# Patient Record
Sex: Female | Born: 1980 | Race: Black or African American | Hispanic: No | Marital: Single | Smoking: Current every day smoker
Health system: Southern US, Academic
[De-identification: ages and names within clinical notes are randomized; demographics above are authoritative.]

## PROBLEM LIST (undated history)

## (undated) DIAGNOSIS — I10 Essential (primary) hypertension: Secondary | ICD-10-CM

## (undated) DIAGNOSIS — R569 Unspecified convulsions: Secondary | ICD-10-CM

## (undated) DIAGNOSIS — O09293 Supervision of pregnancy with other poor reproductive or obstetric history, third trimester: Secondary | ICD-10-CM

## (undated) DIAGNOSIS — F410 Panic disorder [episodic paroxysmal anxiety] without agoraphobia: Secondary | ICD-10-CM

## (undated) DIAGNOSIS — E663 Overweight: Secondary | ICD-10-CM

## (undated) DIAGNOSIS — O99213 Obesity complicating pregnancy, third trimester: Secondary | ICD-10-CM

## (undated) DIAGNOSIS — F419 Anxiety disorder, unspecified: Secondary | ICD-10-CM

## (undated) DIAGNOSIS — Z789 Other specified health status: Secondary | ICD-10-CM

## (undated) DIAGNOSIS — F1910 Other psychoactive substance abuse, uncomplicated: Secondary | ICD-10-CM

## (undated) HISTORY — DX: Other psychoactive substance abuse, uncomplicated: F19.10

## (undated) HISTORY — DX: Other specified health status: Z78.9

## (undated) HISTORY — DX: Anxiety disorder, unspecified: F41.9

## (undated) HISTORY — PX: CYST REMOVAL: SHX22

## (undated) HISTORY — DX: Unspecified convulsions: R56.9

## (undated) HISTORY — DX: Obesity complicating pregnancy, third trimester: O99.213

## (undated) HISTORY — PX: HX BREAST BIOPSY: SHX20

## (undated) HISTORY — DX: Supervision of pregnancy with other poor reproductive or obstetric history, third trimester: O09.293

## (undated) HISTORY — DX: Overweight: E66.3

## (undated) HISTORY — PX: HX CYST INCISION AND DRAINAGE: SHX14

---

## 2002-05-06 ENCOUNTER — Emergency Department: Payer: Self-pay

## 2002-09-03 ENCOUNTER — Emergency Department: Payer: Self-pay

## 2004-03-22 ENCOUNTER — Ambulatory Visit: Payer: Self-pay

## 2004-03-23 ENCOUNTER — Ambulatory Visit: Payer: Self-pay

## 2004-04-13 ENCOUNTER — Ambulatory Visit: Payer: Self-pay

## 2004-04-14 ENCOUNTER — Ambulatory Visit: Payer: Self-pay

## 2004-04-17 ENCOUNTER — Emergency Department: Payer: Self-pay

## 2009-11-28 ENCOUNTER — Emergency Department: Admit: 2009-11-28 | Discharge: 2009-11-28 | Disposition: A | Discharge status: Home / Self Care | Payer: Self-pay

## 2009-12-02 ENCOUNTER — Ambulatory Visit: Admission: RE | Admit: 2009-12-02 | Payer: Self-pay | Source: Ambulatory Visit | Admitting: EXTERNAL

## 2009-12-02 ENCOUNTER — Other Ambulatory Visit: Payer: Self-pay

## 2009-12-07 LAB — HISTORICAL SURGICAL PATHOLOGY SPECIMEN

## 2010-01-13 ENCOUNTER — Ambulatory Visit: Admission: RE | Admit: 2010-01-13 | Payer: Self-pay | Source: Ambulatory Visit | Admitting: EXTERNAL

## 2010-03-08 ENCOUNTER — Emergency Department: Admit: 2010-03-08 | Discharge status: Absent without leave | Payer: Self-pay

## 2010-03-09 ENCOUNTER — Ambulatory Visit: Admission: RE | Admit: 2010-03-09 | Payer: Self-pay | Source: Ambulatory Visit

## 2010-04-24 ENCOUNTER — Emergency Department
Admit: 2010-04-24 | Discharge: 2010-04-24 | Disposition: A | Discharge status: Home / Self Care | Payer: Self-pay | Attending: EMERGENCY MEDICINE-ST JOESPH'S | Admitting: EMERGENCY MEDICINE-ST JOESPH'S

## 2010-06-21 ENCOUNTER — Emergency Department
Admit: 2010-06-21 | Discharge: 2010-06-21 | Disposition: A | Discharge status: Home / Self Care | Payer: Self-pay | Attending: EMERGENCY MEDICINE-ST JOESPH'S | Admitting: EMERGENCY MEDICINE-ST JOESPH'S

## 2010-09-09 ENCOUNTER — Emergency Department: Admit: 2010-09-09 | Discharge: 2010-09-09 | Disposition: A | Discharge status: Home / Self Care | Payer: Self-pay

## 2010-10-20 ENCOUNTER — Ambulatory Visit: Admission: RE | Admit: 2010-10-20 | Payer: Self-pay | Source: Ambulatory Visit | Admitting: EXTERNAL

## 2010-10-20 ENCOUNTER — Other Ambulatory Visit: Payer: Self-pay

## 2010-10-21 ENCOUNTER — Emergency Department: Admit: 2010-10-21 | Discharge: 2010-10-21 | Disposition: A | Discharge status: Home / Self Care | Payer: Self-pay

## 2010-10-24 LAB — HISTORICAL SURGICAL PATHOLOGY SPECIMEN

## 2011-02-12 ENCOUNTER — Emergency Department: Admit: 2011-02-12 | Discharge: 2011-02-12 | Disposition: A | Discharge status: Home / Self Care | Payer: Self-pay

## 2011-02-14 ENCOUNTER — Emergency Department: Admit: 2011-02-14 | Discharge: 2011-02-14 | Disposition: A | Discharge status: Home / Self Care | Payer: Self-pay

## 2011-05-22 ENCOUNTER — Emergency Department
Admission: EM | Admit: 2011-05-22 | Disposition: A | Discharge status: Home / Self Care | Payer: Self-pay | Source: Ambulatory Visit

## 2011-06-05 ENCOUNTER — Emergency Department: Admit: 2011-06-05 | Discharge: 2011-06-05 | Disposition: A | Discharge status: Home / Self Care | Payer: Self-pay

## 2011-06-08 ENCOUNTER — Ambulatory Visit (INDEPENDENT_AMBULATORY_CARE_PROVIDER_SITE_OTHER): Payer: Self-pay | Admitting: GENERAL SURGERY

## 2011-12-04 ENCOUNTER — Emergency Department: Admit: 2011-12-04 | Disposition: A | Discharge status: Home / Self Care | Payer: Self-pay | Source: Ambulatory Visit

## 2011-12-10 ENCOUNTER — Emergency Department
Admission: EM | Admit: 2011-12-10 | Discharge: 2011-12-10 | Disposition: A | Discharge status: Home / Self Care | Payer: MEDICAID | Attending: Emergency Medicine | Admitting: Emergency Medicine

## 2011-12-10 LAB — URINALYSIS - JMC ONLY
BILIRUBIN,URINE: NEGATIVE mg/dL
BLOOD,URINE: NEGATIVE
GLUCOSE,URINE: NORMAL mg/dL
KETONE, URINE: NEGATIVE mg/dL
LEUKOCYTE ESTERACE,URINE: NEGATIVE
NITRITES,URINE: NEGATIVE
PH,URINE: 6.5 (ref 5.0–7.5)
PROTEIN,URINE: NEGATIVE mg/dL
SPECIFIC GRAVITY,URINE: 1.015 (ref 1.005–1.020)
UROBILINOGEN,URINE: NORMAL mg/dL (ref 0.2–1.0)

## 2011-12-10 LAB — CBC
BASOPHIL #: 0.15 K/uL — ABNORMAL HIGH (ref 0.0–0.10)
BASOPHILS %: 2.3 % (ref 0–2.50)
EOSINOPHIL #: 0.18 K/uL (ref 0.00–0.50)
EOSINOPHIL %: 2.8 % (ref 0.0–5.2)
HCT: 36.9 % (ref 36.0–48.0)
HGB: 11.5 g/dL — ABNORMAL LOW (ref 12.0–16.0)
LYMPHOCYTE #: 3.16 K/uL (ref 0.7–3.20)
LYMPHOCYTE %: 48.3 % — ABNORMAL HIGH (ref 15.0–43.0)
MCH: 21.6 pg — ABNORMAL LOW (ref 28.3–34.3)
MCHC: 31.2 g/dL — ABNORMAL LOW (ref 32.0–36.0)
MCV: 69.2 fL — AB (ref 82.0–100.0)
MONOCYTE #: 0.52 K/uL (ref 0.20–0.90)
MONOCYTE %: 8 % (ref 4.8–12.0)
MPV: 8.1 fL (ref 7.4–10.45)
PLATELET COUNT: 323 K/uL (ref 150–400)
PMN #: 2.53 K/uL (ref 1.5–6.5)
PMN %: 38.7 % — ABNORMAL LOW (ref 43.0–76.0)
RBC: 5.33 M/uL (ref 4.0–5.6)
RDW: 15.4 % — AB (ref 11.0–16.0)
WBC: 6.5 K/uL — AB (ref 4.0–11.0)

## 2011-12-10 LAB — BASIC METABOLIC PROFILE - BMC/JMC ONLY
ANION GAP: 6 mmol/L (ref 3–11)
BUN: 12 mg/dL (ref 6–20)
CALCIUM: 10.1 mg/dL (ref 8.6–10.3)
CARBON DIOXIDE: 25 mmol/L (ref 22–32)
CHLORIDE: 103 mmol/L (ref 101–111)
CREATININE: 0.51 mg/dL (ref 0.44–1.00)
ESTIMATED GLOMERULAR FILTRATION RATE: >60 mL/min (ref >60)
GLUCOSE: 88 mg/dL (ref 70–110)
POTASSIUM: 4.4 mmol/L (ref 3.4–5.1)
SODIUM: 134 mmol/L — ABNORMAL LOW (ref 136–145)

## 2011-12-10 LAB — RBC MORPHOLOGY - JMC ONLY: PLATELET ESTIMATE: ADEQUATE

## 2011-12-10 LAB — HCG,QUANT - JMC ONLY: HCG QUANT: 12751 m[IU]/mL — ABNORMAL HIGH (ref <5)

## 2011-12-10 LAB — LIPASE: LIPASE: 23 U/L (ref 22–51)

## 2011-12-10 LAB — HCG QUAL URINE - JMC ONLY: HCG QUAL: POSITIVE — AB

## 2011-12-10 MED ORDER — SODIUM CHLORIDE 0.9 % (FLUSH) INJECTION SYRINGE
10.0000 mL | INJECTION | Freq: Once | INTRAMUSCULAR | Status: DC
Start: 2011-12-10 — End: 2011-12-10

## 2011-12-10 MED ORDER — SODIUM CHLORIDE 0.9 % INTRAVENOUS SOLUTION
INTRAVENOUS | Status: DC
Start: 2011-12-10 — End: 2011-12-10

## 2011-12-10 NOTE — ED Nurses Note (Signed)
Ambulated to room # 3. Report/care to [employer].

## 2011-12-10 NOTE — ED Provider Notes (Signed)
HPI Comments: PT C/O 3 DAYS HX PESSURE IN SDES OF ABD. PT STATES + HOME PREG. APPOINT. WITH DR Larene Beach ON 19TH. NO VAGINAL BLEED/D/C    Patient is a 30 y.o. female presenting with abdominal pain. The history is provided by the patient.   Abdominal Pain   This is a new problem. The current episode started more than 2 days ago (x 3 days). The problem has not changed since onset.Pain location: "Along sides" The quality of the pain is pressure-like. The pain is at a severity of 6/10. The pain is moderate. Pertinent negatives include fever, diarrhea, nausea, vomiting, dysuria and headaches. The symptoms are aggravated by certain positions. The symptoms are relieved by certain positions.     30 Y/O FEMALE C/P OF ABDOMINAL "PRESSURE" X 3 DAYS. PT STS THAT SHE FEELS PRESSURE AND "PULLING PAIN" ALONG THE SIDES OF HER ABDOMEN. PT STS THAT THE PAIN IS WORSENED BY COUGHING, DEEP BREATHING, AND BY CERTAIN POSITIONS. PT STS THAT SHE IS PREGNANT BUT HAS NOT YET SEEN AN OBGYN, PT REPORTS THAT HER LAST PERIOD WAS ON 11/7. PT DENIES ANY VAGINAL BLEEDING OR DISCHARGE. PT ALSO DENIES ANY FEVER, NAUSEA, VOMITING, DIARRHEA, OR DYSURIA.     Pt is [id number]    Review of Systems   Constitutional: Negative for fever and diaphoresis.   HENT: Positive for congestion (Moderate.). Negative for sore throat and neck pain.    Respiratory: Positive for cough (Moderate.). Negative for shortness of breath.    Cardiovascular: Negative for chest pain.   Gastrointestinal: Positive for abdominal pain (Moderate. x 3 days. "pressure-like"). Negative for nausea, vomiting and diarrhea.   Genitourinary: Negative for dysuria, vaginal bleeding and vaginal discharge. Flank pain: Moderate. x 3 days. Bilateral sides. "Pressure-like.   Musculoskeletal: Negative for back pain.   Skin: Negative for rash.   Neurological: Negative for dizziness, syncope, weakness, light-headedness and headaches.   Psychiatric/Behavioral: Negative for confusion.    All other systems reviewed and are negative.      Past Medical History:  History reviewed. No pertinent past medical history.    Allergies:  Penicillins and Sulfa (sulfonamides)    Past Surgical History:  Past Surgical History   Procedure Date   . Hx breast biopsy        Social History:  History   Substance Use Topics   . Smoking status: Not on file   . Smokeless tobacco: Not on file   . Alcohol Use: No     History   Drug Use No       Family History:  No family history on file.    Home Meds: The patient's home medications have been reviewed.    Physical Exam  BP 138/87   Pulse 81   Temp 37 C (98.6 F)   Resp 20   Ht 1.702 m (5\' 7" )   Wt 86.183 kg (190 lb)   BMI 29.76 kg/m2   SpO2 99%   LMP 11/01/2011  Constitutional:  Well developed, well nourished.  Awake & alert. No distress.  Head:  Atraumatic.  Normocephalic.    Eyes:  PERRL.  EOMI.  Conjunctivae are not pale.  ENT:  Mucous membranes are moist and intact.  Oropharynx is clear and symmetric.  Patent airway.  Neck:  Supple.  Full ROM.  No JVD.  No lymphadenopathy.  Cardiovascular:  Regular rate.  Regular rhythm.  No murmurs, rubs, or gallops.  Distal pulses are 2+ and symmetric.  Pulmonary/Chest:  No evidence of respiratory distress.  Clear to auscultation bilaterally.  No wheezing, rales or rhonchi. Chest non-tender.  Abdominal:  Soft and non-distended.  There is mild bilateral lower pelvic pain.  No rebound, guarding, or rigidity.  No organomegaly.  Good bowel sounds.  GU: No blood to vault; No uterine or adnexal TTP    Back:  No CVA tenderness. FROM.   Extremities:  No edema.   No cyanosis.  No clubbing.  Full range of motion in all extremities.  No calf tenderness.  Skin:  Skin is warm and dry.  No diaphoresis. No rash.   Neurological:  Alert, awake, and appropriate.  Normal speech.  Sensation normal. Motor strengths 5/5.    Psychiatric:  Good eye contact.  Normal interaction, affect, and behavior.      Course   Results for orders placed during the hospital encounter of 12/10/11 (from the past 12 hour(s))   UA W/REFLEX - [facility] ONLY       Component Value Range    SOURCE, URINE CC      COLOR, URINE yellow  YELLOW    APPEARANCE,URINE clear  CLEAR    GLUCOSE,URINE normal  NEGATIVE mg/dL    BILIRUBIN,URINE negative  NEGATIVE mg/dL    KETONE, URINE negative  NEGATIVE mg/dL    SPECIFIC GRAVITY,URINE 1.015  1.005 - 1.020    BLOOD,URINE negative  NEGATIVE    PH,URINE 6.5  5.0 - 7.5    PROTEIN,URINE negative  NEGATIVE mg/dL    UROBILINOGEN,URINE normal  0.2 - 1.0 mg/dL    NITRITES,URINE negative  NEGATIVE    LEUKOCYTE ESTERACE,URINE negative  NEGATIVE   HCG QUAL URINE - [facility] ONLY       Component Value Range    HCG QUAL POSITIVE (*) NEGATIVE   CBC       Component Value Range    WBC 6.5 (*) 4.0 - 11.0 K/uL    RBC 5.33  4.0 - 5.6 M/uL    HGB 11.5 (*) 12.0 - 16.0 g/dL    HCT 16.1  09.6 - 04.5 %    MCV 69.2 (*) 82.0 - 100.0 fL    MCH 21.6 (*) 28.3 - 34.3 pg    MCHC 31.2 (*) 32.0 - 36.0 g/dL    RDW 40.9 (*) 81.1 - 16.0 %    PLATELET COUNT 323 (*) 150 - 400 K/uL    MPV 8.1  7.4 - 10.45 fL    PMN % 38.7 (*) 43.0 - 76.0 %    LYMPHOCYTE % 48.3 (*) 15.0 - 43.0 %    MONOCYTE % 8.0  4.8 - 12.0 %    EOSINOPHIL % 2.8  0.0 - 5.2 %    BASOPHILS % 2.3  0 - 2.50 %    PMN # 2.53  1.5 - 6.5 K/uL    LYMPHOCYTE # 3.16  0.7 - 3.20 K/uL    MONOCYTE # 0.52  0.20 - 0.90 K/uL    EOSINOPHIL # 0.18  0.00 - 0.50 K/uL    BASOPHIL # 0.15 (*) 0.0 - 0.10 K/uL   LIPASE       Component Value Range    LIPASE 23  22 - 51 U/L   HCG,QUANT - [facility] ONLY       Component Value Range    HCG QUANT 12751 (*) <5 mIU/mL   BASIC METABOLIC PROFILE - [facility] ONLY       Component Value Range    GLUCOSE 88  70 - 110 mg/dL    BUN 12  6 - 20 mg/dL    CREATININE 1.61  0.96 - 1.00 mg/dL    ESTIMATED GLOMERULAR FILTRATION RATE > 60  >60 ml/min    SODIUM 134 (*) 136 - 145 mmol/L    POTASSIUM 4.4  3.4 - 5.1 mmol/L    CHLORIDE 103  101 - 111 mmol/L    CARBON DIOXIDE 25  22 - 32 mmol/L     ANION GAP 6  3 - 11 mmol/L    CALCIUM 10.1  8.6 - 10.3 mg/dL   RBC MORPHOLOGY - [facility] ONLY       Component Value Range    PLATELET ESTIMATE ADEQUATE  ADEQUATE    HYPO 1+      MICROCYTOSIS 1+      TARGET CELLS 1+         ---------------------- MEDICAL DECISION MAKING -----------------------      Vital Signs: Reviewed the patient?s vital signs.   Pulse oximetry interpretation: 99% room air     Nursing Notes: Reviewed and utilized the nursing notes.      Old Medical Records: Reviewed    Laboratory Studies: Ordered and independently interpreted laboratory tests, see above.     Additional MDM and Provider Notes:   1722 - Patient comfortable and asymptomatic.  Will f/u with Dr. Dayton Scrape on 12/13/11    ----------------------------- COUNSELING ------------------------------     Counseling: The emergency provider has spoken with the patient and discussed today?s findings, in addition to providing specific details for the plan of care.  Questions are answered and there is agreement with the plan.      ----------------------- IMPRESSION AND DISPOSITION --------------------      CLINICAL IMPRESSION   First Trimester Pregnancy  Pelvic Pain - Resolved    DISPOSITION   Disposition: Home  Patient condition: Stable     SCRIBE ATTESTATION   This note is prepared by Luvenia Heller, acting as Scribe for Dr. Bluford Kaufmann     The scribe's documentation has been prepared under my direction and personally reviewed by me in its entirety.  I confirm that the note above accurately reflects all work, treatment, procedures, and medical decision making performed by me, Dr. Bluford Kaufmann

## 2011-12-10 NOTE — ED Nurses Note (Signed)
Pelvic set up at bedside earlier, assisted MD with pelvic exam patient tolerated well.  Received discharge home order, discharge papers rev'd and explained, signature obtained, pt encouraged to f/u with OB, pt states appointment on the 19th.  Pt escorted to d/c exit area

## 2011-12-10 NOTE — ED Nurses Note (Signed)
PT C/O 3 DAYS HX PESSURE IN SDES OF ABD. PT STATES + HOME PREG. APPOINT. WITH DR Larene Beach ON 19TH. NO VAGINAL BLEED/D/C

## 2011-12-13 ENCOUNTER — Encounter (INDEPENDENT_AMBULATORY_CARE_PROVIDER_SITE_OTHER): Payer: Self-pay

## 2011-12-13 ENCOUNTER — Ambulatory Visit (INDEPENDENT_AMBULATORY_CARE_PROVIDER_SITE_OTHER): Payer: MEDICAID

## 2011-12-13 VITALS — BP 148/76 | Wt 196.7 lb

## 2011-12-13 DIAGNOSIS — F1110 Opioid abuse, uncomplicated: Secondary | ICD-10-CM

## 2011-12-13 HISTORY — DX: Opioid abuse, uncomplicated: F11.10

## 2011-12-13 NOTE — Progress Notes (Signed)
 Patient was oriented to the practice. She was advised of the practice webpage and  [email or web address].. History and physical was accomplished.A thorough discussion was undertaken regarding the options for testing for aneuploidy in the first and second trimester. Please refer to the genetic screening questionnaire for additional information. Labs were ordered. Patient was advised of how to reach this office afterhours. New OB information was  provided to the patient.elevated bp noted will watch closely   Provided# for subutex  prescriber in [place]

## 2011-12-13 NOTE — Progress Notes (Signed)
Urine Results:  Urine HCG: Positive  Initials: VC    Haelyn Forgey A Taeshawn Helfman, [place] 12/13/2011, 3:54 PM

## 2011-12-14 ENCOUNTER — Other Ambulatory Visit
Admission: RE | Admit: 2011-12-14 | Discharge: 2011-12-14 | Disposition: A | Discharge status: Home / Self Care | Payer: MEDICAID

## 2011-12-14 DIAGNOSIS — Z113 Encounter for screening for infections with a predominantly sexual mode of transmission: Secondary | ICD-10-CM | POA: Insufficient documentation

## 2011-12-14 DIAGNOSIS — Z124 Encounter for screening for malignant neoplasm of cervix: Secondary | ICD-10-CM | POA: Insufficient documentation

## 2011-12-18 LAB — HISTORICAL CYTOPATHOLOGY-GYN (PAP AND HPV TESTS): Final Diagnosis: NEGATIVE

## 2011-12-20 ENCOUNTER — Other Ambulatory Visit (HOSPITAL_BASED_OUTPATIENT_CLINIC_OR_DEPARTMENT_OTHER): Payer: Self-pay

## 2011-12-24 ENCOUNTER — Emergency Department
Admission: EM | Admit: 2011-12-24 | Discharge: 2011-12-24 | Disposition: A | Discharge status: Home / Self Care | Payer: MEDICAID | Attending: Emergency Medicine | Admitting: Emergency Medicine

## 2011-12-24 DIAGNOSIS — N898 Other specified noninflammatory disorders of vagina: Secondary | ICD-10-CM | POA: Insufficient documentation

## 2011-12-24 DIAGNOSIS — O99330 Smoking (tobacco) complicating pregnancy, unspecified trimester: Secondary | ICD-10-CM | POA: Insufficient documentation

## 2011-12-24 DIAGNOSIS — O99891 Other specified diseases and conditions complicating pregnancy: Secondary | ICD-10-CM | POA: Insufficient documentation

## 2011-12-24 LAB — URINALYSIS - JMC ONLY
BILIRUBIN,URINE: NEGATIVE mg/dL
BLOOD,URINE: NEGATIVE
GLUCOSE,URINE: NORMAL mg/dL
KETONE, URINE: NEGATIVE mg/dL
LEUKOCYTE ESTERACE,URINE: NEGATIVE
NITRITES,URINE: NEGATIVE
PH,URINE: 6 (ref 5.0–7.5)
PROTEIN,URINE: NEGATIVE mg/dL
SPECIFIC GRAVITY,URINE: 1.02 (ref 1.005–1.020)
UROBILINOGEN,URINE: NORMAL mg/dL (ref 0.2–1.0)

## 2011-12-24 NOTE — ED Nurses Note (Signed)
 NAD at time of triage, VSS, denies pain

## 2011-12-24 NOTE — ED Provider Notes (Signed)
 HPI Comments: This is a 30 yo female who presents with concerns of two tiny spots on her underwear which were brown in color.  Denies vaginal bleeding; no pelvic pain;  No n/v/d.  No back pain.   States she is approximately [redacted] weeks pregnant.  Has appt with OB on 12/28/11.  Had first OB appt on 12/12/11.  Denies vaginal odor.    Patient is a 30 y.o. female presenting with vaginal discharge. The history is provided by the patient.   Vaginal Discharge   Pertinent negatives include no fever, no abdominal pain, no diarrhea, no nausea, no vomiting and no dysuria.           Review of Systems   Constitutional: Negative for fever and chills.   Respiratory: Negative for chest tightness.    Cardiovascular: Negative for chest pain.   Gastrointestinal: Negative for nausea, vomiting, abdominal pain and diarrhea.   Genitourinary: Positive for vaginal discharge. Negative for dysuria, hematuria, flank pain, vaginal bleeding and difficulty urinating.   Musculoskeletal: Negative for back pain.   Neurological: Negative.    Hematological: Negative.    Psychiatric/Behavioral: Negative.    All other systems reviewed and are negative.            History:   Past Medical History:  Past Medical History   Diagnosis Date   . Seizure      age 6    . Overweight    . 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 Hx breast biopsy    . Hx cyst incision and drainage LEFT       Social History:  History   Substance Use Topics   . Smoking status: Current Everyday Smoker -- 0.4 packs/day for 14 years     Types: Cigarettes   . Smokeless tobacco: Not on file   . Alcohol Use: No     History   Drug Use No     hx of opiate abuse  oxycodone10mg  q 4        Family History:  Family History   Problem Relation Age of Onset   . Diabetes Maternal Grandmother    . Diabetes Maternal Grandfather    . Healthy Father    . Healthy Mother    . Healthy Brother          Physical Exam   Nursing note and vitals  reviewed.  Constitutional: She is oriented to person, place, and time. She appears well-developed and well-nourished. No distress.   HENT:   Head: Normocephalic and atraumatic.   Eyes: Pupils are equal, round, and reactive to light.   Cardiovascular: Normal rate, regular rhythm and normal heart sounds.    Pulmonary/Chest: Effort normal and breath sounds normal.   Abdominal: Soft. Bowel sounds are normal. There is no tenderness.   Genitourinary: Vaginal discharge found.   Neurological: She is alert and oriented to person, place, and time.   Skin: Skin is warm and dry.   Psychiatric: She has a normal mood and affect.           ED Course:    Results:  Results for orders placed during the hospital encounter of 12/24/11 (from the past 12 hour(s))    -UA W/REFLEX - [facility] ONLY     SOURCE, URINE                 VOID  COLOR, URINE                  yellow  Range: YELLOW     APPEARANCE,URINE              clear   Range: CLEAR     GLUCOSE,URINE mg/dL           normal  Range: NEGATIVE mg/dL     BILIRUBIN,URINE mg/dL         negative Range: NEGATIVE mg/dL     KETONE, URINE mg/dL           negative Range: NEGATIVE mg/dL     SPECIFIC GRAVITY,URINE        1.020   Range: 1.005 - 1.020     BLOOD,URINE                   negative Range: NEGATIVE     PH,URINE                      6.0     Range: 5.0 - 7.5     PROTEIN,URINE mg/dL           negative Range: NEGATIVE mg/dL     UROBILINOGEN,URINE mg/dL      normal  Range: 0.2 - 1.0 mg/dL     NITRITES,URINE                negative Range: NEGATIVE     LEUKOCYTE ESTERACE,URINE      negative Range: NEGATIVE    ED Course:    Evaluation / Plan:          Dx:  Vaginal discharge / may be related to implantation  Discussed with patient.  Will follow up with OB/GYN in 3 days as scheduled.

## 2011-12-24 NOTE — Discharge Instructions (Signed)
Follow up with OB/GYN in 3 days as scheduled.  Return to ER with vaginal bleeding, or change in condition.

## 2011-12-24 NOTE — ED Nurses Note (Signed)
Pt discharged home with no Rx.  Pt verbalized understanding of discharge instructions.

## 2011-12-25 ENCOUNTER — Encounter (INDEPENDENT_AMBULATORY_CARE_PROVIDER_SITE_OTHER): Payer: Self-pay

## 2011-12-25 ENCOUNTER — Ambulatory Visit
Admission: RE | Admit: 2011-12-25 | Discharge: 2011-12-25 | Disposition: A | Discharge status: Home / Self Care | Payer: MEDICAID | Source: Ambulatory Visit

## 2011-12-25 DIAGNOSIS — Z3480 Encounter for supervision of other normal pregnancy, unspecified trimester: Secondary | ICD-10-CM | POA: Insufficient documentation

## 2011-12-27 LAB — RUBELLA VIRUS IGG AB: RUBELLA IGG: 3.35 Index (ref >=1.10)

## 2011-12-28 ENCOUNTER — Ambulatory Visit (INDEPENDENT_AMBULATORY_CARE_PROVIDER_SITE_OTHER): Payer: Self-pay

## 2011-12-28 ENCOUNTER — Encounter

## 2011-12-28 VITALS — BP 142/80 | Wt 203.9 lb

## 2011-12-28 NOTE — Progress Notes (Signed)
[place] for subutex 8mg  q d, no c/o, labs reviewed bp noted see sonogram Which revealed probable missed abortion. Will order confirmatory hospital-based sonogram.Patient desires suction D&C at first, opportunity she specifically declined Cytotec, and or conservative management. Advise call hospital for heavy vaginal bleeding or severe pain. 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 I encouraged patient  to continued Subutex.

## 2011-12-28 NOTE — Procedures (Signed)
 Transvaginal sonogram performed today due to unknown last menses. Exam today revealed a crl of  13.6mm  which equals with 7 weeks 5days  no fetal cardiac activity nor fetal movement.Ovaries were not seen .Will get formal sonogram for confirmation .

## 2011-12-29 ENCOUNTER — Other Ambulatory Visit (INDEPENDENT_AMBULATORY_CARE_PROVIDER_SITE_OTHER): Payer: Self-pay

## 2011-12-29 ENCOUNTER — Ambulatory Visit (HOSPITAL_BASED_OUTPATIENT_CLINIC_OR_DEPARTMENT_OTHER)
Admission: RE | Admit: 2011-12-29 | Discharge: 2011-12-29 | Disposition: A | Discharge status: Home / Self Care | Payer: MEDICAID | Source: Ambulatory Visit

## 2011-12-29 DIAGNOSIS — O021 Missed abortion: Secondary | ICD-10-CM | POA: Insufficient documentation

## 2011-12-29 LAB — HCG QUANT - BMC ONLY: HCG QUANT: 74949 m[IU]/mL — ABNORMAL HIGH (ref <5)

## 2012-01-03 ENCOUNTER — Ambulatory Visit
Admission: RE | Admit: 2012-01-03 | Discharge: 2012-01-03 | Disposition: A | Discharge status: Home / Self Care | Payer: MEDICAID | Source: Ambulatory Visit

## 2012-01-03 DIAGNOSIS — Z01812 Encounter for preprocedural laboratory examination: Secondary | ICD-10-CM | POA: Insufficient documentation

## 2012-01-03 DIAGNOSIS — O021 Missed abortion: Secondary | ICD-10-CM | POA: Insufficient documentation

## 2012-01-03 HISTORY — DX: Panic disorder (episodic paroxysmal anxiety): F41.0

## 2012-01-03 HISTORY — DX: Anxiety disorder, unspecified: F41.9

## 2012-01-03 HISTORY — DX: Essential (primary) hypertension: I10

## 2012-01-03 LAB — CBC
BASOPHIL #: 0.07 K/uL (ref 0.0–0.10)
BASOPHILS %: 1 % (ref 0–2.50)
EOSINOPHIL #: 0.35 K/uL (ref 0.00–0.50)
EOSINOPHIL %: 4.8 % (ref 0.0–5.2)
HCT: 36.2 % (ref 36.0–48.0)
HGB: 11.7 g/dL — ABNORMAL LOW (ref 12.0–16.0)
LYMPHOCYTE #: 2.67 K/uL (ref 0.7–3.20)
LYMPHOCYTE %: 36.8 % (ref 15.0–43.0)
MCH: 21.7 pg — ABNORMAL LOW (ref 28.3–34.3)
MCHC: 32.3 g/dL (ref 32.0–36.0)
MCV: 67 fL — ABNORMAL LOW (ref 82.0–100.0)
MONOCYTE #: 0.54 K/uL (ref 0.20–0.90)
MONOCYTE %: 7.5 % (ref 4.8–12.0)
MPV: 7.1 fL — ABNORMAL LOW (ref 7.4–10.45)
PLATELET COUNT: 300 K/uL (ref 150–400)
PMN #: 3.61 K/uL (ref 1.5–6.5)
PMN %: 49.9 % (ref 43.0–76.0)
RBC: 5.4 M/uL (ref 4.0–5.6)
RDW: 14.8 % (ref 11.0–16.0)
WBC: 7.2 K/uL (ref 4.0–11.0)

## 2012-01-03 LAB — RBC MORPHOLOGY - JMC ONLY: PLATELET ESTIMATE: ADEQUATE

## 2012-01-03 NOTE — Nurses Notes (Signed)
 Interview complete, pt arrived at hospital for interview. Pt given order for labs to be drawn. Pt to go to Dr [place] office to sign consent. Questions answered, arrival time given.

## 2012-01-04 ENCOUNTER — Inpatient Hospital Stay
Admission: RE | Admit: 2012-01-04 | Discharge: 2012-01-04 | Disposition: A | Discharge status: Home / Self Care | Payer: MEDICAID | Source: Ambulatory Visit

## 2012-01-04 ENCOUNTER — Ambulatory Visit (HOSPITAL_COMMUNITY): Payer: Self-pay

## 2012-01-04 ENCOUNTER — Ambulatory Visit (EMERGENCY_DEPARTMENT_HOSPITAL): Payer: MEDICAID

## 2012-01-04 ENCOUNTER — Other Ambulatory Visit: Payer: Self-pay

## 2012-01-04 ENCOUNTER — Encounter: Admission: RE | Disposition: A | Discharge status: Home / Self Care | Payer: Self-pay | Source: Ambulatory Visit

## 2012-01-04 DIAGNOSIS — F17200 Nicotine dependence, unspecified, uncomplicated: Secondary | ICD-10-CM | POA: Insufficient documentation

## 2012-01-04 DIAGNOSIS — F1110 Opioid abuse, uncomplicated: Secondary | ICD-10-CM | POA: Diagnosis present

## 2012-01-04 DIAGNOSIS — O021 Missed abortion: Secondary | ICD-10-CM | POA: Diagnosis present

## 2012-01-04 HISTORY — PX: HX DILATION AND CURETTAGE: SHX78

## 2012-01-04 SURGERY — DILATION AND SUCTION CURETTAGE

## 2012-01-04 MED ORDER — OXYTOCIN 10 UNIT/ML INJECTION SOLUTION
INTRAMUSCULAR | Status: AC
Start: 2012-01-04 — End: 2012-01-04
  Filled 2012-01-04: qty 2

## 2012-01-04 MED ORDER — HYDROMORPHONE 1 MG/ML INJECTION WRAPPER
INJECTION | INTRAMUSCULAR | Status: AC
Start: 2012-01-04 — End: 2012-01-04
  Filled 2012-01-04: qty 1

## 2012-01-04 MED ORDER — FENTANYL (PF) 50 MCG/ML INJECTION SOLUTION
INTRAMUSCULAR | Status: AC
Start: 2012-01-04 — End: 2012-01-04
  Filled 2012-01-04: qty 2

## 2012-01-04 MED ORDER — DOXYCYCLINE HYCLATE 100 MG CAPSULE
100.00 mg | ORAL_CAPSULE | Freq: Two times a day (BID) | ORAL | Status: AC
Start: 2012-01-04 — End: 2012-03-28

## 2012-01-04 MED ORDER — DEXAMETHASONE SODIUM PHOSPHATE 4 MG/ML INJECTION SOLUTION
INTRAMUSCULAR | Status: AC
Start: 2012-01-04 — End: 2012-01-04
  Filled 2012-01-04: qty 1

## 2012-01-04 MED ORDER — MIDAZOLAM 1 MG/ML INJECTION SOLUTION
INTRAMUSCULAR | Status: AC
Start: 2012-01-04 — End: 2012-01-04
  Filled 2012-01-04: qty 2

## 2012-01-04 MED ORDER — ONDANSETRON HCL (PF) 4 MG/2 ML INJECTION SOLUTION
INTRAMUSCULAR | Status: AC
Start: 2012-01-04 — End: 2012-01-04
  Filled 2012-01-04: qty 2

## 2012-01-04 MED ORDER — KETOROLAC 30 MG/ML (1 ML) INJECTION SOLUTION
INTRAMUSCULAR | Status: AC
Start: 2012-01-04 — End: 2012-01-04
  Filled 2012-01-04: qty 1

## 2012-01-04 MED ORDER — FENTANYL (PF) 50 MCG/ML INJECTION SOLUTION
25.00 ug | INTRAMUSCULAR | Status: DC | PRN
Start: 2012-01-04 — End: 2012-01-04

## 2012-01-04 MED ORDER — FENTANYL (PF) 50 MCG/ML INJECTION SOLUTION
INTRAMUSCULAR | Status: AC
Start: 2012-01-04 — End: 2012-01-04
  Filled 2012-01-04: qty 4

## 2012-01-04 MED ORDER — PROPOFOL 10 MG/ML INTRAVENOUS EMULSION
INTRAVENOUS | Status: AC
Start: 2012-01-04 — End: 2012-01-04
  Filled 2012-01-04: qty 20

## 2012-01-04 MED ORDER — IBUPROFEN 800 MG TABLET
800.00 mg | ORAL_TABLET | Freq: Three times a day (TID) | ORAL | Status: DC | PRN
Start: 2012-01-04 — End: 2013-10-25

## 2012-01-04 MED ORDER — HYDROCODONE 5 MG-ACETAMINOPHEN 325 MG TABLET
1.00 | ORAL_TABLET | ORAL | Status: DC | PRN
Start: 2012-01-04 — End: 2012-01-04

## 2012-01-04 MED ORDER — KETOROLAC 30 MG/ML (1 ML) INJECTION SOLUTION
30.00 mg | Freq: Four times a day (QID) | INTRAMUSCULAR | Status: DC
Start: 2012-01-04 — End: 2012-01-04

## 2012-01-04 MED ORDER — LACTATED RINGERS INTRAVENOUS SOLUTION
INTRAVENOUS | Status: DC
Start: 2012-01-04 — End: 2012-01-04

## 2012-01-04 MED ORDER — ONDANSETRON HCL (PF) 4 MG/2 ML INJECTION SOLUTION
4.00 mg | Freq: Once | INTRAMUSCULAR | Status: DC | PRN
Start: 2012-01-04 — End: 2012-01-04

## 2012-01-04 MED ORDER — KETOROLAC 30 MG/ML (1 ML) INJECTION SOLUTION
30.00 mg | Freq: Once | INTRAMUSCULAR | Status: DC | PRN
Start: 2012-01-04 — End: 2012-01-04
  Administered 2012-01-04: 30 mg via INTRAVENOUS

## 2012-01-04 MED ORDER — LIDOCAINE (PF) 20 MG/ML (2 %) INJECTION SOLUTION
INTRAMUSCULAR | Status: AC
Start: 2012-01-04 — End: 2012-01-04
  Filled 2012-01-04: qty 5

## 2012-01-04 SURGICAL SUPPLY — 13 items
CANNULA VACU ASP CURVE RND TIP SMRG SWVL HNDL CURT STRL DISP LF  BRKLY VCRT 8MM (SURGICAL INSTRUMENTS) ×1 IMPLANT
DISCONTINUED USE ITEM 330911 - SET TUBING 3/8IN 6FT SWVL HNDL SLIP RING MALE FIT BRKLY SAFETOUCH CRTG STRL DISP (IV TUBING & ACCESSORIES) ×1 IMPLANT
DRAPE FL CNTRL PCH DRAIN PORT FILTER SCRN 38X15IN UNDR BUTT CNVRT LF  STRL DISP SURG 27IN (PROTECTIVE PRODUCTS/GARMENTS) ×1 IMPLANT
DRESSING TELFA PAD 3X4IN ST 2132 100EA/BX (WOUND CARE SUPPLY) ×1 IMPLANT
GOWN SURG LRG L4 BRTHBL STRL LF  DISP SMARTGOWN (DGOW) ×2 IMPLANT
GOWN SURG XL STD AAMI L3 NONRE INFORCE STRL BACK SET IN SLEEV (PROTECTIVE PRODUCTS/GARMENTS) ×1 IMPLANT
PACK GYN/PERI 9165 9165 (DRAPE/PACKS/SHEETS/OR TOWEL) ×1 IMPLANT
SOLUTION POVIODINE IODINE 4OZ CS/72 /EA (WOUND CARE SUPPLY) ×1 IMPLANT
SPONGE SURG 4X4IN 16 PLY RADOPQ BAND VISTEC STRL LF  BLU WHT (WOUND CARE SUPPLY) ×1 IMPLANT
TIP SUCT MEDIVAC YANKAUER TAPER BLBS CLR STRL LF  DISP (Suction) ×1 IMPLANT
TOWEL 26X16IN COTTON BLU SAF DISP SURG STRL LF (PROTECTIVE PRODUCTS/GARMENTS) ×1 IMPLANT
TUBING SUCT CLR 12FT .25IN ARGYLE PVC NCDTV STR MALE FEMALE MLD CONN STRL LF (Suction) ×1 IMPLANT
YANKAUER SUCT TIP K80 CS/50 (Suction) ×1

## 2012-01-04 NOTE — OR PreOp (Cosign Needed)
[facility]  ANESTHESIA PRE-OP EVALUATION    MRN:  [id number]  Ellen Dillon  31 y.o.  Sex: female     Date of Service:  01/04/2012  Date:  01/04/2012    Surgeon: Moishe Spice):  Morrell Riddle, DO  Scheduled Procedure:  Procedure(s):  DILATION AND SUCTION CURETTAGE    Diagnosis/Pertinent HPI: missed ab          BP (Non-Invasive): 121/72 mmHg  Heart Rate: 83   Respiratory Rate: 16   Temperature: 37 C (98.6 F)  SpO2-1: 100 %    Past Medical History   Diagnosis Date   . Overweight    . 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 Hypertension      due to pregnancy   . Seizure      age 41    . Anxiety    . Panic attack      Past Surgical History   Procedure Date   . Hx breast biopsy      x3 cyst    . Hx cyst incision and drainage LEFT     Medications Prior to Admission     Medication    BUPRENORPHINE HCL (SUBUTEX SL)    by Sublingual route.          Meds taken today:  None      Current Facility-Administered Medications:  LR premix infusion  Intravenous Continuous     Allergies   Allergen Reactions   . Penicillins      HIVES   . Sulfa (Sulfonamides)      HIVES     History   Substance Use Topics   . Smoking status: Current Everyday Smoker -- 0.4 packs/day for 14 years     Types: Cigarettes   . Smokeless tobacco: Not on file   . Alcohol Use: No     Family History   Problem Relation Age of Onset   . Diabetes Maternal Grandmother    . Diabetes Maternal Grandfather    . Healthy Father    . Healthy Mother    . Healthy Brother          Prior Anesthesia Difficulties:  None  Family Anesthesia History of Difficulties: None  Pregnant: -  Airway/ Teeth/ Neck: MP 1, TMD > 6cm, Full ROM, No Loose Teeth  Any Additional Pertinent Findings: METS > 4, (-MI, -CVA, - GERD)    Physical Exam: General: appears in good health and moderately obese  Lungs: Clear to auscultation bilaterally.   Cardiovascular: regular rate and rhythm    Labs:I have reviewed all lab results.   Hospital Outpatient Visit on 01/03/2012   Component Date Value Range Status   . WBC 01/03/2012 7.2  4.0 - 11.0 K/uL Final   . RBC 01/03/2012 5.40  4.0 - 5.6 M/uL Final   . HGB 01/03/2012 11.7* 12.0 - 16.0 g/dL Final   . HCT 57/84/6962 36.2  36.0 - 48.0 % Final   . MCV 01/03/2012 67.0* 82.0 - 100.0 fL Final   . MCH 01/03/2012 21.7* 28.3 - 34.3 pg Final   . MCHC 01/03/2012 32.3  32.0 - 36.0 g/dL Final   . RDW 95/28/4132 14.8  11.0 - 16.0 % Final   . PLATELET COUNT 01/03/2012 300  150 - 400 K/uL Final   . MPV 01/03/2012 7.1* 7.4 - 10.45 fL Final   . PMN % 01/03/2012 49.9  43.0 - 76.0 % Final   . LYMPHOCYTE % 01/03/2012 36.8  15.0 -  43.0 % Final   . MONOCYTE % 01/03/2012 7.5  4.8 - 12.0 % Final   . EOSINOPHIL % 01/03/2012 4.8  0.0 - 5.2 % Final   . BASOPHILS % 01/03/2012 1.0  0 - 2.50 % Final   . PMN # 01/03/2012 3.61  1.5 - 6.5 K/uL Final   . LYMPHOCYTE # 01/03/2012 2.67  0.7 - 3.20 K/uL Final   . MONOCYTE # 01/03/2012 0.54  0.20 - 0.90 K/uL Final   . EOSINOPHIL # 01/03/2012 0.35  0.00 - 0.50 K/uL Final   . BASOPHIL # 01/03/2012 0.07  0.0 - 0.10 K/uL Final   . PLATELET ESTIMATE 01/03/2012 ADEQUATE  ADEQUATE Final   . POIKILOCYTOSIS 01/03/2012 2+   Final   . ANISOCYTOSIS 01/03/2012 1+   Final   . MICROCYTOSIS 01/03/2012 3+   Final   . SCHISTOCYTES 01/03/2012    Final   . TARGET CELLS 01/03/2012 1+   Final   . TEAR DROPS 01/03/2012 1+   Final   . OVALOCYTES 01/03/2012 1+   Final   Hospital Outpatient Visit on 12/29/2011   Component Date Value Range Status   . HCG QUANT 12/29/2011 16109* <5 mIU/mL Final   Hospital Outpatient Visit on 12/25/2011   Component Date Value Range Status   . ABO/RH(D) 12/25/2011 O POSITIVE   Final   . ANTIBODY SCREEN 12/25/2011 NEGATIVE   Final   . RUBELLA IGG 12/25/2011 3.35  >=1.10 Index Final   . URINE CULTURE 12/25/2011 NO GROWTH AFTER 2 DAYS INCUBATION.   Final   . HIV 1,2 12/25/2011 NONREACTIVE  NONREACTIVE Final   . HEPATITIS B SURFACE AG 12/25/2011 NONREACTIVE  NONREACTIVE Final    . NARCOTICS URINE 12/25/2011 NONE DETECTED  () Final   . AMPHETAMINE, URINE 12/25/2011 NONE DETECTED  () Final   . BARBITURATES,URINE 12/25/2011 NONE DETECTED  () Final   . PHENOTHIAZINES URINE 12/25/2011 NONE DETECTED  () Final   . TRANQUILIZERS URINE 12/25/2011 NONE DETECTED  () Final   . ANTIDEPRESSANTS,URINE 12/25/2011 NONE DETECTED  () Final   . ACETOMINOPHEN,URINE 12/25/2011 POSITIVE  () Final   . SALICYLATES URINE 12/25/2011 NONE DETECTED  () Final   . ADDITIONAL DRUGS, URINE - [facility]* 12/25/2011 REPORT* () Final   . HCV PCR 12/25/2011 <43  <43 IU/mL Final   . HEPATITIS C RNA(LOB IU/ML) 12/25/2011 <1.63  <1.63 log IU/mL Final   . HEMOGLOBIN A 12/25/2011 97.4  >96.0 % Final   . HEMOGLOBIN F 12/25/2011 0.0  <2.0 % Final   . HEMOGLOBIN A2 12/25/2011 2.6  1.8 - 3.5 % Final   . HEMOGLOBIN INTERP. 12/25/2011 REPORT  () Final   . PLEASE NOTE 12/25/2011 REPORT  () Final   Admission on 12/24/2011, Discharged on 12/24/2011   Component Date Value Range Status   . SOURCE, URINE 12/24/2011 VOID   Final   . COLOR, URINE 12/24/2011 yellow  YELLOW Final   . APPEARANCE,URINE 12/24/2011 clear  CLEAR Final   . GLUCOSE,URINE 12/24/2011 normal  NEGATIVE mg/dL Final   . BILIRUBIN,URINE 12/24/2011 negative  NEGATIVE mg/dL Final   . KETONE, URINE 12/24/2011 negative  NEGATIVE mg/dL Final   . SPECIFIC GRAVITY,URINE 12/24/2011 1.020  1.005 - 1.020 Final   . BLOOD,URINE 12/24/2011 negative  NEGATIVE Final   . PH,URINE 12/24/2011 6.0  5.0 - 7.5 Final   . PROTEIN,URINE 12/24/2011 negative  NEGATIVE mg/dL Final   . UROBILINOGEN,URINE 12/24/2011 normal  0.2 - 1.0 mg/dL Final   . NITRITES,URINE 12/24/2011 negative  NEGATIVE Final   . LEUKOCYTE ESTERACE,URINE 12/24/2011 negative  NEGATIVE Final   Hospital Outpatient Visit on 12/14/2011   Component Date Value Range Status   . CHLAMYDIA DNA,SDA,PAP VIAL 12/13/2011 Not Detected  Not Detected Final   . GC DNA SDA PAP VIAL 12/13/2011 Not Detected  Not Detected Final    Admission on 12/10/2011, Discharged on 12/10/2011   Component Date Value Range Status   . SOURCE, URINE 12/10/2011 CC   Final   . COLOR, URINE 12/10/2011 yellow  YELLOW Final   . APPEARANCE,URINE 12/10/2011 clear  CLEAR Final   . GLUCOSE,URINE 12/10/2011 normal  NEGATIVE mg/dL Final   . BILIRUBIN,URINE 12/10/2011 negative  NEGATIVE mg/dL Final   . KETONE, URINE 12/10/2011 negative  NEGATIVE mg/dL Final   . SPECIFIC GRAVITY,URINE 12/10/2011 1.015  1.005 - 1.020 Final   . BLOOD,URINE 12/10/2011 negative  NEGATIVE Final   . PH,URINE 12/10/2011 6.5  5.0 - 7.5 Final   . PROTEIN,URINE 12/10/2011 negative  NEGATIVE mg/dL Final   . UROBILINOGEN,URINE 12/10/2011 normal  0.2 - 1.0 mg/dL Final   . NITRITES,URINE 12/10/2011 negative  NEGATIVE Final   . LEUKOCYTE ESTERACE,URINE 12/10/2011 negative  NEGATIVE Final   . WBC 12/10/2011 6.5* 4.0 - 11.0 K/uL Final   . RBC 12/10/2011 5.33  4.0 - 5.6 M/uL Final   . HGB 12/10/2011 11.5* 12.0 - 16.0 g/dL Final   . HCT 16/09/9603 36.9  36.0 - 48.0 % Final   . MCV 12/10/2011 69.2* 82.0 - 100.0 fL Final   . MCH 12/10/2011 21.6* 28.3 - 34.3 pg Final   . MCHC 12/10/2011 31.2* 32.0 - 36.0 g/dL Final   . RDW 54/08/8118 15.4* 11.0 - 16.0 % Final   . PLATELET COUNT 12/10/2011 323* 150 - 400 K/uL Final   . MPV 12/10/2011 8.1  7.4 - 10.45 fL Final   . PMN % 12/10/2011 38.7* 43.0 - 76.0 % Final   . LYMPHOCYTE % 12/10/2011 48.3* 15.0 - 43.0 % Final   . MONOCYTE % 12/10/2011 8.0  4.8 - 12.0 % Final   . EOSINOPHIL % 12/10/2011 2.8  0.0 - 5.2 % Final   . BASOPHILS % 12/10/2011 2.3  0 - 2.50 % Final   . PMN # 12/10/2011 2.53  1.5 - 6.5 K/uL Final   . LYMPHOCYTE # 12/10/2011 3.16  0.7 - 3.20 K/uL Final   . MONOCYTE # 12/10/2011 0.52  0.20 - 0.90 K/uL Final   . EOSINOPHIL # 12/10/2011 0.18  0.00 - 0.50 K/uL Final   . BASOPHIL # 12/10/2011 0.15* 0.0 - 0.10 K/uL Final   . LIPASE 12/10/2011 23  22 - 51 U/L Final   . HCG QUAL 12/10/2011 POSITIVE* NEGATIVE Final   . HCG QUANT 12/10/2011 12751* <5 mIU/mL Final    . GLUCOSE 12/10/2011 88  70 - 110 mg/dL Final   . BUN 14/78/2956 12  6 - 20 mg/dL Final   . CREATININE 21/30/8657 0.51  0.44 - 1.00 mg/dL Final   . ESTIMATED GLOMERULAR FILTRATION RA* 12/10/2011 > 60  >60 ml/min Final   . SODIUM 12/10/2011 134* 136 - 145 mmol/L Final   . POTASSIUM 12/10/2011 4.4  3.4 - 5.1 mmol/L Final   . CHLORIDE 12/10/2011 103  101 - 111 mmol/L Final   . CARBON DIOXIDE 12/10/2011 25  22 - 32 mmol/L Final   . ANION GAP 12/10/2011 6  3 - 11 mmol/L Final   . CALCIUM 12/10/2011 10.1  8.6 - 10.3 mg/dL Final   .  PLATELET ESTIMATE 12/10/2011 ADEQUATE  ADEQUATE Final   . HYPO 12/10/2011 1+   Final   . MICROCYTOSIS 12/10/2011 1+   Final   . TARGET CELLS 12/10/2011 1+   Final         EKG: -  NPO Status: > 12 hours  ASA Class: 3      Anesthesia Plan Discussed: GA c/ LMA    Pre-Induction Evaluation and Informed Consent: Patient was seen and evaluated immediately prior to the induction of anesthesia.  Will proceed with plan as above or make the following changes.  Risks, benefits and alternatives discussed.      Amaryllis Dyke, CRNA 01/04/2012, 9:05 AM

## 2012-01-04 NOTE — OR PostOp (Signed)
Family updated in [facility], Dr Dayton Scrape speaking with patient

## 2012-01-04 NOTE — OR Surgeon (Signed)
[facility]                                                     OPERATIVE NOTE    Patient Name: [facility] Number: [id number]  Date of Service: 01/04/2012   Date of Birth: [birth year]      Pre-Operative Diagnosis: missed ab    Post-Operative Diagnosis:missed abortion   Procedure(s)/Description:  Procedure(s):  DILATION AND SUCTION CURETTAGE with sonogram     Findings: See Below     Attending Surgeon: Morrell Riddle, DO     Assistant:none     Anesthesia:general     Estimated Blood Loss:    Specimen: products of conception     Drain:none                 Complications:  None            Disposition: PACU           Condition: STABLE    Time out performed prior the start of this procedure.    Procedure:The patient was placed under general anesthesia without a difficulty from the supine position. She was then placed in the dorsal lithotomy position. She was sterilely prepped and draped in the usual manner. Sonogram was performed intraoperatively which confirmed the diagnosis of a missed abortion. Speculum was then placed in vagina. Cervix was visualized and grasped anteriorly with a single-tooth tenaculum. Cervix was then dilated with Hank dilators to facilitate a #8 curved suction curette. IV Pitocin was administered via anesthesia staff. Suction curettage was then undertaken until a good uterine cry was noted. A good endometrial stripe was noted via sonogram. Sharp curettage was then undertaken. Suction curettage was then again accomplished yielding minimal products. All specimens were sent to pathology. Bleeding was very minimal. Tenaculum removed. Site was hemostatic. Speculum removed. Sponge, lap, needle, instrument counts were correct after per OR  staff. Patient is stable when  taken back to PACU.        Morrell Riddle, DO 01/04/2012, 10:16 AM

## 2012-01-04 NOTE — OR Nursing (Signed)
Family updated by Jacarra Bobak, RN

## 2012-01-04 NOTE — Summary of Procedures (Signed)
 Below is a list of major procedures performed during your hospital stay and a summary of results:       Suction d and c

## 2012-01-04 NOTE — OR Nursing (Signed)
Family updated by Carmelina Peal, RN

## 2012-01-04 NOTE — H&P (Signed)
CC:  Missed abortion     HPI:  Ellen Dillon is a 31 y.o. female here for a suction d and c for a missed abortion .This dx was confirmed by sonogram the patient refused all other options of tx. All questions answered.       Current Facility-Administered Medications   Medication   . LR premix infusion           PMH:  Past Medical History   Diagnosis Date   . Overweight    . 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 Hypertension      due to pregnancy   . Seizure      age 73    . Anxiety    . Panic attack        (Not in an outpatient encounter)  Allergies   Allergen Reactions   . Penicillins      HIVES   . Sulfa (Sulfonamides)      HIVES         PSH:  Past Surgical History   Procedure Date   . Hx breast biopsy      x3 cyst    . Hx cyst incision and drainage LEFT       Social History:  History     Social History   . Marital Status: Single     Spouse Name: N/A     Number of Children: N/A   . Years of Education: N/A     Occupational History   . Not on file.     Social History Main Topics   . Smoking status: Current Everyday Smoker -- 0.4 packs/day for 14 years     Types: Cigarettes   . Smokeless tobacco: Not on file   . Alcohol Use: No   . Drug Use: No      hx of opiate abuse  oxycodone10mg  q 4    . Sexually Active: Yes -- Female partner(s)     Other Topics Concern   . Ability To Walk 2 Flight Of Steps Without Sob/Cp Yes   . Ability To Do Own Adl's Yes     Social History Narrative   . No narrative on file       Family History:  Family History   Problem Relation Age of Onset   . Diabetes Maternal Grandmother    . Diabetes Maternal Grandfather    . Healthy Father    . Healthy Mother    . Healthy Brother        ROS:   Constitutional: negative, Eyes: negative, Ears, nose, mouth, throat, and face: negative, Respiratory: negative, Cardiovascular: negative, Gastrointestinal: negative, Genitourinary:negative, Integument/breast: negative, Hematologic/lymphatic: negative, Musculoskeletal:negative, Neurological: negative, Behavioral/Psych: negative, Endocrine: negative and Allergic/Immunologic: negative    Physical Exam:   Vitals: BP 121/72   Pulse 83   Temp 37 C (98.6 F)   Resp 16   SpO2 100%   LMP 10/27/2011  General: no distress  Eyes: Pupils equal and round.   HENT:Mouth mucous membranes moist.   Neck: supple, symmetrical, trachea midline and thyroid: not enlarged, symmetric, no tenderness/mass/nodules  Lungs: clear to auscultation bilaterally.   Cardiovascular:    Heart regular rate and rhythm  Abdomen: soft, non-tender, non-distended, no hepatosplenomegaly, no hepatosplenomegaly and no masses  Extremities: no cyanosis or edema  Skin: Skin warm and dry and No lesions  Neurologic: CN II - XII grossly intact   Lymphatics: no lymphadenopathy  Psychiatric: normal affect, behavior, memory, thought content, judgement, and speech.  Pelvic exam:Chaperoned by nurse uterus nt no blood in vagina cx closed size is 6 weeks     Assessment  Missed abortion       Plan suction d and c       All diagnoses and treatment options were discussed. Risks, options, benefits, alternatives, and possible complications of the proposed procedures/studies/medical interventions were discussed and understood and accepted by the patient.         Morrell Riddle, DO 01/04/2012, 8:49 AM

## 2012-01-04 NOTE — OR PostOp (Cosign Needed)
[facility]  ANESTHESIA POSTOP EVALUATION NOTE    01/04/2012    Vitals -   BP 145/78   Pulse 88   Temp 36.6 C (97.8 F)   Resp 10   SpO2 100%   LMP 10/27/2011        Patient should be sufficiently recovered from the effects of anesthesia to partcipate in the evaluation or has returned to their pre-procedure level.    I have reviewed and evaluated the following:  Respiratory Function:  Patent Airway  Cardiovascular Function:  VSS, Baseline  Mental Status: Awake and Oriented  X3, FC's  Pain: No Pain  Nausea and Vomiting:  Absent    Post operative complications: None  Comment/ re-evaluation for any variations:  Report given to Efraim Kaufmann, RN      Amaryllis Dyke, CRNA 01/04/2012, 10:23 AM

## 2012-01-04 NOTE — Nurses Notes (Signed)
1105     Pt arrived   In  Room  312  Via stretcher   In supine position.   Awake and alert.   Peri pad dry.   No complaints at present. Family with pt.

## 2012-01-05 ENCOUNTER — Other Ambulatory Visit (HOSPITAL_BASED_OUTPATIENT_CLINIC_OR_DEPARTMENT_OTHER): Payer: Self-pay

## 2012-01-08 LAB — HISTORICAL SURGICAL PATHOLOGY SPECIMEN

## 2012-01-09 ENCOUNTER — Other Ambulatory Visit (HOSPITAL_BASED_OUTPATIENT_CLINIC_OR_DEPARTMENT_OTHER): Payer: Self-pay

## 2012-01-18 ENCOUNTER — Telehealth (INDEPENDENT_AMBULATORY_CARE_PROVIDER_SITE_OTHER): Payer: Self-pay

## 2012-01-18 ENCOUNTER — Encounter (INDEPENDENT_AMBULATORY_CARE_PROVIDER_SITE_OTHER): Payer: MEDICAID

## 2012-01-18 NOTE — Telephone Encounter (Signed)
Patient was scheduled for a post op visit today 01/18/12 and we had to bump your schedule I rescheduled her for your next available which is 01/29/12 is this date okay or would you like for me to do an overbook next week. MRN is [id number]. Thanks

## 2012-01-19 NOTE — Telephone Encounter (Signed)
Called Ellen Dillon back gave her this information that the Feb. 4, 2013 is fine.

## 2012-01-29 ENCOUNTER — Ambulatory Visit (INDEPENDENT_AMBULATORY_CARE_PROVIDER_SITE_OTHER): Payer: MEDICAID

## 2012-01-29 ENCOUNTER — Encounter (INDEPENDENT_AMBULATORY_CARE_PROVIDER_SITE_OTHER): Payer: Self-pay

## 2012-01-29 VITALS — BP 108/68 | Wt 211.9 lb

## 2012-01-29 NOTE — Progress Notes (Signed)
Chief Complaint   Patient presents with   . Follow-up     SAB - D&C         History of chief complaint: This is 31 y.o. female who presents Today after having a suction dilation and curettage secondary to a missed AB. Patient reports no problems postoperatively. Patient reports no heavy bleeding. subsequent to surgery.Patient is still smoking. Patient is on some suboxone therapy currently.    History reviewed and updated.Pathology reveals benign products of conception.     has a past medical history of Overweight; Opiate abuse, continuous (12/13/2011); Hypertension; Seizure; Anxiety; and Panic attack.  Patient Active Problem List   Diagnoses   (none) - all problems resolved or deleted       Past Surgical History:    has past surgical history that includes breast biopsy; cyst incision and drainage (LEFT); and dilation and curettage (01/04/12 ).    Past Social History:    reports that she has been smoking Cigarettes.  She has a 5.6 pack-year smoking history. She does not have any smokeless tobacco history on file. She reports that she does not drink alcohol or use illicit drugs.    Family History:  family history includes Diabetes in her maternal grandfather and maternal grandmother and Healthy in her brother, father, and mother.        Current Outpatient Prescriptions   Medication Sig   . buprenorphine-naloxone (SUBOXONE) 8-2 mg Sublingual Tablet, Sublingual 8 mg by Sublingual route Once a day.   . Ibuprofen (MOTRIN) 800 mg Oral Tablet take 1 Tab by mouth Three times a day as needed for Pain.   Marland Kitchen doxycycline hyclate (VIBRAMYCIN) 100 mg Oral Capsule take 1 Cap by mouth Twice daily for 84 days.   Marland Kitchen DISCONTD: BUPRENORPHINE HCL (SUBUTEX SL) by Sublingual route.        Allergies:  is allergic to penicillins and sulfa (sulfonamides).     Review of systems: The patient denies fever, chills, other systems are negative other than above.     BP 108/68   Wt 96.117 kg (211 lb 14.4 oz)   LMP 10/27/2011 Patient's last menstrual period was 10/27/2011.  General alert and oriented x3. No apparent distress.    Abdomen soft, nontender. No rebound no guarding        1. Postop check (V67.00)          Advised patient to discontinue smoking. Prenatal vitamins q.d.    Plan: Follow up P.r.n.     All diagnoses and treatment options were discussed. Risks, options, benefits, alternatives, and possible complications of the proposed procedures/studies/medical interventions were discussed and understood and accepted by the patient.     Morrell Riddle, DO 01/29/2012, 3:57 PM

## 2012-02-20 LAB — URINE CULTURE: URINE CULTURE: NO GROWTH

## 2012-02-20 LAB — TOXICOLOGY SCREEN,URINE (SEND OUT)
ACETOMINOPHEN,URINE: POSITIVE
AMPHETAMINE, URINE: NOT DETECTED
ANTIDEPRESSANTS,URINE: NOT DETECTED
BARBITURATES,URINE: NOT DETECTED
NARCOTICS URINE: NOT DETECTED
PHENOTHIAZINES URINE: NOT DETECTED
SALICYLATES URINE: NOT DETECTED
TRANQUILIZERS URINE: NOT DETECTED

## 2012-02-20 LAB — HEPATITIS C VIRUS (HCV) RNA DETECTION AND QUANTIFICATION, PCR, PLASMA
HCV PCR: <43 [IU]/mL (ref <43)
HEPATITIS C RNA(LOB IU/ML): <1.63 {Log_IU}/mL (ref <1.63)

## 2012-02-20 LAB — HIV 1,2: HIV 1,2: NONREACTIVE

## 2012-02-20 LAB — HEPATITIS B SURFACE ANTIGEN: HEPATITIS B SURFACE AG: NONREACTIVE

## 2012-02-20 LAB — HEMOGLOBIN ELECTROPHORESIS CASCADE, BLOOD

## 2012-03-15 ENCOUNTER — Encounter (INDEPENDENT_AMBULATORY_CARE_PROVIDER_SITE_OTHER): Payer: Self-pay

## 2012-03-15 NOTE — Progress Notes (Signed)
 Michelle from Dr [facility] office made courtesy call to the office to advise that he is currently treating pt with Suboxone  and pt is also postive for benzos as well. Pt advised that she is currently actively trying to get pregnant. They do not believe that it would be in the best interest of the pt at this time.

## 2012-03-18 ENCOUNTER — Ambulatory Visit (HOSPITAL_BASED_OUTPATIENT_CLINIC_OR_DEPARTMENT_OTHER): Payer: MEDICAID

## 2012-03-18 ENCOUNTER — Other Ambulatory Visit (HOSPITAL_BASED_OUTPATIENT_CLINIC_OR_DEPARTMENT_OTHER): Payer: Self-pay | Admitting: FAMILY PRACTICE

## 2012-03-18 ENCOUNTER — Ambulatory Visit (HOSPITAL_BASED_OUTPATIENT_CLINIC_OR_DEPARTMENT_OTHER)
Admission: RE | Admit: 2012-03-18 | Discharge: 2012-03-18 | Disposition: A | Discharge status: Home / Self Care | Payer: MEDICAID | Source: Ambulatory Visit | Attending: FAMILY PRACTICE | Admitting: FAMILY PRACTICE

## 2012-03-18 DIAGNOSIS — Z3200 Encounter for pregnancy test, result unknown: Secondary | ICD-10-CM | POA: Insufficient documentation

## 2012-03-18 DIAGNOSIS — M545 Low back pain, unspecified: Secondary | ICD-10-CM | POA: Insufficient documentation

## 2012-03-18 LAB — HCG, URINE QUALITATIVE, PREGNANCY: PREGNANCY, URINE: NEGATIVE m[IU]/mL

## 2013-10-25 ENCOUNTER — Emergency Department (HOSPITAL_BASED_OUTPATIENT_CLINIC_OR_DEPARTMENT_OTHER)
Admission: EM | Admit: 2013-10-25 | Discharge: 2013-10-25 | Disposition: A | Discharge status: Home / Self Care | Payer: MEDICAID | Attending: Emergency Medicine | Admitting: Emergency Medicine

## 2013-10-25 ENCOUNTER — Emergency Department (HOSPITAL_BASED_OUTPATIENT_CLINIC_OR_DEPARTMENT_OTHER): Payer: MEDICAID

## 2013-10-25 ENCOUNTER — Encounter (HOSPITAL_BASED_OUTPATIENT_CLINIC_OR_DEPARTMENT_OTHER): Payer: Self-pay

## 2013-10-25 DIAGNOSIS — Y92009 Unspecified place in unspecified non-institutional (private) residence as the place of occurrence of the external cause: Secondary | ICD-10-CM | POA: Insufficient documentation

## 2013-10-25 DIAGNOSIS — M79609 Pain in unspecified limb: Secondary | ICD-10-CM | POA: Insufficient documentation

## 2013-10-25 DIAGNOSIS — W010XXA Fall on same level from slipping, tripping and stumbling without subsequent striking against object, initial encounter: Secondary | ICD-10-CM | POA: Insufficient documentation

## 2013-10-25 MED ORDER — DIPHTH,PERTUSSIS(ACELL),TETANUS 2.5 LF UNIT-8 MCG-5 LF/0.5 ML IM SUSP
0.5000 mL | INTRAMUSCULAR | Status: AC
Start: 2013-10-25 — End: 2013-10-25
  Administered 2013-10-25: 0.5 mL via INTRAMUSCULAR
  Filled 2013-10-25: qty 0.5

## 2013-10-25 NOTE — ED Nurses Note (Signed)
Xray to call when back from ICU.

## 2013-10-25 NOTE — ED Nurses Note (Addendum)
 Sling was applied to right arm with instruction of use, pt continues to move arm in wrong position, explained to pt the importance of positioning, pt verbalizes understandin. Written and verbal discharge instructions given to pt, they verbalize understanding. Pt ambulated to the exit.

## 2013-10-25 NOTE — ED Nurses Note (Signed)
Vaccination info and vaccine card given to pt.

## 2013-10-25 NOTE — ED Nurses Note (Signed)
 Pt states she was going to the store this morning, she tripped over a ladder in the yard and fell to the concrete. Denies LOC. C/o right wrist pain, and left leg pain.

## 2013-10-25 NOTE — Discharge Instructions (Signed)
Concern for Scaphoid Fracture, Wrist  A fracture is a break in the bone. The bone you have broken often does not show up as a fracture on x-ray until later on in the healing phase. This bone is called the scaphoid bone. With this bone, your caregiver will often cast or splint your wrist as though it is fractured, even if a fracture is not seen on the x-ray. This is often done with wrist injuries in which there is tenderness at the base of the thumb. An x-ray at 1-3 weeks after your injury may confirm this fracture. A cast or splint is used to protect and keep your injured bone in good position for healing. The cast or splint will be on generally for about 6 to 16 weeks, depending on your health, age, the fracture location and how quickly you heal. Another name for the scaphoid bone is the navicular bone.  HOME CARE INSTRUCTIONS    To lessen the swelling and pain, keep the injured part elevated above your heart while sitting or lying down.   Apply ice to the injury for 15-20 minutes, 3-4 times per day while awake, for 2 days. Put the ice in a plastic bag and place a thin towel between the bag of ice and your cast.   If you have a plaster or fiberglass cast or splint:   Do not try to scratch the skin under the cast using sharp or pointed objects.   Check the skin around the cast every day. You may put lotion on any red or sore areas.   Keep your cast or splint dry and clean.   If you have a plaster splint:   Wear the splint as directed.   You may loosen the elastic bandage around the splint if your fingers become numb, tingle, or turn cold or blue.   If you have been put in a removable splint, wear and use as directed.   Do not put pressure on any part of your cast or splint; it may deform or break. Rest your cast or splint only on a pillow the first 24 hours until it is fully hardened.   Your cast or splint can be protected during bathing with a plastic bag. Do not lower the cast or splint into  water.   Only take over-the-counter or prescription medicines for pain, discomfort, or fever as directed by your caregiver.   If your caregiver has given you a follow up appointment, it is very important to keep that appointment. Not keeping the appointment could result in chronic pain and decreased function. If there is any problem keeping the appointment, you must call back to this facility for assistance.  SEEK IMMEDIATE MEDICAL CARE IF:    Your cast gets damaged, wet or breaks.   You have continued severe pain or more swelling than you did before the cast or splint was put on.   Your skin or nails below the injury turn blue or gray, or feel cold or numb.   You have tingling or burning pain in your fingers or increasing pain with movement of your fingers  Document Released: 12/01/2002 Document Revised: 03/04/2012 Document Reviewed: 07/30/2009  [facility] Patient Information 2014 [place], [place].  Contusion  A contusion is a deep bruise. Contusions are the result of an injury that caused bleeding under the skin. The contusion may turn blue, purple, or yellow. Minor injuries will give you a painless contusion, but more severe contusions may stay painful and swollen for  a few weeks.   CAUSES   A contusion is usually caused by a blow, trauma, or direct force to an area of the body.  SYMPTOMS    Swelling and redness of the injured area.   Bruising of the injured area.   Tenderness and soreness of the injured area.   Pain.  DIAGNOSIS   The diagnosis can be made by taking a history and physical exam. An X-ray, CT scan, or MRI may be needed to determine if there were any associated injuries, such as fractures.  TREATMENT   Specific treatment will depend on what area of the body was injured. In general, the best treatment for a contusion is resting, icing, elevating, and applying cold compresses to the injured area. Over-the-counter medicines may also be recommended for pain control. Ask your caregiver what the  best treatment is for your contusion.  HOME CARE INSTRUCTIONS    Put ice on the injured area.   Put ice in a plastic bag.   Place a towel between your skin and the bag.   Leave the ice on for 15-20 minutes, 3-4 times a day.   Only take over-the-counter or prescription medicines for pain, discomfort, or fever as directed by your caregiver. Your caregiver may recommend avoiding anti-inflammatory medicines (aspirin, ibuprofen, and naproxen) for 48 hours because these medicines may increase bruising.   Rest the injured area.   If possible, elevate the injured area to reduce swelling.  SEEK IMMEDIATE MEDICAL CARE IF:    You have increased bruising or swelling.   You have pain that is getting worse.   Your swelling or pain is not relieved with medicines.  MAKE SURE YOU:    Understand these instructions.   Will watch your condition.   Will get help right away if you are not doing well or get worse.  Document Released: 09/20/2005 Document Revised: 03/04/2012 Document Reviewed: 10/16/2011  [facility] Patient Information 2014 [place], [place].

## 2013-10-25 NOTE — ED Provider Notes (Signed)
 Newell JONELLE Blumenthal, PA-C  [facility]  Emergency Department Visit Note    Date: 10/25/2013  Primary care provider: [facility]  Means of arrival: private car  History obtained by: patient  History limited by: none      Chief Complaint: fall    History of Present Illness     Payeton Germani, date of birth 07-10-1981, is a 32 y.o. female who presents to the Emergency Department complaining of fall.    The patient went to close the door tighter this am and tripped over a ladder falling onto the concrete.  He is unsure of exactly how she landed on it.  The pain is located to the radial side of the wrist.  It does not radiate.  The pain is rated a 8/10.   She denies any head injury, neck pain, change in chronic back pain, chest pain, abdominal pain.  There is no numbness, tingling, or difficulty with range of motion.  She is unsure of her last tetanus so this will be updated.       Review of Systems     The pertinent positive and negative symptoms are as per HPI. All other systems reviewed and are negative.    Patient History      Past Medical History:  Past Medical History   Diagnosis Date   . Overweight(278.02)    . 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 Hypertension      due to pregnancy   . Seizure      age 39    . Anxiety    . Panic attack        Past Surgical History:  Past Surgical History   Procedure Laterality Date   . Hx breast biopsy       x3 cyst    . Hx cyst incision and drainage  LEFT   . Hx dilation and curettage  01/04/12      Dilation and Curettage suction        Family History:  Family History   Problem Relation Age of Onset   . Diabetes Maternal Grandmother    . Diabetes Maternal Grandfather    . Healthy Father    . Healthy Mother    . Healthy Brother        Social History:  History   Substance Use Topics   . Smoking status: Current Every Day Smoker -- 1.00 packs/day for 14 years     Types: Cigarettes   . Smokeless tobacco: Not on file   . Alcohol Use: No     History      Drug Use No     Comment: hx of opiate abuse  oxycodone10mg  q 4        Medications:  Previous Medications    BUPRENORPHINE -NALOXONE  (SUBOXONE ) 8-2 MG SUBLINGUAL TABLET, SUBLINGUAL    1.5 Tabs by Sublingual route Once a day        Allergies:   Allergies   Allergen Reactions   . Penicillins      HIVES   . Sulfa (Sulfonamides)      HIVES       Physical Exam     Vital Signs:  Filed Vitals:    10/25/13 0611   BP: 147/88   Pulse: 69   Temp: 36.9 C (98.4 F)   Resp: 17   SpO2: 99%       The initial visit vital signs are reviewed as above.  Pulse Ox: 99% on None (Room Air); interpreted by me [id number]    GENERAL:  This is a well appearing  32 y.o.  female  who is interactive, appropriate and showing no outward signs of distress.    HEAD:  Atraumatic, normocephalic.  HEENT:  Conjunctiva normal in appearance, no gross deformity noted.   NECK:  Supple, no meningeal signs.  No cervical spine tenderness.  SKIN:  Warm, good color.  An abrasion noted to the right palm.   NEURO/PSYCH:  Patient is interactive, appropriate and grossly intact.  VASCULAR: Radial and dorsalis pedis pulses are intact and symmetrical.    RIGHT WRIST:  There is pain to the right radius and the scaphoid area.  There is no pain to the ulna, metacarpals, or fingers.  She is able to flex and extend her fingers.  She can't quite approximate the thumb to her pinky due to pain.  LEFT LEG: there is pain to the left proximal tibia and fibula.  There is no pain to the patella, femur, or knee pain.  She is non tender to palpation of the ankles and hips.      Diagnostics     Labs:  No results found for any visits on 10/25/13.  Labs reviewed and interpreted by me.    Radiology:   XR WRIST RIGHT SERIES  XR TIBIA-FIBULA RIGHT  Interpreted by radiologist and independently reviewed by me.  The left tib fib x-ray was read by the radiologist and noted no acute findings.  The right wrist x-ray was read by the radiologist and noted no acute findings.    Post  splint/procedure evaluation:  The patient had a thumb spica splint applied to the right wrist by the ED tech.  This was reassessed by myself and was neurovascularly intact.     ED Progress Note/Medical Decision Making       I discussed the results with the patient as well as the treatment plan.  They had no questions or concerns.      Orders Placed This Encounter   . XR WRIST RIGHT SERIES   . XR TIBIA-FIBULA RIGHT   . diphtheria, pertussis-acell, tetanus (BOOSTRIX ) IM injection       Pre-Disposition Vitals:  Filed Vitals:    10/25/13 0611   BP: 147/88   Pulse: 69   Temp: 36.9 C (98.4 F)   Resp: 17   SpO2: 99%       Clinical Impression      1. Right wrist contusion.  2. Concern for scaphoid fracture.  3. Left leg pain.    Plan/Disposition     Discharged    Prescriptions:  New Prescriptions    No medications on file       Follow Up:  Arvell Norleen LABOR, MD  [address]  [place] [address]  [phone]    In 5 days        Condition on Disposition: Stable    Supervising physician: Dr. Wylene.

## 2014-03-10 ENCOUNTER — Encounter (HOSPITAL_BASED_OUTPATIENT_CLINIC_OR_DEPARTMENT_OTHER): Payer: Self-pay

## 2014-03-10 ENCOUNTER — Emergency Department (HOSPITAL_BASED_OUTPATIENT_CLINIC_OR_DEPARTMENT_OTHER)
Admission: EM | Admit: 2014-03-10 | Discharge: 2014-03-10 | Disposition: A | Discharge status: Home / Self Care | Payer: MEDICAID | Attending: Emergency Medicine | Admitting: Emergency Medicine

## 2014-03-10 DIAGNOSIS — F17200 Nicotine dependence, unspecified, uncomplicated: Secondary | ICD-10-CM | POA: Insufficient documentation

## 2014-03-10 DIAGNOSIS — T3995XA Adverse effect of unspecified nonopioid analgesic, antipyretic and antirheumatic, initial encounter: Secondary | ICD-10-CM | POA: Insufficient documentation

## 2014-03-10 DIAGNOSIS — Y92009 Unspecified place in unspecified non-institutional (private) residence as the place of occurrence of the external cause: Secondary | ICD-10-CM | POA: Insufficient documentation

## 2014-03-10 DIAGNOSIS — T507X5A Adverse effect of analeptics and opioid receptor antagonists, initial encounter: Secondary | ICD-10-CM | POA: Insufficient documentation

## 2014-03-10 DIAGNOSIS — T398X5A Adverse effect of other nonopioid analgesics and antipyretics, not elsewhere classified, initial encounter: Secondary | ICD-10-CM | POA: Insufficient documentation

## 2014-03-10 DIAGNOSIS — R51 Headache: Secondary | ICD-10-CM | POA: Insufficient documentation

## 2014-03-10 DIAGNOSIS — I10 Essential (primary) hypertension: Secondary | ICD-10-CM | POA: Insufficient documentation

## 2014-03-10 MED ORDER — ACETAMINOPHEN 325 MG TABLET
650.00 mg | ORAL_TABLET | ORAL | Status: AC
Start: 2014-03-10 — End: 2014-03-10
  Administered 2014-03-10: 650 mg via ORAL
  Filled 2014-03-10: qty 2

## 2014-03-10 NOTE — Discharge Instructions (Signed)
I think that you are having a reaction to the fillers in the suboxone strips.  You should have your psychiatrist put you back on the tablets to make sure.

## 2014-03-10 NOTE — ED Nurses Note (Addendum)
Pt states she was sent from Dr. Starr SinclairWinfrey's office for elevated BP 176/133

## 2014-03-10 NOTE — ED Nurses Note (Signed)
Patient discharged home with family.  AVS reviewed with patient/care giver.  A written copy of the AVS and discharge instructions was given to the patient/care giver.  Questions sufficiently answered as needed.  Patient/care giver encouraged to follow up with PCP as indicated.  In the event of an emergency, patient/care giver instructed to call 911 or go to the nearest emergency room.

## 2014-03-10 NOTE — ED Provider Notes (Signed)
Idelle CrouchMuyderman, Alys Dulak M, MD  [facility]  Emergency Department Visit Note    Date:  03/10/2014  Primary care provider:  [facility]  Means of arrival:  private car  History obtained from: patient  History limited by: none    Chief Complaint:  High Blood Pressure    HISTORY OF PRESENT ILLNESS     Ellen Dillon, date of birth 02/25/1981, is a 33 y.o. female who presents to the Emergency Department complaining of high blood pressure.     Context:  Patient states that she has been experiencing high blood pressure today, adding that she was evaluated by Dr. Frances FurbishWinfrey (PCP) today. She affirms she had a blood pressure of 176/133 at approximately 5:15 PM while in Dr. Starr SinclairWinfrey's office. She reports that she has been getting "severe headaches" since she began taking Suboxone strips one week ago, adding that she was taking the tablets for three years without any symptoms. Patient affirms she smokes cigarettes.     REVIEW OF SYSTEMS     The pertinent positive and negative symptoms are as per HPI. All other systems reviewed and are negative.     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    Hypertension      due to pregnancy    Seizure      age 89     Anxiety     Panic attack        Past Surgical History:  Past Surgical History   Procedure Laterality Date    Hx breast biopsy       x3 cyst     Hx cyst incision and drainage  LEFT    Hx dilation and curettage  01/04/12      Dilation and Curettage suction        Family History:  Family History   Problem Relation Age of Onset    Diabetes Maternal Grandmother     Diabetes Maternal Grandfather     Healthy Father     Healthy Mother     Healthy Brother        Social History:  History   Substance Use Topics    Smoking status: Current Every Day Smoker -- 1.00 packs/day for 14 years     Types: Cigarettes    Smokeless tobacco: Not on file    Alcohol  Use: No     History   Drug Use No     Comment: hx of opiate abuse  oxycodone10mg  q 4        Medications:  Previous Medications    BUPRENORPHINE-NALOXONE (SUBOXONE) 8-2 MG SUBLINGUAL TABLET, SUBLINGUAL    1.5 Tabs by Sublingual route Once a day        Allergies:  Allergies   Allergen Reactions    Penicillins      HIVES    Sulfa (Sulfonamides)      HIVES       PHYSICAL EXAM     Vitals:  Filed Vitals:    03/10/14 1819   BP: 147/81   Pulse: 70   Temp: 37.1 C (98.8 F)   Resp: 18   SpO2: 100%       Pulse ox 100%  None (Room Air) interpreted by me as: Normal    Constitutional: Patient appears to be in no acute distress.    Head: Normocephalic and atraumatic.  ENT: Moist mucous membranes. No erythema or exudates in the oropharynx.  Eyes: EOM are normal. Pupils are equal, round, and reactive to light. No scleral icterus.   Neck: Neck supple. No meningismus.  Cardiovascular: Normal rate and regular rhythm. No murmur heard. 2+ distal pulses all 4 extremities.  Pulmonary/Chest: Effort normal and breath sounds normal.   Abdominal: Soft. No distension. There is no tenderness.   Back: There is no CVA tenderness.   Musculoskeletal: Normal range of motion. No edema and no tenderness. No clubbing or cyanosis.  Neurological: Patient is alert and oriented to person, place, and time. Strength and sensation normal in all extremities. Normal facial symmetry and speech.   Skin: Skin is warm and dry. No rash noted.     ED PROGRESS NOTE / MEDICAL DECISION MAKING     Old records reviewed by me:  I have reviewed the patient's problem list and the nurse's notes.       Orders Placed This Encounter    acetaminophen (TYLENOL) tablet     Patient was initially treated with Tylenol PO.      7:50 PM - Initial evaluation completed at this time.     7:53 PM - I indicated to the patient that she is ready for discharge home. Diagnosis, disposition, and plan were discussed with the patient at length -- she is in accordance with these. The patient was  advised of signs and symptoms for immediate return to the ED. She is agreeable to this treatment plan at this time.     Patient believes that her blood pressure is a reaction to the change from the pill to the strip.  She was not hypertensive and had no symptoms in the ED.    Pre-Disposition Vitals:  Filed Vitals:    03/10/14 1819 03/10/14 2005   BP: 147/81 119/90   Pulse: 70 95   Temp: 37.1 C (98.8 F)    Resp: 18 16   SpO2: 100% 98%       CLINICAL IMPRESSION     1. Drug reaction    DISPOSITION/PLAN     Discharged        Prescriptions:     No medications were prescribed during this visit.     Follow-Up:     [facility]  [address]  [place] [address]  [phone]    Call in 1 day        Condition at Disposition: Stable        SCRIBE ATTESTATION STATEMENT  I Harlin Heys, SCRIBE scribed for Idelle Crouch, MD on 03/10/2014 at 7:40 PM.     Documentation assistance provided for Idelle Crouch, MD  by Harlin Heys, SCRIBE. Information recorded by the scribe was done at my direction and has been reviewed and validated by me Renee Rival, Araceli Bouche, MD.

## 2014-04-29 ENCOUNTER — Encounter (HOSPITAL_BASED_OUTPATIENT_CLINIC_OR_DEPARTMENT_OTHER): Payer: Self-pay

## 2015-05-30 ENCOUNTER — Encounter (HOSPITAL_BASED_OUTPATIENT_CLINIC_OR_DEPARTMENT_OTHER): Payer: Self-pay

## 2015-05-30 ENCOUNTER — Emergency Department (HOSPITAL_BASED_OUTPATIENT_CLINIC_OR_DEPARTMENT_OTHER)
Admission: EM | Admit: 2015-05-30 | Discharge: 2015-05-30 | Disposition: A | Discharge status: Home / Self Care | Payer: Medicaid Other | Attending: Emergency Medicine | Admitting: Emergency Medicine

## 2015-05-30 DIAGNOSIS — F17210 Nicotine dependence, cigarettes, uncomplicated: Secondary | ICD-10-CM | POA: Insufficient documentation

## 2015-05-30 DIAGNOSIS — M549 Dorsalgia, unspecified: Secondary | ICD-10-CM | POA: Insufficient documentation

## 2015-05-30 MED ORDER — DIAZEPAM 5 MG TABLET
5.00 mg | ORAL_TABLET | Freq: Four times a day (QID) | ORAL | Status: DC | PRN
Start: 2015-05-30 — End: 2017-01-22

## 2015-05-30 MED ORDER — DICLOFENAC SODIUM 50 MG TABLET,DELAYED RELEASE
50.00 mg | DELAYED_RELEASE_TABLET | Freq: Two times a day (BID) | ORAL | Status: DC | PRN
Start: 2015-05-30 — End: 2017-01-22

## 2015-05-30 NOTE — ED Nurses Note (Signed)
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

## 2015-05-30 NOTE — Discharge Instructions (Signed)
Back Pain, Adult  Back pain is very common in adults. The cause of back pain is rarely dangerous and the pain often gets better over time. The cause of your back pain may not be known. Some common causes of back pain include:  · Strain of the muscles or ligaments supporting the spine.  · Wear and tear (degeneration) of the spinal disks.  · Arthritis.  · Direct injury to the back.  For many people, back pain may return. Since back pain is rarely dangerous, most people can learn to manage this condition on their own.  HOME CARE INSTRUCTIONS  Watch your back pain for any changes. The following actions may help to lessen any discomfort you are feeling:  · Remain active. It is stressful on your back to sit or stand in one place for long periods of time. Do not sit, drive, or stand in one place for more than 30 minutes at a time. Take short walks on even surfaces as soon as you are able. Try to increase the length of time you walk each day.  · Exercise regularly as directed by your health care provider. Exercise helps your back heal faster. It also helps avoid future injury by keeping your muscles strong and flexible.  · Do not stay in bed. Resting more than 1-2 days can delay your recovery.  · Pay attention to your body when you bend and lift. The most comfortable positions are those that put less stress on your recovering back. Always use proper lifting techniques, including:    Bending your knees.    Keeping the load close to your body.    Avoiding twisting.  · Find a comfortable position to sleep. Use a firm mattress and lie on your side with your knees slightly bent. If you lie on your back, put a pillow under your knees.  · Avoid feeling anxious or stressed. Stress increases muscle tension and can worsen back pain. It is important to recognize when you are anxious or stressed and learn ways to manage it, such as with exercise.  · Take medicines only as directed by your health care provider. Over-the-counter  medicines to reduce pain and inflammation are often the most helpful. Your health care provider may prescribe muscle relaxant drugs. These medicines help dull your pain so you can more quickly return to your normal activities and healthy exercise.  · Apply ice to the injured area:    Put ice in a plastic bag.    Place a towel between your skin and the bag.    Leave the ice on for 20 minutes, 2-3 times a day for the first 2-3 days. After that, ice and heat may be alternated to reduce pain and spasms.  · Maintain a healthy weight. Excess weight puts extra stress on your back and makes it difficult to maintain good posture.  SEEK MEDICAL CARE IF:  · You have pain that is not relieved with rest or medicine.  · You have increasing pain going down into the legs or buttocks.  · You have pain that does not improve in one week.  · You have night pain.  · You lose weight.  · You have a fever or chills.  SEEK IMMEDIATE MEDICAL CARE IF:   · You develop new bowel or bladder control problems.  · You have unusual weakness or numbness in your arms or legs.  · You develop nausea or vomiting.  · You develop abdominal pain.  · You feel faint.     This information   is not intended to replace advice given to you by your health care provider. Make sure you discuss any questions you have with your health care provider.     Document Released: 12/11/2005 Document Revised: 01/01/2015 Document Reviewed: 04/14/2014  [employer]® Patient Information ©2016 [employer].

## 2015-05-30 NOTE — ED Nurses Note (Signed)
Patient discharged home with family.  AVS reviewed with patient/care giver.  A written copy of the AVS and discharge instructions was given to the patient/care giver.  Questions sufficiently answered as needed.  Patient/care giver encouraged to follow up with PCP as indicated.  In the event of an emergency, patient/care giver instructed to call 911 or go to the nearest emergency room.        Current Discharge Medication List      START taking these medications.       Details    diazepam 5 mg Tablet   Commonly known as:  VALIUM    5 mg, Oral, EVERY 6 HOURS PRN   Qty:  12 Tab   Refills:  0       diclofenac sodium 50 mg Tablet, Delayed Release (E.C.)   Commonly known as:  VOLTAREN    50 mg, Oral, 2 TIMES DAILY PRN   Qty:  10 Tab   Refills:  0         CONTINUE these medications - NO CHANGES were made during your visit.       Details    buprenorphine-naloxone 8-2 mg Tablet, Sublingual   Commonly known as:  SUBOXONE    1.5 Tabs, Sublingual, DAILY,    Refills:  0

## 2015-05-30 NOTE — ED Provider Notes (Signed)
[name]  Emergency Department Visit Note    Date: 05/30/2015  Primary care provider: Nils Flack, MD  Means of arrival: private car  History obtained by: patient  History limited by: none      Chief Complaint: Back pain    History of Present Illness     Ellen Dillon, date of birth [birth year], is a 34 y.o. female who presents to the Emergency Department complaining of back pain.    Context:  /Patient states lower back pain since 2 days ago when she was lifting 2 heavy cases of water at work.  Ranks it as 8/10 and states tight.  Denies abdominal pain.  Denies CP or SOB.  States has had some mild back pain in the past.  States that her only current medication is suboxone that she has been on for 3 and 1/2 years since she got clean from opiates.   Denies loss of control of bowels or bladder.  Denies sensation changes in abdomen, genitals or pelvis.  Denies weakness in one leg over the other.  No pre-er treatment or meds.  No other c/o.  Pertinent Past Medical History:  See nursing  Onset:  2 days  Timing:  Constant  Location/Radiation:  Lower back  Quality:  Tight, aching  Severity:  8/10  Modifying Factors:  As above  Associated Symptoms:   Positive:  As above  Negative:  As above    Review of Systems     The pertinent positive and negative symptoms are as per HPI. All other systems reviewed and are negative.    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    Hypertension      due to pregnancy    Seizure      age 17     Anxiety     Panic attack        Past Surgical History:  Past Surgical History   Procedure Laterality Date    Hx breast biopsy      Hx cyst incision and drainage  LEFT    Hx dilation and curettage  01/04/12        Family History:  Family History   Problem Relation Age of Onset    Diabetes Maternal Grandmother     Diabetes Maternal Grandfather      Healthy Father     Healthy Mother     Healthy Brother            Social History:  History   Substance Use Topics    Smoking status: Current Every Day Smoker -- 1.00 packs/day for 14 years     Types: Cigarettes    Smokeless tobacco: Not on file    Alcohol Use: No     History   Drug Use No     Comment: hx of opiate abuse  oxycodone10mg  q 4        Medications:  Discharge Medication List as of 05/30/2015 12:07 PM      CONTINUE these medications which have NOT CHANGED    Details   buprenorphine-naloxone (SUBOXONE) 8-2 mg Sublingual Tablet, Sublingual 1.5 Tabs by Sublingual route Once a day , Historical Med             Allergies:   Allergies   Allergen Reactions    Penicillins  HIVES    Sulfa (Sulfonamides)      HIVES       Physical Exam     Vital Signs:  Filed Vitals:    05/30/15 1150   BP: 129/66   Pulse: 63   Temp: 36.7 C (98 F)   Resp: 14   SpO2: 99%       The initial visit vital signs are reviewed as above.     Pulse Ox: 99% on None (Room Air); interpreted by me GN:FAOZHYas:Normal    GENERAL:  This is a well appearing 33yof patient who is interactive, appropriate and showing no outward signs of distress.  Non-toxic. Ambulatory into room.  HEAD:  Atraumatic, normocephalic.  HEENT:  Conjunctival WNL, no gross deformity noted.  Mucous membranes moist.  NECK:  Supple, no meningeal signs.  HEART: RRR  LUNGS: CTA  ABDOMEN:  Soft.  Non-tender.  Normal sensation throughout.  BACK:  Pain to palpation diffusely throughout lumbar spine musculature without point specific tenderness, step off or deformity present.  No vertebral tenderness.  Normal SLR.  Patellar DTR's WNL.  Heel and toe rises easily.  Ambulatory without difficulty.  EXTREMITIES: Moving all well.  SKIN:  Warm, good color.  No rashes noted.  NEURO/PSYCH:  Patient is interactive, appropriate and grossly intact.    ED Progress Note/Medical Decision Making       Orders Placed This Encounter    diazepam (VALIUM) 5 mg Oral Tablet    diclofenac sodium (VOLTAREN)  50 mg Oral Tablet, Delayed Release (E.C.)       Patient and I discussed her back injury which appears to be muscular in nature.  I do not feel that x-ray/CT is indicated based on her exam/history and instead advised her that we will treat her symptomatically and if she continues with pain she should f/u with PMD for the possibility of MRI.  Patient is in agreement with this plan.  She is given valium and voltaren.  To use ice/heat.  To have f/u with PMD and strict return precautions were discussed.  Related understanding to treatment and plan and was d/c to home in stable condition.   Supervising physician: Dr. Ellery PlunkBest      Pre-Disposition Vitals:  Filed Vitals:    05/30/15 1150   BP: 129/66   Pulse: 63   Temp: 36.7 C (98 F)   Resp: 14   SpO2: 99%       Clinical Impression      1. Acute back pain    Plan/Disposition     Discharged    Prescriptions:  Discharge Medication List as of 05/30/2015 12:07 PM      START taking these medications    Details   diazepam (VALIUM) 5 mg Oral Tablet Take 1 Tab (5 mg total) by mouth Every 6 hours as needed for Anxiety, Disp-12 Tab, R-0, Print      diclofenac sodium (VOLTAREN) 50 mg Oral Tablet, Delayed Release (E.C.) Take 1 Tab (50 mg total) by mouth Twice per day as needed, Disp-10 Tab, R-0, Print             Follow Up:  Nils FlackWinfrey, Charles J, MD  [address]  [place] [id number]  [phone]    Schedule an appointment as soon as possible for a visit            Condition on Disposition: Stable

## 2017-01-22 ENCOUNTER — Emergency Department (HOSPITAL_BASED_OUTPATIENT_CLINIC_OR_DEPARTMENT_OTHER)
Admission: EM | Admit: 2017-01-22 | Discharge: 2017-01-22 | Disposition: A | Discharge status: Home / Self Care | Payer: Medicaid Other | Attending: Emergency Medicine | Admitting: Emergency Medicine

## 2017-01-22 ENCOUNTER — Encounter (HOSPITAL_BASED_OUTPATIENT_CLINIC_OR_DEPARTMENT_OTHER): Payer: Self-pay

## 2017-01-22 DIAGNOSIS — F17210 Nicotine dependence, cigarettes, uncomplicated: Secondary | ICD-10-CM | POA: Insufficient documentation

## 2017-01-22 DIAGNOSIS — J029 Acute pharyngitis, unspecified: Secondary | ICD-10-CM | POA: Insufficient documentation

## 2017-01-22 LAB — STREP A MOLECULAR RAPID - BMC/JMC ONLY: STREP A MOLECULAR RAPID: NEGATIVE

## 2017-01-22 LAB — INFLUENZA VIRUS TYPE A AND TYPE B, PCR
INFLUENZA VIRUS TYPE A: NEGATIVE
INFLUENZA VIRUS TYPE B: NEGATIVE

## 2017-01-22 MED ORDER — ACETAMINOPHEN 325 MG TABLET
650.00 mg | ORAL_TABLET | ORAL | Status: AC
Start: 2017-01-22 — End: 2017-01-22
  Administered 2017-01-22: 650 mg via ORAL
  Filled 2017-01-22: qty 2

## 2017-01-22 MED ORDER — AZITHROMYCIN 250 MG TABLET: Tab | ORAL | 0 refills | 0 days | Status: DC

## 2017-01-22 NOTE — ED Provider Notes (Signed)
Dr. Si GaulMichael Z. Hezzie BumpPhillips        [facility]          Emergency Department Visit Note    Date of Service: 01/22/2017  Primary Care Doctor:David Didden, MD  Patient information was obtained from patient.  History/Exam limitations: none.  Patient presented to the Emergency Department by private vehicle.    Chief Complaint: sore throat    HPI: The patient is a(an) 36 y.o. female.       Ten day history of sore throat, nasal congestion, headache.  Symptoms rated as an 8/10.  Nothing makes better or worse.  Symptoms are constant    Review of Systems:    The pertinent positive and negative symptoms are as per HPI. All other systems reviewed and are negative.      Past Medical History:  Past Medical History:   Diagnosis Date   . Anxiety    . Hypertension     due to pregnancy   . 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 Overweight(278.02)    . Panic attack    . Seizure [facility](HCC)     age 89            Past Surgical History:  Past Surgical History:   Procedure Laterality Date   . HX BREAST BIOPSY      x3 cyst    . HX CYST INCISION AND DRAINAGE  LEFT   . HX DILATION AND CURETTAGE  01/04/12     Dilation and Curettage suction            Family History:  Family Medical History     Problem Relation (Age of Onset)    Diabetes Maternal Grandmother, Maternal Grandfather    Healthy Father, Mother, Brother              No acute family history provided  Social History:  Social History   Substance Use Topics   . Smoking status: Current Every Day Smoker     Packs/day: 1.00     Years: 14.00     Types: Cigarettes   . Smokeless tobacco: Never Used   . Alcohol use No     History   Drug Use No     Comment: hx of opiate abuse  oxycodone10mg  q 4        Current Outpatient Medications:   Outpatient Prescriptions Marked as Taking for the 01/22/17 encounter [facility](Hospital Encounter)   Medication Sig   . azithromycin (ZITHROMAX) 250 mg Oral Tablet Take 500 mg (2 tab) on day 1; take 250 mg (1 tab) on days 2-5.   . buprenorphine-naloxone  (SUBOXONE) 8-2 mg Sublingual Tablet, Sublingual 1.5 Tabs by Sublingual route Once a day        Allergies:   Allergies   Allergen Reactions   . Penicillins      HIVES   . Sulfa (Sulfonamides)      HIVES       Physical Exam     Vital Signs:  Filed Vitals:    01/22/17 1800   BP: (!) 148/69   Pulse: 83   Resp: 18   Temp: 37.8 C (100.1 F)   SpO2: 98%       The initial visit vital signs are reviewed as above.    Pulse oximetry is 98% on ra. normal    Physical Exam:   General: No apparent acute distress. Very pleasant.   Eyes: Conjunctiva are clear. Pupils are equal, round  HENT: Mucous  membranes are moist. Nares are clear. Posterior oropharynx is  Erythematous, TMs normal.  Neck: Supple. No meningeal signs.  Lungs: Clear to auscultation bilaterally. Good air movement.   Cardiovascular: Normal rate and regular rhythm. No murmurs, rubs or gallops.  Abdomen: Soft. Non-tender. No rebound, guarding, or peritoneal signs.   Extremities: Atraumatic. No cyanosis. No significant peripheral edema.  Skin: Warm and dry without rashes      Diagnostics     Labs:  Results for orders placed or performed during the hospital encounter of 01/22/17 (from the past 24 hour(s))   STREP A MOLECULAR RAPID - [facility] ONLY   Result Value Ref Range    STREP A MOLECULAR RAPID Negative Negative    Narrative    Internal validation of rapid molecular group A Streptococcus test compared to culture showed a negative predictive value of 99.7%. However, if a negative result is obtained and infection with group A streptococcus is clinically suspected, please add on a throat culture.   INFLUENZA VIRUS TYPE A AND TYPE B, PCR   Result Value Ref Range    INFLUENZA VIRUS TYPE A Negative Negative    INFLUENZA VIRUS TYPE B Negative Negative     Labs reviewed by me.      ED Course   Summary/ Plan:   Orders Placed This Encounter   . STREP A MOLECULAR RAPID - [facility] ONLY   . INFLUENZA VIRUS TYPE A AND TYPE B, PCR   . acetaminophen (TYLENOL) tablet   . azithromycin  (ZITHROMAX) 250 mg Oral Tablet       ED impression  1.  Upper respiratory infection  Disposition discharged home stable            1. I have screened the patient for tobacco use and the patient is a tobacco user.  I have counseled the patient for less than 3 minutes to quit using tobacco products due to their multiple adverse health effects.      STRICT return precautions were discussed and are understood. The patient is currently stable for discharge. All questions and concerns were answered to  satisfaction and  the plan was agreed upon. The discharge, follow-up, and return instructions were understood      Filed Vitals:    01/22/17 1800   BP: (!) 148/69   Pulse: 83   Resp: 18   Temp: 37.8 C (100.1 F)   SpO2: 98%         Prescriptions:  Discharge Medication List as of 01/22/2017  6:58 PM      START taking these medications    Details   azithromycin (ZITHROMAX) 250 mg Oral Tablet Take 500 mg (2 tab) on day 1; take 250 mg (1 tab) on days 2-5., Disp-6 Tab, R-0, Print             Follow up:  Shelva Majestic, MD  [address]  [place] [address]  [phone]    Go to        Fatima Blank, MD 01/22/2017, 20:06         Portions of this note may be dictated using voice recognition software or a dictation service. Variances in spelling and vocabulary are possible and unintentional. Not all errors are caught/corrected. Please notify the Thereasa Parkin if any discrepancies are noted or if the meaning of any statement is not clear.

## 2017-01-22 NOTE — ED Nurses Note (Signed)
Patient discharged home with family.  AVS reviewed with patient/care giver.  A written copy of the AVS and discharge instructions was given to the patient/care giver.  Questions sufficiently answered as needed.  Patient/care giver encouraged to follow up with PCP as indicated.  In the event of an emergency, patient/care giver instructed to call 911 or go to the nearest emergency room.   New Prescriptions    AZITHROMYCIN (ZITHROMAX) 250 MG ORAL TABLET    Take 500 mg (2 tab) on day 1; take 250 mg (1 tab) on days 2-5.

## 2017-01-22 NOTE — Discharge Instructions (Signed)
Upper Respiratory Illness  You have a upper respiratory illness (URI), It is spread through the air by coughing and sneezing. It may also be spread by direct contact (touching the sick person and then touching your own eyes, nose, or mouth). Frequent handwashing will decrease risk of spread.  Home care  If symptoms are severe, rest at home for the first 2 to 3 days. When you resume activity, don't let yourself get too tired.  Avoid being exposed to cigarette smoke (yours or others').  You may use acetaminophen or ibuprofen to control pain and fever, unless another medicine was prescribed. (Note: If you have chronic liver or kidney disease, have ever had a stomach ulcer or gastrointestinal bleeding, or are taking blood-thinning medicines, talk with your healthcare provider before using these medicines.) Aspirin should never be given to anyone under 18 years of age who is ill with a viral infection or fever. It may cause severe liver or brain damage.  Your appetite may be poor, so a light diet is fine. Avoid dehydration by drinking 6 to 8 glasses of fluids per day (water, soft drinks, juices, tea, or soup). Extra fluids will help loosen secretions in the nose and lungs.  Over-the-counter cold medicines will not shorten the length of time you're sick, but they may be helpful for the following symptoms: cough, sore throat, and nasal and sinus congestion. (Note: Do not use decongestants if you have high blood pressure.)  Follow-up care  Follow up with your healthcare provider, or as advised.  When to seek medical advice  Call your healthcare provider right away if any of these occur:  Cough with lots of colored sputum (mucus)  Severe headache; face, neck, or ear pain  Difficulty swallowing due to throat pain  Fever of 100.4F (38C)  Call 911, or get immediate medical care  Call emergency services right away if any of these occur:  Chest pain, shortness of breath, wheezing, or difficulty breathing  Coughing up  blood  Inability to swallow due to throat pain  Date Last Reviewed: 09/06/2014   2000-2017 [employer] [address], [place] [address]. All rights reserved. This information is not intended as a substitute for professional medical care. Always follow your healthcare professional's instructions.

## 2017-01-22 NOTE — ED Triage Notes (Signed)
Sore throat, congestion and headache. 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

## 2017-08-02 ENCOUNTER — Encounter: Admit: 2017-08-02 | Discharge: 2017-08-02 | Payer: PRIVATE HEALTH INSURANCE

## 2017-08-07 ENCOUNTER — Encounter (HOSPITAL_BASED_OUTPATIENT_CLINIC_OR_DEPARTMENT_OTHER): Payer: Self-pay

## 2017-08-07 ENCOUNTER — Emergency Department (HOSPITAL_BASED_OUTPATIENT_CLINIC_OR_DEPARTMENT_OTHER): Payer: MEDICAID

## 2017-08-07 ENCOUNTER — Emergency Department (HOSPITAL_BASED_OUTPATIENT_CLINIC_OR_DEPARTMENT_OTHER)
Admission: EM | Admit: 2017-08-07 | Discharge: 2017-08-07 | Disposition: A | Discharge status: Home / Self Care | Payer: MEDICAID | Attending: Emergency Medicine | Admitting: Emergency Medicine

## 2017-08-07 DIAGNOSIS — F17210 Nicotine dependence, cigarettes, uncomplicated: Secondary | ICD-10-CM | POA: Insufficient documentation

## 2017-08-07 DIAGNOSIS — M2012 Hallux valgus (acquired), left foot: Secondary | ICD-10-CM | POA: Insufficient documentation

## 2017-08-07 DIAGNOSIS — M2042 Other hammer toe(s) (acquired), left foot: Secondary | ICD-10-CM | POA: Insufficient documentation

## 2017-08-07 DIAGNOSIS — F1110 Opioid abuse, uncomplicated: Secondary | ICD-10-CM | POA: Insufficient documentation

## 2017-08-07 DIAGNOSIS — M79672 Pain in left foot: Secondary | ICD-10-CM | POA: Insufficient documentation

## 2017-08-07 DIAGNOSIS — Z79899 Other long term (current) drug therapy: Secondary | ICD-10-CM | POA: Insufficient documentation

## 2017-08-07 MED ORDER — DICLOFENAC SODIUM 75 MG TABLET,DELAYED RELEASE: 75 mg | Tab | Freq: Two times a day (BID) | ORAL | 0 refills | 0 days | Status: DC

## 2017-08-07 MED ORDER — PREDNISONE 50 MG TABLET
50.0000 mg | ORAL_TABLET | Freq: Every day | ORAL | 0 refills | Status: DC
Start: 2017-08-07 — End: 2019-01-30

## 2017-08-07 NOTE — ED Nurses Note (Signed)
Ace wrap and hard sole shoe applied

## 2017-08-07 NOTE — ED Provider Notes (Signed)
Dr. Si GaulMichael Z. Hezzie BumpPhillips        [facility], [place]  [facility]          Emergency Department Visit Note    Date of Service: 08/07/2017  Primary Care Doctor:David Didden, MD  Patient information was obtained from patient.  History/Exam limitations: none.  Patient presented to the Emergency Department by private vehicle.    Chief Complaint:   Foot pain    HPI: The patient is a(an) 36 y.o. female.     Several day history of lateral left foot pain.  Worse with walking.  Rated at 0/10.  No injury to the area.      Review of Systems:    The pertinent positive and negative symptoms are as per HPI. All other systems reviewed and are negative.      Past Medical History:  Past Medical History:   Diagnosis Date   . Anxiety    . Hypertension     due to pregnancy   . 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 Overweight(278.02)    . Panic attack    . Seizure ([facility])     age 89            Past Surgical History:  Past Surgical History:   Procedure Laterality Date   . HX BREAST BIOPSY      x3 cyst    . HX CYST INCISION AND DRAINAGE  LEFT   . HX DILATION AND CURETTAGE  01/04/12     Dilation and Curettage suction            Family History:  Family Medical History     Problem Relation (Age of Onset)    Diabetes Maternal Grandmother, Maternal Grandfather    Healthy Father, Mother, Brother              No acute family history provided  Social History:  Social History   Substance Use Topics   . Smoking status: Current Every Day Smoker     Packs/day: 1.00     Years: 14.00     Types: Cigarettes   . Smokeless tobacco: Never Used   . Alcohol use No     History   Drug Use No     Comment: denies       Current Outpatient Medications:   Outpatient Prescriptions Marked as Taking for the 08/07/17 encounter [facility](Hospital Encounter)   Medication Sig   . buprenorphine-naloxone (SUBOXONE) 8-2 mg Sublingual Tablet, Sublingual 1.5 Tabs by Sublingual route Once a day    . diclofenac sodium (VOLTAREN) 75 mg Oral Tablet, Delayed Release (E.C.) Take 1 Tab  (75 mg total) by mouth Twice daily   . predniSONE (DELTASONE) 50 mg Oral Tablet Take 1 Tab (50 mg total) by mouth Once a day       Allergies:   Allergies   Allergen Reactions   . Penicillins      HIVES   . Sulfa (Sulfonamides)      HIVES       Physical Exam     Vital Signs:  Filed Vitals:    08/07/17 1632   BP: (!) 153/83   Pulse: 71   Resp: 18   Temp: 36.9 C (98.5 F)   SpO2: 100%       The initial visit vital signs are reviewed as above.    Pulse oximetry is 100% on ra. normal    Physical Exam:   General: No apparent acute distress. Very pleasant.   Eyes:  Conjunctiva are clear. Pupils are equal, round  HENT: Mucous membranes are moist. Nares are clear.   Neck: Supple. No meningeal signs.  Extremities: Atraumatic. No cyanosis. No significant peripheral edema.  Skin: Warm and dry without rashes  Neurologic: Strength and sensation grossly normal throughout.  Psychiatric: Alert and oriented. Affect within normal limits.      Diagnostics         Radiology:  XR FOOT LEFT SERIES,   Results for orders placed or performed during the hospital encounter of 08/07/17 (from the past 72 hour(s))   XR FOOT LEFT SERIES     Status: None    Narrative    RADIOLOGIST: Dimitri Misailidis, MD    EXAMINATION: XR FOOT LEFT SERIES     EXAM DATE/TIME: 08/07/2017 4:41 PM    CLINICAL INDICATION: pain to lateral foot    Number of Views: Three    COMPARISON: 03/18/2012.    FINDINGS:    BONES: Normal    JOINT SPACE: Hallux valgus of the first metatarsal with interval worsening  in the metatarsal adduction compared the prior study. In the lateral  projection there is a hammertoe deformity of the second toe.    SOFT TISSUE: Normal      Impression    1. No evidence of fracture.  2. Hallux valgus shows interval radiographic worsening. There is a  hammertoe deformity of the second toe.       , interpreted by radiologist and independently reviewed by me.      ED Course   Summary/ Plan:   Orders Placed This Encounter   . XR FOOT LEFT SERIES   .  diclofenac sodium (VOLTAREN) 75 mg Oral Tablet, Delayed Release (E.C.)   . predniSONE (DELTASONE) 50 mg Oral Tablet       ED impression  1.  Left foot pain placed in Ace wrap and hard-soled shoe by ED tech check by me remains neurovascularly intact distally  Disposition discharged home stable        STRICT return precautions were discussed and are understood. The patient is currently stable for discharge. All questions and concerns were answered to  satisfaction and  the plan was agreed upon. The discharge, follow-up, and return instructions were understood      Filed Vitals:    08/07/17 1632   BP: (!) 153/83   Pulse: 71   Resp: 18   Temp: 36.9 C (98.5 F)   SpO2: 100%         Prescriptions:  New Prescriptions    DICLOFENAC SODIUM (VOLTAREN) 75 MG ORAL TABLET, DELAYED RELEASE (E.C.)    Take 1 Tab (75 mg total) by mouth Twice daily    PREDNISONE (DELTASONE) 50 MG ORAL TABLET    Take 1 Tab (50 mg total) by mouth Once a day       Follow up:  Thera Flake, DPM  [address]  [place] [address]  [phone]            Fatima Blank, MD 08/07/2017, 17:17         Portions of this note may be dictated using voice recognition software or a dictation service. Variances in spelling and vocabulary are possible and unintentional. Not all errors are caught/corrected. Please notify the Thereasa Parkin if any discrepancies are noted or if the meaning of any statement is not clear.

## 2017-08-07 NOTE — ED Nurses Note (Signed)
Pt refused w/c. To xray from triage.

## 2017-08-07 NOTE — Discharge Instructions (Signed)
Foot Sprain    A sprain is a stretching or tearing of the ligaments that hold a joint together. There are no broken bones. Sprainsgenerallytake from 3-6 weeks to heal. A sprain may be treated with a splint, walking cast, or special boot. Mild sprains may not need any additional support.  Home care  The following guidelines will help you care for your injury at home:   Keep your leg elevated when sitting or lying down. This is very important during the first 48 hours to reduce swelling. Stay off the injured foot as much as possible until you can walk on it without pain. If needed, you may use crutches during the first week for this purpose. Crutches can be rented at many pharmacies or surgical/orthopedic supply stores.   You may be given a cast shoe to wear to prevent movement in your foot. If not, you can use a sandal or any shoe that does not put pressure on the injured area until the swelling and pain go away. If using a sandal, be careful not to hit your foot against anything, since another injury could make the sprain worse.   Apply an ice pack over the injured area for 15 to 20 minutes every3 to 6hours. You should do this forthe first24 to 48 hours.You can make an ice pack by filling a plastic bag that seals at the top with ice cubes and then wrapping it with a thin towel. Continue to use ice packs for relief of pain and swelling as needed. As the ice melts, avoid getting any wrap, splint, or cast wet. After 48 hours, apply heatfrom a warm shower or bath for 20 minutes several times daily.Alternating ice and heat may also be helpful.   You may useover-the-counter pain medicineto control pain, unless another medicine was prescribed. If you have chronic liver or kidney disease or ever had a stomach ulcer or GI bleeding, talk with your healthcare provider beforeusing these medicines.   If you were given a splint or cast, keep it dry. Bathe with your splint or cast well out of the water,  protected with 2large plastic bags, rubber-banded at the top end. If a fiberglass splint or cast gets wet, you can dry it with a hair dryer.   You may return to sports after healing, when you can run without pain.  Follow-up care  Follow up with your healthcare provider as directed. Sometimes fractures don't show up on the first X-ray. Bruises and sprains can sometimes hurt as much as a fracture. These injuries can take time to heal completely. If your symptoms don't improve or they get worse, talk with your healthcare provider. You may need a repeat X-ray.  When to seek medical advice  Call your healthcare provider right awayif any of these occur:   The plaster cast or splint gets wet or soft   The fiberglass cast or splint gets wet and does not dry for 24 hours   Pain or swelling increases, or redness appears   A badodor comes from within the cast   Fever of 100.4F (38C) or above lasting for 24 to 48 hours   Toeson the injured footbecome cold, blue, numb, or tingly  Date Last Reviewed: 11/13/2014   2000-2017 [employer] [address], [place] [address]. All rights reserved. This information is not intended as a substitute for professional medical care. Always follow your healthcare professional's instructions.

## 2017-08-07 NOTE — ED Triage Notes (Signed)
Pt c/o pain to lateral aspect of left foot x several days. No known injury. Pt ambulates without difficulty. No swelling or redness to area.

## 2017-08-07 NOTE — ED Nurses Note (Signed)
Patient discharged home with family.  AVS reviewed with patient/care giver.  A written copy of the AVS and discharge instructions was given to the patient/care giver.  Questions sufficiently answered as needed.  Patient/care giver encouraged to follow up with PCP as indicated.  In the event of an emergency, patient/care giver instructed to call 911 or go to the nearest emergency room.      Current Discharge Medication List      START taking these medications.       Details    diclofenac sodium 75 mg Tablet, Delayed Release (E.C.)   Commonly known as:  VOLTAREN    75 mg, Oral, 2x/day   Qty:  14 Tab   Refills:  0       predniSONE 50 mg Tablet   Commonly known as:  DELTASONE    50 mg, Oral, Daily   Qty:  5 Tab   Refills:  0         CONTINUE these medications - NO CHANGES were made during your visit.       Details    buprenorphine-naloxone 8-2 mg Tablet, Sublingual   Commonly known as:  SUBOXONE    8 mg, Daily   Refills:  0

## 2017-08-14 ENCOUNTER — Encounter: Admit: 2017-08-14 | Discharge: 2017-08-14 | Payer: PRIVATE HEALTH INSURANCE

## 2017-08-14 ENCOUNTER — Ambulatory Visit: Admit: 2017-08-14 | Discharge: 2017-08-15

## 2017-08-14 ENCOUNTER — Ambulatory Visit: Admit: 2017-08-14 | Discharge: 2017-08-14 | Payer: PRIVATE HEALTH INSURANCE

## 2017-08-14 ENCOUNTER — Ambulatory Visit: Admit: 2017-08-14 | Discharge: 2017-08-14 | Payer: BC Managed Care – PPO

## 2017-08-14 DIAGNOSIS — E559 Vitamin D deficiency, unspecified: Principal | ICD-10-CM

## 2017-08-14 DIAGNOSIS — Z872 Personal history of diseases of the skin and subcutaneous tissue: ICD-10-CM

## 2017-08-14 DIAGNOSIS — J45909 Unspecified asthma, uncomplicated: ICD-10-CM

## 2017-08-14 DIAGNOSIS — R5382 Chronic fatigue, unspecified: ICD-10-CM

## 2017-08-14 DIAGNOSIS — M2550 Pain in unspecified joint: ICD-10-CM

## 2017-08-14 DIAGNOSIS — G8929 Other chronic pain: ICD-10-CM

## 2017-08-14 DIAGNOSIS — M545 Low back pain: ICD-10-CM

## 2017-08-14 DIAGNOSIS — I10 Essential (primary) hypertension: ICD-10-CM

## 2017-08-14 DIAGNOSIS — E119 Type 2 diabetes mellitus without complications: ICD-10-CM

## 2017-08-14 DIAGNOSIS — F419 Anxiety disorder, unspecified: ICD-10-CM

## 2017-08-14 DIAGNOSIS — R600 Localized edema: ICD-10-CM

## 2017-08-14 DIAGNOSIS — R7982 Elevated C-reactive protein (CRP): ICD-10-CM

## 2017-08-14 DIAGNOSIS — R7989 Other specified abnormal findings of blood chemistry: ICD-10-CM

## 2017-08-14 DIAGNOSIS — E039 Hypothyroidism, unspecified: ICD-10-CM

## 2017-08-14 DIAGNOSIS — M549 Dorsalgia, unspecified: ICD-10-CM

## 2017-08-14 DIAGNOSIS — F329 Major depressive disorder, single episode, unspecified: ICD-10-CM

## 2017-08-14 DIAGNOSIS — G43909 Migraine, unspecified, not intractable, without status migrainosus: Principal | ICD-10-CM

## 2017-08-14 LAB — URINALYSIS MICROSCOPIC REFLEX TO CULTURE

## 2017-08-14 LAB — HEPATITIS B CORE AB TOT (IGG+IGM): Lab: NEGATIVE

## 2017-08-14 LAB — COMPREHENSIVE METABOLIC PANEL
Lab: 137 MMOL/L (ref 137–147)
Lab: 25 MMOL/L (ref 21–30)
Lab: 4 MMOL/L (ref 3.5–5.1)
Lab: 7 10*3/uL (ref 3–12)
Lab: >60 mL/min (ref >60)

## 2017-08-14 LAB — HEPATITIS C ANTIBODY W REFLEX HCV PCR QUANT: Lab: NEGATIVE

## 2017-08-14 LAB — PROTEIN/CR RATIO,UR RAN
Lab: 6 mg/dL (ref 0–5)
Lab: 80 mg/dL

## 2017-08-14 LAB — C REACTIVE PROTEIN (CRP): Lab: 1.4 mg/dL — ABNORMAL HIGH (ref <1.0)

## 2017-08-14 LAB — HEPATITIS B SURFACE AG: Lab: NEGATIVE mL/min (ref 0–0.45)

## 2017-08-14 LAB — SED RATE: Lab: 35 mm/h — ABNORMAL HIGH (ref 0–20)

## 2017-08-14 LAB — VITAMIN B12: Lab: 381 pg/mL (ref 180–914)

## 2017-08-14 LAB — 25-OH VITAMIN D (D2 + D3): Lab: 26 ng/mL — ABNORMAL LOW (ref 30–80)

## 2017-08-14 LAB — URINALYSIS DIPSTICK REFLEX TO CULTURE

## 2017-08-14 LAB — CBC AND DIFF
Lab: 12 g/dL (ref 12.0–15.0)
Lab: 4.4 M/UL (ref 4.0–5.0)
Lab: 5.9 K/UL (ref 4.5–11.0)

## 2017-08-14 LAB — RHEUMATOID FACTOR (RF): Lab: <20 [IU]/mL (ref <24)

## 2017-08-14 MED ORDER — DICLOFENAC SODIUM 1 % TP GEL
2 g | Freq: Three times a day (TID) | TOPICAL | 0 refills | 25.00000 days | Status: AC | PRN
Start: 2017-08-14 — End: ?

## 2017-08-14 NOTE — Progress Notes
Date of Service: 08/14/2017  Referring provider: Sheila Oats, PA-C  Reason for referral: severe fatigue, chronic pain and elevated sed rate   She works as a Engineer, civil (consulting).     Subjective:             Dorothy Todd is a 36 y.o. female.    History of Present Illness     Dorothy Todd is a pleasant 36 year old female with history of asthma, hypertension, back pain, diabetes, hypothyroidism, morbid obesity, anxiety/depression who was referred for evaluation of severe fatigue, chronic pain and elevated sed rate.    Patient reports that she has been having pain for more than 10 years.  She complains of pain in her lower back, knees with the left being worse than the right, and right thumb.  Her knee pain is worse when she goes up the stairs and she sometimes has to stop. She feels like her knees are going to give out from time to time.  She reports intermittent swelling in her left knee.  She denies swelling in other joints.  She reports having multiple arthrocentesis done at the age of 18-16 for an infection in her left knee.  She denies recent arthrocentesis.  Her right thumb pain has been present for about 2 weeks.  She thought that she might have jammed her right thumb.  She denies pain in her other fingers.  She denies morning stiffness.  She notes occasional edema/fluid retention in her legs for which she takes Lasix as needed.    In regards to her low back pain, she states that the pain goes across her lower back with occasional radiation down to her left hip (lateral aspect of her left thigh).  It does not go past her knee.  The pain is worse with activity and prolonged standing.  She denies buttock pain.  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  She has never seen a dermatologist.  She states that she was initially diagnosed with psoriasis by a physician in [place] where she was living and the dx was confirmed by her current PCP.  Her grandmother and mother also have psoriasis.  She denies history of IBD.  Denies history of eye inflammation.    She endorses constant numbness in her fingers and toes.  She reports that she tried gabapentin for her neuropathy but it was not helpful.  She took Lyrica which provided good relief but then it became ineffective.  She also complains of muscle pain.  She has never had a nerve conduction study.    She reports that she has been taking prednisone intermittently for her joint symptoms and allergies.  She just got off a seven-day course of prednisone 20 mg daily last week.  She states that it helped bring the pain level down from 10 to 5.  She reports that steroids do not always help.    She reports feeling fatigued all the time and this has been going on for many years.  She does not feel refreshed when waking up in the morning.  She complains of migraine headaches for which she takes Fioricet as needed.  Her husband states that she snores occasionally but has never stopped breathing.  She endorses daytime sleepiness.  She notes difficulty falling asleep and staying asleep.  She has been taking Ambien for more than 10 years.  She reports that she had a sleep study when she first started taking Ambien.      Current medications:  Atorvastatin 20 mg daily  Xanax 0.25 at bedtime as needed  Zyrtec 10 mg daily  Flexeril 10 mg as needed  Hydrocodone/acetaminophen 1 tabs as needed  Levothyroxine 25 mcg daily  Lisinopril 10 mg daily  Protonix 40 mg daily  Victoza  Tizanidine 4 mg as needed  Ambien 10 mg at bedtime  Potassium 20 mEq. as needed  Furosemide as needed  Prozac 40 mg daily         Review of Systems    RHEUMATOLOGIC ROS:  GEN: No fevers/chills. No recent weight loss.  Weight fluctuates +/- 10-20 lb according to her fluid status. No change in appetite.+occasional sweats. No night sweats.     HEENT: + dry mouth. +oral sores.  No hearing changes.  No ear pain.    EYES: No vision changes.  No eye pain. + dry eyes.   PULM: No shortness of breath.  No wheezing.  No cough. CV: +mid sternal chest pain, intermittent, not related to physical activity.  No palpitations.  +leg edema.    GU: No hematuria.  No dysuria.    GI: No abdominal pain.  No vomiting.  + nausea w/ migraine headaches.  No diarrhea.  No constipation.  No blood in stool or melana.  +heartburn. +difficulty swallowing. She reports that she had a procedure to stretch her esophagus as a child.   HEME: + easy bruising. No abnormal bleeding. ?hx of blood clot in her R leg in 09/2016. She states that she took coumadin for only 1 month. No hx of miscarriage. She has 4 children.   MSK: + back pain.   SKIN: No rash. No lesions. +diffuse hair loss but no bald spot. No nail changes.  No color changes in fingers or toes. No photosensitivity.   NEURO: No focal weakness.  + numbness in hands and feet.  +headaches.  +dizziness.  PSYCH: +sleep problems.   All other 14 systems reviewed and are negative.       Constitutional: Positive for unexpected weight change.   HENT: Positive for congestion.    Eyes: Dry eyeness   Cardiovascular: Positive for chest pain, palpitations and leg swelling.   Gastrointestinal: Positive for nausea.   Musculoskeletal: Positive for arthralgias, back pain, gait problem, joint swelling, myalgias, neck pain and neck stiffness.   Skin: Positive for rash.   Allergic/Immunologic: Positive for environmental allergies.   Neurological: Positive for dizziness, light-headedness, numbness and headaches.   Hematological: Positive for adenopathy.   Psychiatric/Behavioral: Positive for agitation and sleep disturbance.   All other systems reviewed and are negative.    Past Medical History:   Diagnosis Date   ??? Anxiety    ??? Asthma    ??? Back pain    ??? Depression    ??? Diabetes (HCC)    ??? HTN (hypertension)    ??? Hypothyroidism    ??? Migraines      Past Surgical History:   Procedure Laterality Date   ??? HX CESAREAN SECTION     ??? HX TUBAL LIGATION         MEDS: ??? ALPRAZolam (XANAX) 0.25 mg tablet Take 0.25 mg by mouth at bedtime as needed for Anxiety.   ??? cephalexin (KEFLEX) 500 mg capsule Take 500 mg by mouth four times daily.   ??? cetirizine (ZYRTEC) 10 mg tablet Take 10 mg by mouth every morning.   ??? cyclobenzaprine (FLEXERIL) 10 mg tablet Take 10 mg by mouth three times daily as needed for Muscle Cramps.   ??? gabapentin (NEURONTIN) 300 mg capsule  Take 300 mg by mouth every 8 hours.   ??? HYDROcodone/acetaminophen (NORCO) 5/325 mg tablet Take 1 tablet by mouth every 4 hours as needed for Pain   ??? levothyroxine (SYNTHROID) 25 mcg tablet Take 25 mcg by mouth daily 30 minutes before breakfast.   ??? lisinopril (PRINIVIL; ZESTRIL) 10 mg tablet Take 10 mg by mouth daily.   ??? metFORMIN (GLUCOPHAGE) 500 mg tablet Take 500 mg by mouth daily.   ??? pantoprazole DR (PROTONIX) 40 mg tablet Take 40 mg by mouth daily.   ??? phentermine(+) (FASTIN) 30 mg capsule Take 30 mg by mouth every morning.   ??? promethazine (PHENERGAN) 25 mg tablet Take 25 mg by mouth every 6 hours as needed for Nausea or Vomiting.   ??? promethazine-codeine (PHENERGAN W/CODEINE) 6.25-10 mg/5 mL oral syrup Take 5 mL by mouth every 6 hours as needed for Cough.   ??? sertraline (ZOLOFT) 50 mg tablet Take 50 mg by mouth daily.   ??? tiZANidine (ZANAFLEX) 4 mg tablet Take 4 mg by mouth every 8 hours as needed.   ??? zolpidem (AMBIEN) 10 mg tablet Take 10 mg by mouth at bedtime as needed for Sleep.       Allergies:   Allergies   Allergen Reactions   ??? Cherry Flavor HIVES       Social hx:   - Denies tobacco or illicit drug use.  - EtOH: occasionally  - works as an Char[employer] fundraiser    Family hx:   - Paternal grandmother and paternal grandmother with rheumatoid arthritis.  - 4 children -- 51 yo child w/ DM.     Objective:    Vitals:    08/14/17 1032   BP: 138/80   Pulse: 96   Resp: 20   Temp: 36.7 ???C (98.1 ???F)   TempSrc: Oral   SpO2: 100%   Weight: 119.7 kg (264 lb)   Height: 167.6 cm (66) Body mass index is 42.61 kg/m???. and falls within the category of Obesity 3 (>40); specialist visit only, referred back to Primary Care Provider for follow up.      Physical Exam    GEN: Well developed.  Well nourished.  Appropriate affect.  No apparent distress.  Alert.    HEENT: Moist mucous membranes. No oral sores.  Normal exam of external ear. Normal hearing. No parotid or submandibular gland swelling.   EYES: normal conjunctiva.  EOMI.    RESP: Clear to auscultation bilaterally.  No wheezes.  No crackles.  Normal respiratory effort.   CV: regular rate and rhythm.  No murmurs.   EXT: No edema.  Warm.  ABD: Soft.  Nontender.  No distension.      SKIN: No rashes or abnormal skin lesions. Normal nailfold capillaries.    LYMPH: No enlarged cervical lymph nodes  NEURO: No focal neuro deficits. Normal gait. SLRT negative bilaterally.   MsK:    Spine: Normal ROM of cervical spine.  +midline tenderness lumbar spine.  No paraspinal tenderness.  No tenderness over SI joints.    Shoulders: FROM, no tenderness.     Elbows: FROM, no synovitis.     Wrists:  FROM, No synovitis.      Hands: no synovitis. R1st MCP and IP joints are tender. TTP over the palmar aspect of R1st MCP. Negative finkelstein's. No TTP over the radial aspect of her R wrist.     Hips: Normal pain-free ROM    Knees: Normal pain-free ROM. TTP over lateral aspect of L knee.  Ankles: FROM, no synovitis    Feet: no synovitis or tenderness.  No diffuse muscle tenderness or tender points.       Outside records reviewed:   OV note 07/31/16  Fatigue: Patient is on too many medications to when used together can lead to respiratory depression and CNS depression and psychomotor impairment.  I explained to her that these medications are used together dangerous.  I explained to her that all these meds together her what is causing her extreme fatigue.  Patient explained to me that she is taking 2 Fioricet every day, 1-2 Norco as every day, 1 Zanaflex at nighttime, 1 Flexeril during the day, 1 Ambien at night, 2 Xanax during the day, and 1 Zoloft daily.    Labs 07/26/16 WBC 5.3, Hb 12.8, HCT 41.1, platelets 224, ESR 36*  BUN 8, creatinine 0.82, uric acid 4.8, AST 19, ELT 23, alkaline phosphatase 78, TSH 0.5, hemoglobin A1c 6.0, CRP 2.02* (0-0.5)  Antithyroglobulin antibody neg, TPO antibodies neg   Vitamin D 12.9*, UA neg prot, blood neg, wbc 0-2, rbc 0-2, squamous epith 2-5  RNP antibodies neg, smith neg, SSA neg, SSB neg, dsDNA neg, ANA neg   RF neg, anti-CCP IgG/IgA neg   EBV IgM neg, IgG pos*            Assessment and Plan:    Dorothy Todd is a pleasant 36 year old female with history of asthma, hypertension, back pain, diabetes, hypothyroidism, morbid obesity, anxiety/depression who was referred for evaluation of severe fatigue, chronic pain and elevated sed rate.    Chronic low back pain  Chronic pain in multiple joints (B/L knees and L thumb)  Chronic fatigue  Elevated inflammatory markers (ESR, CRP)  Low vitamin D  Morbid obesity  Hx of eczema/psoriasis  Intermittent bilateral leg edema   Dry eyes/dry mouth  Diabetes on Victoza  hypothyroidism    -extensively discussed osteoarthritis/degenerative joint disease versus inflammatory arthritis with the patient.  -Low suspicion for inflammatory arthritis at this time, given lack of joint swelling by history and no synovitis on exam.  -the etiology of her left thumb pain is unclear.  Could be due to tendinitis. Pt is advised to try wearing a thumb brace and Voltaren gel as needed.  -her L knee pain could be 2/2 degenerative joint disease (given hx of previous surgery/procedures) vs periarticular structural problem.   -will obtain labs and x-rays as below.   -Explained to the pt that inflammatory markers are nonspecific and can be elevated in multiple settings, such as infection, obesity.   -sleep clinic referral for evaluation of fatigue and excessive daytime somnolence.       RTC 2-3 months     Orders Placed This Encounter   ??? CHEST 2 VIEWS   ??? KNEE 3 VIEWS BILATERAL, today   ??? HAND MIN 3 VIEWS BILATERAL   ??? L SPINE COMPLETE   ??? SI JOINTS MIN 3 VIEWS   ??? C REACTIVE PROTEIN (CRP) today   ??? SED RATE today   ??? URINALYSIS DIPSTICK REFLEX TO CULTURE   ??? URINALYSIS MICROSCOPIC REFLEX TO CULTURE   ??? UA REFLEX CULTURE LABEL   ??? URINE-SPOT PROT/Cr RATIO today   ??? CBC AND DIFF  today   ??? COMPREHENSIVE METABOLIC PANEL  today   ??? ELECTROPHORESIS-SERUM PROTEIN   ??? TOTAL PROTEIN SEP   ??? 25-OH VITAMIN D (D2 + D3)  today   ??? ANTI-NUCLEAR ANTIBODY(ANA) today   ??? ANTI SSA ANTI SSB AB today   ???  VITAMIN B12 level today   ??? HEPATITIS B SURFACE AG today   ??? HEPATITIS C AB today   ??? HEPATITIS B CORE AB TOT (IGG+IGM) today   ??? IMMUNOFIXATION, SERUM (IFES)   ??? CCP IGG ANTIBODY today   ??? RHEUMATOID FACTOR (RF)  today   ??? PULMONARY   ??? diclofenac(+) (VOLTAREN) 1 % topical gel

## 2017-08-15 LAB — ELECTROPHORESIS-SERUM PROTEIN
Lab: 12 % (ref 5–15)
Lab: 17 % (ref 9–21)
Lab: 3.9 % (ref 2–6)
Lab: 51 % (ref 48–68)
Lab: 7.2 g/dL (ref 6.0–8.0)

## 2017-08-15 LAB — ANTI SSA ANTI SSB AB

## 2017-08-15 LAB — ANTI-NUCLEAR ANTIBODY(ANA): Lab: <80 {titer} — ABNORMAL LOW (ref <80)

## 2017-08-15 LAB — IMMUNOFIXATION, SERUM (IFES)

## 2017-08-15 LAB — CCP IGG ANTIBODY

## 2017-08-16 NOTE — Progress Notes
please notify the pt of her x-ray results and recommendations as below. If she is agreeable, please ask if she is claustrophobic, if she can lay flat for at least 30 mins, if she has any cardiac pacemaker or metal implant. Thank you.       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 No bony abnormality.     Your hand x-ray showed small bone spur formation but otherwise unremarkable. There were no erosions.     The x-ray of your lumbar spine showed mild degenerative changes.    Lastly, your chest x-ray showed no acute heart or lung abnormality.    I would recommend obtaining MRI or CT scan of your sacroiliac joints for further evaluation of the vacuum phenomena.    Sincerely,   Tishie Altmann Darrick Meigs MD

## 2017-08-16 NOTE — Progress Notes
Please notify the pt of her lab results and recommendations as below. Thank you.       Dear Dorothy Todd,     All your lab results have returned.  Your serum protein electrophoresis showed a normal pattern.  Your blood tests for rheumatologic autoimmune diseases were all negative.  Your vitamin D level was mildly low, and I would recommend you take over-the-counter multivitamin supplement.  Your blood tests for hepatitis B and C were negative.  Your vitamin B12 was within the normal range.  Your complete blood count, kidney, and liver function tests were within normal limits.  Both of your inflammatory markers were mildly elevated.  These are nonspecific and can be elevated in multiple settings, including overweight.     Please feel free to contact me, if you have any questions or concerns.    Sincerely,   Dorothy Todd Dorothy Meigs MD

## 2017-08-21 ENCOUNTER — Encounter: Admit: 2017-08-21 | Discharge: 2017-08-21 | Payer: PRIVATE HEALTH INSURANCE

## 2017-08-21 DIAGNOSIS — M545 Low back pain: ICD-10-CM

## 2017-08-21 DIAGNOSIS — R937 Abnormal findings on diagnostic imaging of other parts of musculoskeletal system: Principal | ICD-10-CM

## 2017-08-21 NOTE — Telephone Encounter
Called pt who had already viewed results on mychart. Pt stated she would like MRI and answered questions.  Pt stated she had metal clips on her tubes.  Gave pt MRI sch ph number. Pt stated PCP was wondering if Dr. Darrick Meigs was going to take over any of the medications, specifically for pain and informed pt probably not but would ask.  Pt also asked if Lupus was specifically tested for.    Routing to Dr. Darrick Meigs for review and recommendation on taking over any meds (told pt you weren't, any specific lupus labs, ordering MRI with metal clips on her ovarian tubes?

## 2017-08-21 NOTE — Telephone Encounter
Called Radiology and they stated surgical clips are compatible with MRI.      Ordered MRI    Sent pt mychart message of below

## 2017-08-21 NOTE — Telephone Encounter
I believe that surgical clips are compatible w/ MRI but please call MRI and check with them.    I will defer pain management to her PCP.   Please let pt know that the lupus tests that she previously had done were all negative.   I did a screening test for lupus (ANA) and it was again negative. Therefore, no additional lupus tests are needed.

## 2017-08-21 NOTE — Telephone Encounter
-----   Message from Maureen Chatters, MD sent at 08/16/2017  4:44 PM CDT -----  please notify the pt of her x-ray results and recommendations as below. If she is agreeable, please ask if she is claustrophobic, if she can lay flat for at least 30 mins, if she has any cardiac pacemaker or metal implant. Thank you.       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 No bony abnormality.     Your hand x-ray showed small bone spur formation but otherwise unremarkable. There were no erosions.     The x-ray of your lumbar spine showed mild degenerative changes.    Lastly, your chest x-ray showed no acute heart or lung abnormality.    I would recommend obtaining MRI or CT scan of your sacroiliac joints for further evaluation of the vacuum phenomena.    Sincerely,   Pim Darrick Meigs MD

## 2017-08-21 NOTE — Telephone Encounter
-----   Message from Maureen Chatters, MD sent at 08/16/2017  4:09 PM CDT -----  Please notify the pt of her lab results and recommendations as below. Thank you.       Dear Ms. Colla,     All your lab results have returned.  Your serum protein electrophoresis showed a normal pattern.  Your blood tests for rheumatologic autoimmune diseases were all negative.  Your vitamin D level was mildly low, and I would recommend you take over-the-counter multivitamin supplement.  Your blood tests for hepatitis B and C were negative.  Your vitamin B12 was within the normal range.  Your complete blood count, kidney, and liver function tests were within normal limits.  Both of your inflammatory markers were mildly elevated.  These are nonspecific and can be elevated in multiple settings, including overweight.     Please feel free to contact me, if you have any questions or concerns.    Sincerely,   Pim Darrick Meigs MD

## 2017-08-27 ENCOUNTER — Encounter: Admit: 2017-08-27 | Discharge: 2017-08-27 | Payer: PRIVATE HEALTH INSURANCE

## 2017-08-28 ENCOUNTER — Encounter: Admit: 2017-08-28 | Discharge: 2017-08-28 | Payer: PRIVATE HEALTH INSURANCE

## 2017-08-28 DIAGNOSIS — M2550 Pain in unspecified joint: Principal | ICD-10-CM

## 2017-08-28 NOTE — Telephone Encounter
-----  Message from Janus Molder, RN sent at 08/28/2017  6:20 AM CDT -----  Regarding: FW: Test Results Question  Contact: [phone]      ----- Message -----  From: Priscille Heidelberg  Sent: 08/27/2017   3:11 PM  To: [facility]  Subject: Test Results Question                            Hi Dr. Cloyde Reams,   I've been a little concerned w/my test results. With my auto immune results being negative; will there be any testing on my liver. With my ESR, Cr, Bilirubin levels being elevated; could it possible to have a sickle cell anemia testing done. This body pain I'm having is remarkable and can sometimes be excruciating.  I don't want to be a pain,  especially w/my paternal grandmother having bone cancer with nothing they can do. I just want to be safe than sorry.    Thanks,  Dorothy Todd

## 2017-08-28 NOTE — Telephone Encounter
Routing mychart message to Dr. Darrick MeigsJetanalin for review and recommendations

## 2017-08-30 ENCOUNTER — Ambulatory Visit: Admit: 2017-08-30 | Discharge: 2017-08-30 | Payer: BC Managed Care – PPO

## 2017-08-30 DIAGNOSIS — G8929 Other chronic pain: ICD-10-CM

## 2017-08-30 DIAGNOSIS — R937 Abnormal findings on diagnostic imaging of other parts of musculoskeletal system: Principal | ICD-10-CM

## 2017-08-30 DIAGNOSIS — M545 Low back pain: ICD-10-CM

## 2017-08-30 NOTE — Telephone Encounter
Replying via mychart

## 2017-08-30 NOTE — Progress Notes
Dear Dorothy Todd,    Thank you for having the MRI done.   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      If your back pain is severe, I would recommend further evaluation in spine clinic. Please let me know if you would like a referral to Spine clinic.      Sincerely,   Brandell Maready Darrick MeigsJetanalin MD

## 2017-08-30 NOTE — Telephone Encounter
Dear Ms. Bonser,    Your creatinine and total bilirubin levels were within the normal ranges.   I can order the test for sickle cell anemia if you would like.     Sincerely,   Charice Zuno MD     I have ordered Hb electrophoresis. FYI.

## 2017-09-19 ENCOUNTER — Other Ambulatory Visit (HOSPITAL_BASED_OUTPATIENT_CLINIC_OR_DEPARTMENT_OTHER): Payer: Self-pay | Admitting: Foot & Ankle Surgery

## 2017-09-19 ENCOUNTER — Ambulatory Visit
Admission: RE | Admit: 2017-09-19 | Discharge: 2017-09-19 | Disposition: A | Discharge status: Home / Self Care | Payer: Medicaid Other | Source: Ambulatory Visit | Attending: Foot & Ankle Surgery | Admitting: Foot & Ankle Surgery

## 2017-09-19 DIAGNOSIS — M2011 Hallux valgus (acquired), right foot: Secondary | ICD-10-CM | POA: Insufficient documentation

## 2017-09-19 DIAGNOSIS — M2012 Hallux valgus (acquired), left foot: Secondary | ICD-10-CM

## 2018-02-11 ENCOUNTER — Emergency Department: Admit: 2018-02-11 | Discharge: 2018-02-11 | Discharge status: Against Medical Advice | Payer: BC Managed Care – PPO

## 2018-02-11 DIAGNOSIS — Z5321 Procedure and treatment not carried out due to patient leaving prior to being seen by health care provider: Principal | ICD-10-CM

## 2018-02-28 ENCOUNTER — Encounter: Admit: 2018-02-28 | Discharge: 2018-02-28 | Payer: PRIVATE HEALTH INSURANCE

## 2018-02-28 DIAGNOSIS — R600 Localized edema: Principal | ICD-10-CM

## 2018-02-28 DIAGNOSIS — R0602 Shortness of breath: ICD-10-CM

## 2018-03-01 ENCOUNTER — Encounter: Admit: 2018-03-01 | Discharge: 2018-03-01 | Payer: PRIVATE HEALTH INSURANCE

## 2018-03-04 ENCOUNTER — Encounter: Admit: 2018-03-04 | Discharge: 2018-03-04 | Payer: PRIVATE HEALTH INSURANCE

## 2018-03-05 ENCOUNTER — Encounter: Admit: 2018-03-05 | Discharge: 2018-03-05 | Payer: PRIVATE HEALTH INSURANCE

## 2018-04-30 ENCOUNTER — Encounter: Admit: 2018-04-30 | Discharge: 2018-05-01

## 2018-04-30 ENCOUNTER — Encounter: Admit: 2018-04-30 | Discharge: 2018-04-30 | Payer: PRIVATE HEALTH INSURANCE

## 2018-04-30 ENCOUNTER — Ambulatory Visit: Admit: 2018-04-30 | Discharge: 2018-04-30 | Payer: MEDICAID

## 2018-04-30 DIAGNOSIS — M549 Dorsalgia, unspecified: ICD-10-CM

## 2018-04-30 DIAGNOSIS — J45909 Unspecified asthma, uncomplicated: ICD-10-CM

## 2018-04-30 DIAGNOSIS — E119 Type 2 diabetes mellitus without complications: ICD-10-CM

## 2018-04-30 DIAGNOSIS — G43909 Migraine, unspecified, not intractable, without status migrainosus: Principal | ICD-10-CM

## 2018-04-30 DIAGNOSIS — I10 Essential (primary) hypertension: ICD-10-CM

## 2018-04-30 DIAGNOSIS — F419 Anxiety disorder, unspecified: ICD-10-CM

## 2018-04-30 DIAGNOSIS — F329 Major depressive disorder, single episode, unspecified: ICD-10-CM

## 2018-04-30 DIAGNOSIS — R69 Illness, unspecified: Principal | ICD-10-CM

## 2018-04-30 DIAGNOSIS — E039 Hypothyroidism, unspecified: ICD-10-CM

## 2018-04-30 DIAGNOSIS — F5101 Primary insomnia: Principal | ICD-10-CM

## 2018-04-30 MED ORDER — DOXEPIN 6 MG PO TAB
6 mg | ORAL_TABLET | Freq: Every evening | ORAL | 2 refills | Status: AC
Start: 2018-04-30 — End: 2018-05-08

## 2018-05-01 ENCOUNTER — Encounter: Admit: 2018-05-01 | Discharge: 2018-05-01 | Payer: PRIVATE HEALTH INSURANCE

## 2018-05-09 MED ORDER — RAMELTEON 8 MG PO TAB
8 mg | ORAL_TABLET | Freq: Every evening | ORAL | 2 refills | Status: AC | PRN
Start: 2018-05-09 — End: 2018-09-11

## 2018-07-29 ENCOUNTER — Encounter: Admit: 2018-07-29 | Discharge: 2018-07-29 | Payer: PRIVATE HEALTH INSURANCE

## 2018-08-15 ENCOUNTER — Encounter: Admit: 2018-08-15 | Discharge: 2018-08-15 | Payer: PRIVATE HEALTH INSURANCE

## 2018-08-15 ENCOUNTER — Emergency Department: Admit: 2018-08-15 | Discharge: 2018-08-16 | Disposition: A | Discharge status: Home / Self Care | Payer: MEDICAID

## 2018-08-15 ENCOUNTER — Emergency Department: Admit: 2018-08-15 | Discharge: 2018-08-15 | Payer: PRIVATE HEALTH INSURANCE

## 2018-08-15 DIAGNOSIS — F329 Major depressive disorder, single episode, unspecified: ICD-10-CM

## 2018-08-15 DIAGNOSIS — I10 Essential (primary) hypertension: ICD-10-CM

## 2018-08-15 DIAGNOSIS — E039 Hypothyroidism, unspecified: ICD-10-CM

## 2018-08-15 DIAGNOSIS — M549 Dorsalgia, unspecified: ICD-10-CM

## 2018-08-15 DIAGNOSIS — F419 Anxiety disorder, unspecified: ICD-10-CM

## 2018-08-15 DIAGNOSIS — G43909 Migraine, unspecified, not intractable, without status migrainosus: Principal | ICD-10-CM

## 2018-08-15 DIAGNOSIS — J45909 Unspecified asthma, uncomplicated: ICD-10-CM

## 2018-08-15 DIAGNOSIS — E119 Type 2 diabetes mellitus without complications: ICD-10-CM

## 2018-08-15 MED ORDER — CEPHALEXIN 500 MG PO CAP
500 mg | ORAL_CAPSULE | Freq: Two times a day (BID) | ORAL | 0 refills | Status: AC
Start: 2018-08-15 — End: 2018-09-11

## 2018-08-15 MED ORDER — SODIUM CHLORIDE 0.9 % IV SOLP
1000 mL | INTRAVENOUS | 0 refills | Status: CP
Start: 2018-08-15 — End: ?
  Administered 2018-08-16: 03:00:00 1000 mL via INTRAVENOUS

## 2018-08-15 MED ORDER — IBUPROFEN 600 MG PO TAB
600 mg | Freq: Once | ORAL | 0 refills | Status: DC
Start: 2018-08-15 — End: 2018-08-16

## 2018-08-15 MED ORDER — ACETAMINOPHEN 500 MG PO TAB
1000 mg | Freq: Once | ORAL | 0 refills | Status: CP
Start: 2018-08-15 — End: ?
  Administered 2018-08-16: 03:00:00 1000 mg via ORAL

## 2018-08-16 DIAGNOSIS — N390 Urinary tract infection, site not specified: Principal | ICD-10-CM

## 2018-08-16 LAB — CBC AND DIFF
Lab: 0 10*3/uL (ref 0–0.20)
Lab: 0 10*3/uL (ref 0–0.45)
Lab: 8.9 10*3/uL (ref 4.5–11.0)

## 2018-08-16 LAB — POC GLUCOSE: Lab: 103 mg/dL — ABNORMAL HIGH (ref 70–100)

## 2018-08-16 LAB — COMPREHENSIVE METABOLIC PANEL
Lab: 10 10*3/uL — ABNORMAL HIGH (ref 3–12)
Lab: 133 MMOL/L — ABNORMAL LOW (ref 137–147)
Lab: >60 mL/min (ref >60)
Lab: >60 mL/min — ABNORMAL LOW (ref >60)

## 2018-08-16 LAB — URINALYSIS, MICROSCOPIC

## 2018-08-16 LAB — RAPID STREP A SCREEN: Lab: NEGATIVE g/dL (ref 6.0–8.0)

## 2018-08-16 LAB — POC TROPONIN

## 2018-08-16 LAB — URINALYSIS DIPSTICK
Lab: NEGATIVE MMOL/L — ABNORMAL LOW (ref 21–30)
Lab: NEGATIVE U/L (ref 7–56)

## 2018-08-16 LAB — POC LACTATE: Lab: 0.9 MMOL/L (ref 0.5–2.0)

## 2018-08-17 LAB — CULTURE-STREP SCREEN

## 2018-09-02 ENCOUNTER — Encounter (INDEPENDENT_AMBULATORY_CARE_PROVIDER_SITE_OTHER): Payer: Self-pay

## 2018-09-02 ENCOUNTER — Ambulatory Visit (INDEPENDENT_AMBULATORY_CARE_PROVIDER_SITE_OTHER): Payer: No Typology Code available for payment source | Admitting: Family

## 2018-09-02 VITALS — BP 133/72 | HR 60 | Temp 98.3°F | Resp 16 | Ht 68.0 in | Wt 155.0 lb

## 2018-09-02 DIAGNOSIS — R51 Headache: Secondary | ICD-10-CM

## 2018-09-02 DIAGNOSIS — R519 Headache, unspecified: Secondary | ICD-10-CM

## 2018-09-02 MED ORDER — ONDANSETRON 4 MG PO TBDP
4.0000 mg | ORAL_TABLET | Freq: Three times a day (TID) | ORAL | 0 refills | Status: DC | PRN
Start: 2018-09-02 — End: 2021-11-27

## 2018-09-02 NOTE — Progress Notes (Signed)
Subjective:    Patient ID: Maria Wise is a 37 y.o. female.    C/o headache started about 3 days ago.  8 of 10 right now.  States she is "exhausted."    States her BP has been 170/100s, for months, when it is checked she goes to the [facility] clinic.  She is worried her HA is r/t her BP.  She has not been Dx w/ HTN, and has never been on medications.    Mother asking for a work note for the patient.      Headache    This is a new problem. The pain is located in the parietal region. The pain does not radiate. The pain quality is similar to prior headaches. Associated symptoms include coughing (a little bit more this weekend. dry cough). Pertinent negatives include no abnormal behavior, fever, nausea or vomiting. Treatments tried: ibuprofen 600 mg.   Hypertension   This is a new problem. Associated symptoms include headaches. Pertinent negatives include no chest pain or shortness of breath.       The following portions of the patient's history were reviewed and updated as appropriate: allergies, current medications, past family history, past medical history, past social history, past surgical history and problem list.    Review of Systems   Constitutional: Positive for fatigue. Negative for chills and fever.   HENT: Negative for congestion.    Eyes: Negative for visual disturbance.   Respiratory: Positive for cough (a little bit more this weekend. dry cough). Negative for shortness of breath.    Cardiovascular: Negative for chest pain.   Gastrointestinal: Positive for constipation (all the time. no worse than normal). Negative for diarrhea, nausea and vomiting.   Genitourinary: Positive for menstrual problem. Negative for dysuria, frequency and urgency.   Musculoskeletal: Negative for myalgias.   Skin: Negative for rash.   Neurological: Positive for headaches. Negative for syncope.     Allergies   Allergen Reactions   . Penicillins      HIVES   . Propoxyphene      HIVES   . Tramadol Swelling       Past  Medical History:   Diagnosis Date   . No known health problems        Past Surgical History:   Procedure Laterality Date   . CYST REMOVAL         Family History   Problem Relation Age of Onset   . No known problems Mother    . No known problems Father            Objective:    BP 133/72   Pulse 60   Temp 98.3 F (36.8 C) (Oral)   Resp 16   Ht 1.727 m (5\' 8" )   Wt 70.3 kg (155 lb)   LMP 08/26/2018   BMI 23.57 kg/m     Physical Exam   Constitutional: She is oriented to person, place, and time. Vital signs are normal. She appears well-developed and well-nourished.  Non-toxic appearance. She does not have a sickly appearance. She does not appear ill. No distress.   HENT:   Head: Normocephalic.   Eyes: Pupils are equal, round, and reactive to light. Conjunctivae and EOM are normal. Right eye exhibits no discharge. Left eye exhibits no discharge. Right conjunctiva is not injected. Right conjunctiva has no hemorrhage. Left conjunctiva is not injected. Left conjunctiva has no hemorrhage. No scleral icterus. Right eye exhibits normal extraocular motion and no nystagmus. Left eye exhibits  normal extraocular motion and no nystagmus. Right pupil is round and reactive. Left pupil is round and reactive. Pupils are equal.   Cardiovascular: Normal rate, regular rhythm and normal heart sounds.    Pulmonary/Chest: Effort normal and breath sounds normal. No tachypnea and no bradypnea. No respiratory distress. She has no decreased breath sounds. She has no wheezes. She has no rhonchi. She has no rales.   Neurological: She is alert and oriented to person, place, and time.   Skin: Skin is warm, dry and intact. No rash noted.   Psychiatric: She has a normal mood and affect.   Nursing note and vitals reviewed.        Assessment and Plan:       Katheen was seen today for headache and hypertension.    Diagnoses and all orders for this visit:    Acute nonintractable headache, unspecified headache type  -     ondansetron (ZOFRAN-ODT) 4  MG disintegrating tablet; Take 1 tablet (4 mg total) by mouth every 8 (eight) hours as needed for Nausea      -BP normal today.    -Discussed that hypertension is the silent killer. You should check you BP when you go to the store, and keep a record to bring to your PCP. Patient educated to follow up to prevent long term CV problems.    -Discussed symptomatic (CP/SOB/palpitations/HA) elevated BP is a reason to go to the ED.    -On exam today the patient is neurologically intact.  -Take Tylenol or Ibuprofen every 4-6 hours as needed for pain or fever. Try ibuprofen 400 mg and tylenol 500 mg together for headache BID.  -Drink lots of water, monitor PO intake and urine output for hydration.  -Zofran ODT for nausea.  -Go to the ED for vision changes, "the worse headache of my life", and/or any other concerns.  -Work note provided.    Go to the ER for any new or worsening symptoms that concern you.  Follow-up with your primary care doctor or return to Urgent Care if your symptoms do not improve.    Patient agrees with the plan.            Carolin Sicks, NP  [facility]  09/02/2018  5:14 PM

## 2018-09-02 NOTE — Patient Instructions (Signed)
Headache, Unspecified    A number of things can cause headaches. The cause of your headache isn't clear. But it doesn't seem to be a sign of any serious illness. Headache affects almost everyone at some time. It is the most common reason people miss days from work or school.   You could have a tension headache or a migraine headache.  Stress can cause a tension headache. This can happen if you tense the muscles of your shoulders, neck, and scalp without knowing it. If this stress lasts long enough, you may develop a tension headache.  It is not clear why migraines occur, but certain things called" triggers" can raise the risk of having a migraine attack. Migraine triggers may include emotional stress or depression, or by hormone changes during the menstrual cycle. Other triggers include birth control pills and other medicines, alcohol or caffeine, foods with tyramine (such as aged cheese, wine), eyestrain, weather changes, missed meals, and lack of sleep or oversleeping.  Other causes of headache include:   Viral illness with high fever   Head injury with concussion   Sinus, ear, or throat infection   Dental pain and jaw joint (TMJ) pain  More serious but less common causes of headache include stroke, brain hemorrhage, brain tumor, meningitis, and encephalitis.  Home care  Follow these tips when taking care of yourself at home:   Don't drive yourself home if you were given pain medicine for your headache. Instead, have someone else drive you home. Try to sleep when you get home. You should feel much better when you wake up.   Apply heat to the back of your neck to ease a neck muscle spasm. Take care of a migraine headache by putting an ice pack on your forehead or at the base of your skull.   If you have nausea or vomiting, eat a light diet until your headache eases.   If you have a migraine headache, use sunglasses when in the daylight or around bright indoor lighting until your symptoms get better.  Bright glaring light can make this type of headache worse.  Follow-up care  Follow up with your healthcare provider, or as advised. Talk with your provider if you have frequent headaches. He or she can help figure out a treatment plan. By knowing the earliest signs of headache, and starting treatment right away, you may be able to stop the pain yourself.  When to seek medical advice  Call your healthcare provider right awayif any of these occur:   Your head pain suddenly gets worse after sexual intercourse or strenuous activity   Your head pain doesn't get better within 24 hours   You aren't able to keep liquids down (repeated vomiting)   Fever of 100.4F (38C) or higher, or as directed by your healthcare provider   Stiff neck   Extreme drowsiness, confusion, or fainting   Dizziness or dizziness with spinning sensation (vertigo)   Weakness in an arm or leg or one side of your face   You have trouble talking or seeing  Date Last Reviewed: 07/26/2015   2000-2019 [employer] [address], [place] [address]. All rights reserved. This information is not intended as a substitute for professional medical care. Always follow your healthcare professional's instructions.

## 2018-09-03 ENCOUNTER — Ambulatory Visit: Admit: 2018-09-03 | Discharge: 2018-09-04 | Payer: MEDICAID

## 2018-09-03 ENCOUNTER — Encounter: Admit: 2018-09-03 | Discharge: 2018-09-03 | Payer: PRIVATE HEALTH INSURANCE

## 2018-09-03 DIAGNOSIS — F329 Major depressive disorder, single episode, unspecified: ICD-10-CM

## 2018-09-03 DIAGNOSIS — G43909 Migraine, unspecified, not intractable, without status migrainosus: Principal | ICD-10-CM

## 2018-09-03 DIAGNOSIS — J324 Chronic pansinusitis: Principal | ICD-10-CM

## 2018-09-03 DIAGNOSIS — M549 Dorsalgia, unspecified: ICD-10-CM

## 2018-09-03 DIAGNOSIS — F419 Anxiety disorder, unspecified: ICD-10-CM

## 2018-09-03 DIAGNOSIS — J45909 Unspecified asthma, uncomplicated: ICD-10-CM

## 2018-09-03 DIAGNOSIS — R69 Illness, unspecified: Principal | ICD-10-CM

## 2018-09-03 DIAGNOSIS — E119 Type 2 diabetes mellitus without complications: ICD-10-CM

## 2018-09-03 DIAGNOSIS — I10 Essential (primary) hypertension: ICD-10-CM

## 2018-09-03 DIAGNOSIS — E039 Hypothyroidism, unspecified: ICD-10-CM

## 2018-09-04 ENCOUNTER — Encounter: Admit: 2018-09-04 | Discharge: 2018-09-04 | Payer: PRIVATE HEALTH INSURANCE

## 2018-09-04 DIAGNOSIS — J323 Chronic sphenoidal sinusitis: ICD-10-CM

## 2018-09-04 DIAGNOSIS — J310 Chronic rhinitis: Principal | ICD-10-CM

## 2018-09-05 ENCOUNTER — Telehealth (INDEPENDENT_AMBULATORY_CARE_PROVIDER_SITE_OTHER): Payer: Self-pay

## 2018-09-05 NOTE — Telephone Encounter (Signed)
Follow up call made to patient. Spoke with patient. Feeling better. Let them know to call us back with any questions or concerns.

## 2018-09-11 ENCOUNTER — Encounter: Admit: 2018-09-11 | Discharge: 2018-09-11 | Payer: PRIVATE HEALTH INSURANCE

## 2018-09-11 ENCOUNTER — Ambulatory Visit: Admit: 2018-09-11 | Discharge: 2018-09-11 | Payer: MEDICAID

## 2018-09-11 ENCOUNTER — Ambulatory Visit: Admit: 2018-09-11 | Discharge: 2018-09-11 | Payer: PRIVATE HEALTH INSURANCE

## 2018-09-11 DIAGNOSIS — F329 Major depressive disorder, single episode, unspecified: ICD-10-CM

## 2018-09-11 DIAGNOSIS — R600 Localized edema: ICD-10-CM

## 2018-09-11 DIAGNOSIS — F419 Anxiety disorder, unspecified: ICD-10-CM

## 2018-09-11 DIAGNOSIS — M549 Dorsalgia, unspecified: ICD-10-CM

## 2018-09-11 DIAGNOSIS — J45909 Unspecified asthma, uncomplicated: ICD-10-CM

## 2018-09-11 DIAGNOSIS — E039 Hypothyroidism, unspecified: ICD-10-CM

## 2018-09-11 DIAGNOSIS — I10 Essential (primary) hypertension: Principal | ICD-10-CM

## 2018-09-11 DIAGNOSIS — E119 Type 2 diabetes mellitus without complications: ICD-10-CM

## 2018-09-11 DIAGNOSIS — G43909 Migraine, unspecified, not intractable, without status migrainosus: Principal | ICD-10-CM

## 2018-09-11 MED ORDER — HYDROCHLOROTHIAZIDE 25 MG PO TAB
25 mg | ORAL_TABLET | Freq: Every morning | ORAL | 3 refills | 28.00000 days | Status: AC
Start: 2018-09-11 — End: 2019-07-10

## 2018-09-12 ENCOUNTER — Ambulatory Visit: Admit: 2018-09-18 | Discharge: 2018-09-18 | Payer: PRIVATE HEALTH INSURANCE

## 2018-09-12 ENCOUNTER — Encounter: Admit: 2018-09-12 | Discharge: 2018-09-12 | Payer: PRIVATE HEALTH INSURANCE

## 2018-09-12 ENCOUNTER — Ambulatory Visit: Admit: 2018-09-12 | Discharge: 2018-09-13 | Payer: MEDICAID

## 2018-09-12 DIAGNOSIS — M1990 Unspecified osteoarthritis, unspecified site: ICD-10-CM

## 2018-09-12 DIAGNOSIS — I2510 Atherosclerotic heart disease of native coronary artery without angina pectoris: ICD-10-CM

## 2018-09-12 DIAGNOSIS — G43909 Migraine, unspecified, not intractable, without status migrainosus: Principal | ICD-10-CM

## 2018-09-12 DIAGNOSIS — J45909 Unspecified asthma, uncomplicated: ICD-10-CM

## 2018-09-12 DIAGNOSIS — E039 Hypothyroidism, unspecified: ICD-10-CM

## 2018-09-12 DIAGNOSIS — I10 Essential (primary) hypertension: ICD-10-CM

## 2018-09-12 DIAGNOSIS — F419 Anxiety disorder, unspecified: ICD-10-CM

## 2018-09-12 DIAGNOSIS — E785 Hyperlipidemia, unspecified: ICD-10-CM

## 2018-09-12 DIAGNOSIS — K929 Disease of digestive system, unspecified: ICD-10-CM

## 2018-09-12 DIAGNOSIS — M549 Dorsalgia, unspecified: ICD-10-CM

## 2018-09-12 DIAGNOSIS — J349 Unspecified disorder of nose and nasal sinuses: ICD-10-CM

## 2018-09-12 DIAGNOSIS — E119 Type 2 diabetes mellitus without complications: ICD-10-CM

## 2018-09-12 DIAGNOSIS — F329 Major depressive disorder, single episode, unspecified: ICD-10-CM

## 2018-09-18 ENCOUNTER — Ambulatory Visit: Admit: 2018-09-18 | Discharge: 2018-09-18 | Payer: MEDICAID

## 2018-09-18 ENCOUNTER — Encounter: Admit: 2018-09-18 | Discharge: 2018-09-18 | Payer: PRIVATE HEALTH INSURANCE

## 2018-09-18 ENCOUNTER — Ambulatory Visit: Admit: 2018-09-12 | Discharge: 2018-09-12 | Payer: PRIVATE HEALTH INSURANCE

## 2018-09-18 DIAGNOSIS — E039 Hypothyroidism, unspecified: ICD-10-CM

## 2018-09-18 DIAGNOSIS — J324 Chronic pansinusitis: ICD-10-CM

## 2018-09-18 DIAGNOSIS — J349 Unspecified disorder of nose and nasal sinuses: ICD-10-CM

## 2018-09-18 DIAGNOSIS — J45909 Unspecified asthma, uncomplicated: ICD-10-CM

## 2018-09-18 DIAGNOSIS — Z794 Long term (current) use of insulin: ICD-10-CM

## 2018-09-18 DIAGNOSIS — K929 Disease of digestive system, unspecified: ICD-10-CM

## 2018-09-18 DIAGNOSIS — I10 Essential (primary) hypertension: ICD-10-CM

## 2018-09-18 DIAGNOSIS — E119 Type 2 diabetes mellitus without complications: ICD-10-CM

## 2018-09-18 DIAGNOSIS — E785 Hyperlipidemia, unspecified: ICD-10-CM

## 2018-09-18 DIAGNOSIS — J329 Chronic sinusitis, unspecified: Principal | ICD-10-CM

## 2018-09-18 DIAGNOSIS — F419 Anxiety disorder, unspecified: ICD-10-CM

## 2018-09-18 DIAGNOSIS — M1990 Unspecified osteoarthritis, unspecified site: ICD-10-CM

## 2018-09-18 DIAGNOSIS — I2510 Atherosclerotic heart disease of native coronary artery without angina pectoris: ICD-10-CM

## 2018-09-18 DIAGNOSIS — F329 Major depressive disorder, single episode, unspecified: ICD-10-CM

## 2018-09-18 DIAGNOSIS — G43909 Migraine, unspecified, not intractable, without status migrainosus: Principal | ICD-10-CM

## 2018-09-18 DIAGNOSIS — M549 Dorsalgia, unspecified: ICD-10-CM

## 2018-09-18 LAB — POC GLUCOSE
Lab: 81 mg/dL (ref 70–100)
Lab: 90 mg/dL (ref 70–100)

## 2018-09-18 LAB — PREGNANCY TEST-URINE
Lab: NEGATIVE
Lab: 1

## 2018-09-18 MED ORDER — OXYCODONE 5 MG PO TAB
5 mg | ORAL_TABLET | ORAL | 0 refills | 6.00000 days | Status: AC | PRN
Start: 2018-09-18 — End: ?

## 2018-09-18 MED ORDER — DIPHENHYDRAMINE HCL 50 MG/ML IJ SOLN
25 mg | Freq: Once | INTRAVENOUS | 0 refills | Status: DC | PRN
Start: 2018-09-18 — End: 2018-09-19

## 2018-09-18 MED ORDER — DEXTRAN 70-HYPROMELLOSE (PF) 0.1-0.3 % OP DPET
0 refills | Status: DC
Start: 2018-09-18 — End: 2018-09-18
  Administered 2018-09-18: 18:00:00 2 [drp] via OPHTHALMIC

## 2018-09-18 MED ORDER — FENTANYL CITRATE (PF) 50 MCG/ML IJ SOLN
50 ug | INTRAVENOUS | 0 refills | Status: DC | PRN
Start: 2018-09-18 — End: 2018-09-19

## 2018-09-18 MED ORDER — ACETAMINOPHEN 325 MG PO TAB
650 mg | Freq: Once | ORAL | 0 refills | Status: CP
Start: 2018-09-18 — End: ?
  Administered 2018-09-18: 21:00:00 650 mg via ORAL

## 2018-09-18 MED ORDER — MIDAZOLAM 1 MG/ML IJ SOLN
INTRAVENOUS | 0 refills | Status: DC
Start: 2018-09-18 — End: 2018-09-18
  Administered 2018-09-18: 18:00:00 2 mg via INTRAVENOUS

## 2018-09-18 MED ORDER — ACETAMINOPHEN 325 MG PO TAB
325 mg | Freq: Once | ORAL | 0 refills | Status: CP
Start: 2018-09-18 — End: ?
  Administered 2018-09-18: 22:00:00 325 mg via ORAL

## 2018-09-18 MED ORDER — ONDANSETRON HCL (PF) 4 MG/2 ML IJ SOLN
INTRAVENOUS | 0 refills | Status: DC
Start: 2018-09-18 — End: 2018-09-18
  Administered 2018-09-18: 20:00:00 4 mg via INTRAVENOUS

## 2018-09-18 MED ORDER — METHYLPREDNISOLONE 4 MG PO DSPK
ORAL_TABLET | 0 refills | Status: AC
Start: 2018-09-18 — End: ?
  Filled 2018-09-18 (×2): qty 21, 6d supply, fill #1

## 2018-09-18 MED ORDER — ROCURONIUM 10 MG/ML IV SOLN
INTRAVENOUS | 0 refills | Status: DC
Start: 2018-09-18 — End: 2018-09-18
  Administered 2018-09-18: 18:00:00 60 mg via INTRAVENOUS

## 2018-09-18 MED ORDER — HYDROMORPHONE (PF) 2 MG/ML IJ SYRG
.5 mg | INTRAVENOUS | 0 refills | Status: DC | PRN
Start: 2018-09-18 — End: 2018-09-19
  Administered 2018-09-18 (×4): 0.5 mg via INTRAVENOUS

## 2018-09-18 MED ORDER — LACTATED RINGERS IV SOLP
0 refills | Status: DC
Start: 2018-09-18 — End: 2018-09-18
  Administered 2018-09-18: 19:00:00 via INTRAVENOUS

## 2018-09-18 MED ORDER — LIDOCAINE (PF) 10 MG/ML (1 %) IJ SOLN
.1-2 mL | INTRAMUSCULAR | 0 refills | Status: DC | PRN
Start: 2018-09-18 — End: 2018-09-19

## 2018-09-18 MED ORDER — THROMBIN 5000 UNITS NS 20 ML (OR)
0 refills | Status: DC
Start: 2018-09-18 — End: 2018-09-18
  Administered 2018-09-18 (×2): 20 mL via TOPICAL

## 2018-09-18 MED ORDER — OXYCODONE 5 MG PO TAB
5 mg | Freq: Once | ORAL | 0 refills | Status: CP
Start: 2018-09-18 — End: ?
  Administered 2018-09-18: 22:00:00 5 mg via ORAL

## 2018-09-18 MED ORDER — AMOXICILLIN-POT CLAVULANATE 875-125 MG PO TAB
1 | ORAL_TABLET | Freq: Two times a day (BID) | ORAL | 0 refills | 7.00000 days | Status: AC
Start: 2018-09-18 — End: ?
  Filled 2018-09-18 (×2): qty 14, 7d supply, fill #1

## 2018-09-18 MED ORDER — DEXAMETHASONE SODIUM PHOSPHATE 4 MG/ML IJ SOLN
INTRAVENOUS | 0 refills | Status: DC
Start: 2018-09-18 — End: 2018-09-18
  Administered 2018-09-18: 19:00:00 4 mg via INTRAVENOUS

## 2018-09-18 MED ORDER — FENTANYL CITRATE (PF) 50 MCG/ML IJ SOLN
0 refills | Status: DC
Start: 2018-09-18 — End: 2018-09-18
  Administered 2018-09-18: 18:00:00 50 ug via INTRAVENOUS

## 2018-09-18 MED ORDER — SODIUM CHLORIDE-ALOE VERA NA SPRY
1-2 | Freq: Four times a day (QID) | NASAL | 0 refills | 30.00000 days | Status: AC
Start: 2018-09-18 — End: ?

## 2018-09-18 MED ORDER — ACETAMINOPHEN 325 MG PO TAB
650 mg | ORAL_TABLET | ORAL | 0 refills | Status: AC | PRN
Start: 2018-09-18 — End: ?
  Filled 2018-09-18 (×2): qty 30, 3d supply, fill #1

## 2018-09-18 MED ORDER — SODIUM CHLORIDE 0.9 % IV SOLP
1000 mL | INTRAVENOUS | 0 refills | Status: DC
Start: 2018-09-18 — End: 2018-09-19
  Administered 2018-09-18: 17:00:00 1000 mL via INTRAVENOUS

## 2018-09-18 MED ORDER — LIDOCAINE-EPINEPHRINE 1 %-1:100,000 IJ SOLN
0 refills | Status: DC
Start: 2018-09-18 — End: 2018-09-18
  Administered 2018-09-18: 20:00:00 4 mL via INTRAMUSCULAR

## 2018-09-18 MED ORDER — FENTANYL CITRATE (PF) 50 MCG/ML IJ SOLN
25 ug | INTRAVENOUS | 0 refills | Status: DC | PRN
Start: 2018-09-18 — End: 2018-09-19

## 2018-09-18 MED ORDER — PROMETHAZINE 25 MG/ML IJ SOLN
6.25 mg | INTRAVENOUS | 0 refills | Status: DC | PRN
Start: 2018-09-18 — End: 2018-09-19

## 2018-09-18 MED ORDER — EPINEPHRINE 1 MG/ML IJ SOLN
0 refills | Status: DC
Start: 2018-09-18 — End: 2018-09-18
  Administered 2018-09-18: 19:00:00 2 mg

## 2018-09-18 MED ORDER — LIDOCAINE (PF) 200 MG/10 ML (2 %) IJ SYRG
0 refills | Status: DC
Start: 2018-09-18 — End: 2018-09-18
  Administered 2018-09-18: 18:00:00 100 mg via INTRAVENOUS

## 2018-09-18 MED ORDER — PROPOFOL INJ 10 MG/ML IV VIAL
0 refills | Status: DC
Start: 2018-09-18 — End: 2018-09-18
  Administered 2018-09-18: 18:00:00 150 mg via INTRAVENOUS
  Administered 2018-09-18: 19:00:00 10 mg via INTRAVENOUS
  Administered 2018-09-18: 18:00:00 40 mg via INTRAVENOUS

## 2018-09-18 MED ORDER — ONDANSETRON 4 MG PO TBDI
4 mg | ORAL_TABLET | ORAL | 0 refills | 8.00000 days | Status: AC | PRN
Start: 2018-09-18 — End: ?
  Filled 2018-09-18 (×2): qty 10, 4d supply, fill #1

## 2018-09-18 MED ORDER — SUGAMMADEX 100 MG/ML IV SOLN
INTRAVENOUS | 0 refills | Status: DC
Start: 2018-09-18 — End: 2018-09-18

## 2018-09-18 MED ORDER — REMIFENTANYL 1000MCG IN NS 20ML (OR)
0 refills | Status: DC
Start: 2018-09-18 — End: 2018-09-18
  Administered 2018-09-18 (×2): .05 ug/kg/min via INTRAVENOUS

## 2018-09-18 MED ORDER — OXYCODONE 5 MG PO TAB
5 mg | Freq: Once | ORAL | 0 refills | Status: CP
Start: 2018-09-18 — End: ?
  Administered 2018-09-18: 21:00:00 5 mg via ORAL

## 2018-09-18 MED ORDER — CEFAZOLIN 1 GRAM IJ SOLR
0 refills | Status: DC
Start: 2018-09-18 — End: 2018-09-18
  Administered 2018-09-18: 19:00:00 2 g via INTRAVENOUS

## 2018-09-18 MED ORDER — OXYMETAZOLINE 0.05 % NA SPRY
0 refills | Status: DC
Start: 2018-09-18 — End: 2018-09-18
  Administered 2018-09-18: 19:00:00 3 via NASAL

## 2018-09-19 LAB — GRAM STAIN

## 2018-09-20 ENCOUNTER — Encounter: Admit: 2018-09-20 | Discharge: 2018-09-20 | Payer: PRIVATE HEALTH INSURANCE

## 2018-09-20 DIAGNOSIS — F419 Anxiety disorder, unspecified: ICD-10-CM

## 2018-09-20 DIAGNOSIS — E039 Hypothyroidism, unspecified: ICD-10-CM

## 2018-09-20 DIAGNOSIS — E119 Type 2 diabetes mellitus without complications: ICD-10-CM

## 2018-09-20 DIAGNOSIS — F329 Major depressive disorder, single episode, unspecified: ICD-10-CM

## 2018-09-20 DIAGNOSIS — E785 Hyperlipidemia, unspecified: ICD-10-CM

## 2018-09-20 DIAGNOSIS — M1990 Unspecified osteoarthritis, unspecified site: ICD-10-CM

## 2018-09-20 DIAGNOSIS — I2510 Atherosclerotic heart disease of native coronary artery without angina pectoris: ICD-10-CM

## 2018-09-20 DIAGNOSIS — I10 Essential (primary) hypertension: ICD-10-CM

## 2018-09-20 DIAGNOSIS — J45909 Unspecified asthma, uncomplicated: ICD-10-CM

## 2018-09-20 DIAGNOSIS — M549 Dorsalgia, unspecified: ICD-10-CM

## 2018-09-20 DIAGNOSIS — J349 Unspecified disorder of nose and nasal sinuses: ICD-10-CM

## 2018-09-20 DIAGNOSIS — G43909 Migraine, unspecified, not intractable, without status migrainosus: Principal | ICD-10-CM

## 2018-09-20 DIAGNOSIS — K929 Disease of digestive system, unspecified: ICD-10-CM

## 2018-09-21 LAB — CULTURE-WOUND/TISSUE/FLUID(AEROBIC ONLY)W/SENSITIVITY

## 2018-09-23 LAB — CULTURE-ANAEROBIC

## 2018-09-24 MED ORDER — PREDNISONE 10 MG PO TAB
ORAL_TABLET | Freq: Every day | 0 refills | Status: AC
Start: 2018-09-24 — End: ?

## 2018-09-25 ENCOUNTER — Ambulatory Visit: Admit: 2018-09-24 | Discharge: 2018-09-25 | Payer: MEDICAID

## 2018-09-25 DIAGNOSIS — J324 Chronic pansinusitis: Principal | ICD-10-CM

## 2018-09-25 DIAGNOSIS — J310 Chronic rhinitis: ICD-10-CM

## 2018-09-27 LAB — CULTURE-FUNGAL,OTHER

## 2018-10-29 ENCOUNTER — Encounter (INDEPENDENT_AMBULATORY_CARE_PROVIDER_SITE_OTHER): Payer: Self-pay

## 2018-10-29 ENCOUNTER — Ambulatory Visit (INDEPENDENT_AMBULATORY_CARE_PROVIDER_SITE_OTHER): Payer: No Typology Code available for payment source | Admitting: Physician Assistant

## 2018-10-29 ENCOUNTER — Ambulatory Visit: Admit: 2018-10-29 | Discharge: 2018-10-30 | Payer: MEDICAID

## 2018-10-29 ENCOUNTER — Encounter: Admit: 2018-10-29 | Discharge: 2018-10-29 | Payer: PRIVATE HEALTH INSURANCE

## 2018-10-29 VITALS — BP 139/81 | HR 60 | Temp 98.4°F | Resp 20 | Ht 68.0 in | Wt 147.0 lb

## 2018-10-29 DIAGNOSIS — J069 Acute upper respiratory infection, unspecified: Secondary | ICD-10-CM

## 2018-10-29 DIAGNOSIS — B9789 Other viral agents as the cause of diseases classified elsewhere: Secondary | ICD-10-CM

## 2018-10-29 DIAGNOSIS — J349 Unspecified disorder of nose and nasal sinuses: ICD-10-CM

## 2018-10-29 DIAGNOSIS — F419 Anxiety disorder, unspecified: ICD-10-CM

## 2018-10-29 DIAGNOSIS — E119 Type 2 diabetes mellitus without complications: ICD-10-CM

## 2018-10-29 DIAGNOSIS — M549 Dorsalgia, unspecified: ICD-10-CM

## 2018-10-29 DIAGNOSIS — J45909 Unspecified asthma, uncomplicated: ICD-10-CM

## 2018-10-29 DIAGNOSIS — E785 Hyperlipidemia, unspecified: ICD-10-CM

## 2018-10-29 DIAGNOSIS — M1990 Unspecified osteoarthritis, unspecified site: ICD-10-CM

## 2018-10-29 DIAGNOSIS — F329 Major depressive disorder, single episode, unspecified: ICD-10-CM

## 2018-10-29 DIAGNOSIS — K929 Disease of digestive system, unspecified: ICD-10-CM

## 2018-10-29 DIAGNOSIS — I2510 Atherosclerotic heart disease of native coronary artery without angina pectoris: ICD-10-CM

## 2018-10-29 DIAGNOSIS — I10 Essential (primary) hypertension: ICD-10-CM

## 2018-10-29 DIAGNOSIS — G43909 Migraine, unspecified, not intractable, without status migrainosus: Principal | ICD-10-CM

## 2018-10-29 DIAGNOSIS — E039 Hypothyroidism, unspecified: ICD-10-CM

## 2018-10-29 MED ORDER — PREDNISONE 10 MG PO TABS
30.0000 mg | ORAL_TABLET | Freq: Every day | ORAL | 0 refills | Status: AC
Start: 2018-10-29 — End: 2018-11-03

## 2018-10-29 MED ORDER — AZELASTINE 137 MCG (0.1 %) NA SPRA
2 | Freq: Two times a day (BID) | NASAL | 3 refills | 50.00000 days | Status: AC
Start: 2018-10-29 — End: 2019-10-08

## 2018-10-29 MED ORDER — FLUTICASONE PROPIONATE 50 MCG/ACTUATION NA SPSN
2 | Freq: Two times a day (BID) | NASAL | 3 refills | 60.00000 days | Status: AC
Start: 2018-10-29 — End: ?

## 2018-10-29 NOTE — Patient Instructions (Signed)
Viral Upper Respiratory Illness (Adult)    You have a viral upper respiratory illness (URI), which is another term for the common cold. This illness is contagious during the first few days. It is spread through the air by coughing and sneezing. It may also be spread by direct contact (touching the sick person and then touching your own eyes, nose, or mouth). Frequent handwashing will decrease risk of spread. Most viral illnesses go away within 7 to 10 days with rest and simple home remedies. Sometimes the illness may last for several weeks. Antibiotics will not kill a virus, and they are generally not prescribed for this condition.  Home care   If symptoms are severe, rest at home for the first 2 to 3 days. When you resume activity, don't let yourself get too tired.   Don't smoke. If you need help stopping, talk with your healthcare provider.   Avoid being exposed to cigarette smoke (yours or others').   You may use acetaminophen or ibuprofen to control pain and fever, unless another medicine was prescribed.If you have chronic liver or kidney disease, have ever had a stomach ulcer or gastrointestinal bleeding, or are taking blood-thinning medicines, talk with your healthcare provider before using these medicines. Aspirin should never be given to anyone under 18 years of age who is ill with a viral infection or fever. It may cause severe liver or brain damage.   Your appetite may be poor, so a light diet is fine. Stay well hydrated by drinking 6 to 8 glasses of fluids per day (water, soft drinks, juices, tea, or soup). Extra fluids will help loosen secretions in the nose and lungs.   Over-the-counter cold medicines will not shorten the length of time you're sick, but they may be helpful for the following symptoms: cough, sore throat, and nasal and sinus congestion. If you take prescription medicines, ask your healthcare provider or pharmacist which over-the-counter medicines are safe to use. (Note: Don't use  decongestants if you have high blood pressure.)  Follow-up care  Follow up with your healthcare provider, or as advised.  When to seek medical advice  Call your healthcare provider right away if any of these occur:   Cough with lots of colored sputum (mucus)   Severe headache; face, neck, or ear pain   Difficultyswallowingdue to throat pain   Fever of 100.4F (38C) or higher, or as directed by your healthcare provider  Call 911  Call 911 if any of these occur:   Chest pain, shortness of breath, wheezing, or difficulty breathing   Coughing up blood   Very severe pain with swallowing, especially if it goes along with a muffled voice   [employer] last reviewed this educational content on 05/25/2017   2000-2019 [employer] [address], [place] [address]. All rights reserved. This information is not intended as a substitute for professional medical care. Always follow your healthcare professional's instructions.

## 2018-10-29 NOTE — Progress Notes (Signed)
Subjective:    Patient ID: Maria Wise is a 37 y.o. female.    Cough   This is a new problem. Episode onset: 1 week. The problem has been gradually improving. The cough is productive of sputum. Associated symptoms include nasal congestion, postnasal drip and rhinorrhea. Pertinent negatives include no chest pain, chills, ear congestion, ear pain, eye redness, fever, headaches, heartburn, hemoptysis, myalgias, rash, sore throat, shortness of breath, sweats, weight loss or wheezing. The symptoms are aggravated by lying down. Risk factors for lung disease include smoking/tobacco exposure. Treatments tried: tussionex x 2 d. The treatment provided moderate relief. There is no history of asthma, bronchitis or COPD.     1 ppd smoker.    Past Medical History:   Diagnosis Date   . No known health problems    . Substance abuse          The following portions of the patient's history were reviewed and updated as appropriate: allergies, current medications, past family history, past medical history, past social history, past surgical history and problem list.    Review of Systems   Constitutional: Negative for chills, fatigue, fever and weight loss.   HENT: Positive for congestion, postnasal drip, rhinorrhea and voice change. Negative for ear pain, sneezing and sore throat.    Eyes: Negative for pain, redness and itching.   Respiratory: Positive for cough. Negative for hemoptysis, shortness of breath and wheezing.    Cardiovascular: Negative for chest pain and palpitations.   Gastrointestinal: Negative for abdominal pain, diarrhea, heartburn, nausea and vomiting.   Genitourinary: Negative for dysuria.   Musculoskeletal: Negative for arthralgias, joint swelling and myalgias.   Skin: Negative for rash.   Neurological: Negative for dizziness, numbness and headaches.   Psychiatric/Behavioral: Negative for confusion.         Objective:    BP 139/81   Pulse 60   Temp 98.4 F (36.9 C)   Resp 20   Ht 1.727 m (5\' 8" )    Wt 66.7 kg (147 lb)   BMI 22.35 kg/m     Physical Exam  Vitals signs and nursing note reviewed.   Constitutional:       Appearance: Normal appearance. She is well-developed. She is not ill-appearing or diaphoretic.   HENT:      Head: Normocephalic and atraumatic.      Right Ear: Tympanic membrane, ear canal and external ear normal. No tenderness. No middle ear effusion. No foreign body. Tympanic membrane is not injected, erythematous, retracted or bulging.      Left Ear: Tympanic membrane, ear canal and external ear normal. No tenderness.  No middle ear effusion. No foreign body. Tympanic membrane is not injected, erythematous, retracted or bulging.      Nose:      Right Sinus: No maxillary sinus tenderness or frontal sinus tenderness.      Left Sinus: No maxillary sinus tenderness or frontal sinus tenderness.      Mouth/Throat:      Pharynx: Uvula midline. No oropharyngeal exudate.      Tonsils: No tonsillar exudate. Swelling: 1+ on the right. 1+ on the left.   Eyes:      General:         Right eye: No discharge.         Left eye: No discharge.      Conjunctiva/sclera: Conjunctivae normal.      Right eye: No exudate.     Left eye: No exudate.  Neck:  Musculoskeletal: Neck supple.   Cardiovascular:      Rate and Rhythm: Normal rate and regular rhythm.      Heart sounds: No murmur. No friction rub. No gallop.    Pulmonary:      Effort: Pulmonary effort is normal. No accessory muscle usage or respiratory distress.      Breath sounds: Normal breath sounds. No wheezing, rhonchi or rales.   Chest:      Chest wall: No tenderness.   Lymphadenopathy:      Cervical: No cervical adenopathy.   Skin:     General: Skin is warm and dry.      Findings: No rash.   Neurological:      Mental Status: Mental status is at baseline.   Psychiatric:         Mood and Affect: Mood normal.         Thought Content: Thought content normal.           Assessment and Plan:       Tearia was seen today for cough.    Diagnoses and all orders  for this visit:    Viral URI with cough  -     predniSONE (DELTASONE) 10 MG tablet; Take 3 tablets (30 mg total) by mouth daily for 5 days    Benign exam at this time.  Will give wait-and-see prednisone if continued cough.    Discussed that the symptoms are most likely due to viral illness and that antibiotics will not help.  Discussed that a viral cough/bronchitis can last 3-4 weeks, but as long as you are not getting worse, or start with fever, you will get better without antibiotics.    -RTC if not improved or start with fever for further evaluation and possibly a chest xray.  -Take Tylenol or Ibuprofen every 4-6 hours as needed for pain or fever.  -Drink lots of water, monitor PO intake and urine output for hydration.  -Flonase will help with inflammation of the nose and post nasal drip.  -Use saline spray in the nose, or a humidifier to loosen discharge.  -Mucinex or Delsym for cough.  -Zyrtec or Claritin for allergy symptoms.    Pt agrees with plan      Tillman Abide, [place]  [facility]  10/29/2018  11:40 AM

## 2018-10-30 ENCOUNTER — Encounter: Admit: 2018-10-30 | Discharge: 2018-10-30 | Payer: PRIVATE HEALTH INSURANCE

## 2018-10-30 DIAGNOSIS — J324 Chronic pansinusitis: Principal | ICD-10-CM

## 2018-10-30 DIAGNOSIS — J310 Chronic rhinitis: ICD-10-CM

## 2018-11-01 ENCOUNTER — Ambulatory Visit: Admit: 2018-11-01 | Discharge: 2018-11-02 | Payer: MEDICAID

## 2018-11-01 ENCOUNTER — Ambulatory Visit: Admit: 2018-11-01 | Discharge: 2018-11-01 | Payer: PRIVATE HEALTH INSURANCE

## 2018-11-01 DIAGNOSIS — J45909 Unspecified asthma, uncomplicated: Principal | ICD-10-CM

## 2018-11-01 DIAGNOSIS — J329 Chronic sinusitis, unspecified: Principal | ICD-10-CM

## 2018-12-13 ENCOUNTER — Ambulatory Visit: Admit: 2018-12-13 | Discharge: 2018-12-13 | Payer: MEDICAID

## 2018-12-13 ENCOUNTER — Encounter: Admit: 2018-12-13 | Discharge: 2018-12-13 | Payer: PRIVATE HEALTH INSURANCE

## 2018-12-13 DIAGNOSIS — T7840XD Allergy, unspecified, subsequent encounter: Principal | ICD-10-CM

## 2018-12-18 LAB — MISC REFERENCE TEST

## 2018-12-23 ENCOUNTER — Encounter: Admit: 2018-12-23 | Discharge: 2018-12-23 | Payer: PRIVATE HEALTH INSURANCE

## 2018-12-25 NOTE — L&D Delivery Note (Signed)
[facility]    Delivery Summary Note      Name: Ellen Dillon   MRN: [id number]  DOB:  [birth year]  Admitted: 09/01/2019  5:16 PM       Pre-delivery diagnosis:      38 y.o. G4P0030 at 4556w1d gestation. 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    Post-delivery diagnosis:  Same + delivery of a viable infant.    Findings:           Delivery of viable term infant/s. Infant(s) suctioned. Segment of cord sample collected. Cord clamped x2 and cut. Infant to maternal abdomen.  Cord blood collected.  Placenta delivered. Good hemostasis noted. See below for delivery details.     Disposition:  Infant(s) admitted to newborn nursery.    Laural BenesJohnson, Girl [[id number]][[id number]]    Labor Events    Preterm labor?:  No  Antenatal steroids:  None  Cervical Ripening:  09/01/2019 1843  Cervical ripening type:  Misoprostol  Antibiotics received during labor?:  No  Rupture date/time:  09/02/2019 0900  Rupture type:  Spontaneous  Fluid color:  Clear  Augmentation:  Oxytocin  Augmentation date/time:  09/02/2019 0925  Indications for augmentation:  Ineffective Contraction Pattern          Labor Event Times    Labor onset Date/Time:  09/02/2019 0900   Dilation complete Date/Time:  09/02/2019 1718     Delivery Anesthesia    Method:  Epidural     Assisted Delivery Details    Forceps attempted?:  No  Vacuum extractor attempted?:  No     Shoulder Dystocia Maneuvers    Shoulder dystocia present?:  No     Lacerations    Episiotomy:  None   Perineal lacerations:  2nd    Vaginal delivery est. blood loss (mL):  250  Repair suture:  3-0 Vicryl  Number of repair packets:  1     Delivery Information    Birth date/time:  09/02/2019 1745  Sex:  Female  Delivery type:  Vaginal, Spontaneous     Newborn Presentation    Presentation:  Vertex  Position:  Right Occiput Anterior     Cord Information    Vessels:  3 Vessels  Complications:  None  Cord blood  disposition:  Cord Segment Sent, Lab  Gases sent?:  No  Stem cell collection (by MD):  No     Resuscitation    Method:  None     Newborn  Apgars    Living status:  Living      Skin color:     Heart rate:     Reflex Irrit:     Muscle tone:     Resp. effort:     Total:      1 Min:     1    2    2    1    2    8     5  Min:     1    2    2    2    2    9     10  Min:      15 Min:      20 Min:        Apgars assigned by:  DR Jimmey RalphPARKER     Skin to Skin    Skin to skin initiation:  09/02/2019 1745  Skin to skin with:  Mother  Skin to skin end:  09/02/2019 1920  Breastfeeding initiated:  09/02/2019 1915     Newborn Measurements    Weight:  3109 g  Length:  48.3 cm  Head circumference:  33 cm  Chest circumference:  30.5 cm  Abdominal girth:  30.5 cm     Placenta    Date/time:  09/02/2019 1755  Removal:  Spontaneous  Appearance:  Intact  Disposition:  Held for future pathology     Other Delivery Procedures    Procedures:  None     Delivery Providers    Delivering Clinician:  Cordelia Poche, MD   Provider Role   D'Autrechy, Tiffany, RN Registered Nurse   Coralee Pesa, RN Baby Nurse   Evonnie Dawes, DO Physician   Darrel Hoover, MD OB/GYN Physician   Jean Rosenthal, RT Respiratory Therapist   Consuelo Pandy, MD Resident   Argie Ramming, MD Physician               Labor Event Times    Labor onset date/time:  09/02/2019 0900  Dilation complete date/time:  09/02/2019 1718     Labor Length    1st stage:  8h 7m   2nd stage:  0h 82m   3rd stage:  0h 11m             Cordelia Poche, MD    I was present during the entire delivery. Discussed note and patient with resident. Agree with above note.

## 2018-12-31 ENCOUNTER — Encounter: Admit: 2018-12-31 | Discharge: 2018-12-31 | Payer: PRIVATE HEALTH INSURANCE

## 2019-01-30 ENCOUNTER — Other Ambulatory Visit: Payer: Medicaid Other | Attending: Obstetrics

## 2019-01-30 ENCOUNTER — Other Ambulatory Visit: Payer: Self-pay

## 2019-01-30 ENCOUNTER — Telehealth (INDEPENDENT_AMBULATORY_CARE_PROVIDER_SITE_OTHER): Payer: Self-pay | Admitting: Obstetrics

## 2019-01-30 ENCOUNTER — Ambulatory Visit (INDEPENDENT_AMBULATORY_CARE_PROVIDER_SITE_OTHER): Payer: Medicaid Other | Admitting: Obstetrics

## 2019-01-30 ENCOUNTER — Encounter (INDEPENDENT_AMBULATORY_CARE_PROVIDER_SITE_OTHER): Payer: Self-pay | Admitting: Obstetrics

## 2019-01-30 VITALS — BP 124/76 | HR 70 | Temp 98.4°F | Resp 19 | Ht 68.5 in | Wt 156.2 lb

## 2019-01-30 DIAGNOSIS — F17210 Nicotine dependence, cigarettes, uncomplicated: Secondary | ICD-10-CM

## 2019-01-30 DIAGNOSIS — O09293 Supervision of pregnancy with other poor reproductive or obstetric history, third trimester: Secondary | ICD-10-CM

## 2019-01-30 DIAGNOSIS — O09291 Supervision of pregnancy with other poor reproductive or obstetric history, first trimester: Secondary | ICD-10-CM

## 2019-01-30 DIAGNOSIS — Z3490 Encounter for supervision of normal pregnancy, unspecified, unspecified trimester: Secondary | ICD-10-CM

## 2019-01-30 DIAGNOSIS — O99321 Drug use complicating pregnancy, first trimester: Secondary | ICD-10-CM | POA: Insufficient documentation

## 2019-01-30 DIAGNOSIS — Z6823 Body mass index (BMI) 23.0-23.9, adult: Secondary | ICD-10-CM

## 2019-01-30 DIAGNOSIS — O99323 Drug use complicating pregnancy, third trimester: Secondary | ICD-10-CM

## 2019-01-30 LAB — UDS WITH CONFIRMATION, URINE
AMPHETAMINES URINE: NEGATIVE
BARBITURATES URINE: NEGATIVE
BENZODIAZEPINES URINE: NEGATIVE
CANNABINOIDS URINE: POSITIVE — AB
COCAINE METABOLITES URINE: POSITIVE — AB
METHADONE URINE: NEGATIVE
OPIATES URINE: NEGATIVE
OXYCODONE URINE: NEGATIVE
PCP URINE: NEGATIVE
TRICYCLIC ANTIDEPRESSANTS URINE: NEGATIVE

## 2019-01-30 NOTE — Nursing Note (Signed)
01/30/19 1000   Depression Screen   Little interest or pleasure in doing things. 0   Feeling down, depressed, or hopeless 0   PHQ 2 Total 0

## 2019-01-30 NOTE — Telephone Encounter (Signed)
Pt called inquiring about having [place] order placed--Please advise.    [place], [place]  01/30/2019, 14:27

## 2019-01-30 NOTE — Addendum Note (Signed)
Addended by: Paulo Fruit on: 01/30/2019 11:10 AM     Modules accepted: Orders

## 2019-01-30 NOTE — Nursing Note (Signed)
Chief Complaint:   Chief Complaint            Initial Prenatal Visit         Functional Health Screen        Vital Signs  BP 124/76   Pulse 70   Temp 36.9 C (98.4 F) (Tympanic)   Resp 19   Ht 1.74 m (5' 8.5")   Wt 70.9 kg (156 lb 3.2 oz)   BMI 23.40 kg/m       Social History     Tobacco Use   Smoking Status Current Every Day Smoker   . Packs/day: 1.00   . Years: 14.00   . Pack years: 14.00   . Types: Cigarettes   Smokeless Tobacco Never Used     Allergies  Allergies   Allergen Reactions   . Penicillins      HIVES   . Propoxyphene      HIVES   . Sulfa (Sulfonamides)      HIVES   . Tramadol Swelling     Medication History  Reviewed for OTC medication and any new medications, provider will review medication history  Care Team  Patient Care Team:  DiddenOnalee Hua, MD as PCP - General (EXTERNAL)  Immunizations - last 24 hours     None        Velia Meyer, LPN  12/25/7354, 70:14

## 2019-01-30 NOTE — Progress Notes (Signed)
Had previous miscarriage with D&C and is concerned she may miscarry again. TVsono shows IUP with round sac without subchorionic hemorrhages and a suggestion of a tiny yolk sac. No vag loss/bleeding.  Reassured and set for formal sono in a week and RTO following day for discussion with Dr. Dorita Sciara. UDS today positive for Cocaine and MJ. Discussed her drug use, anxiety, smoking and encouraged to reduce slowly. Will get confirmation of viable IUP and then do OB workup.

## 2019-01-30 NOTE — Nursing Note (Signed)
01/30/19 1100   SUBSTANCE TESTED    THC - Cut off Levels 50 ng/ml Positive   COC - Cut off Levels 150 ng/ml Positive   OPI - Cut off Levels 300 ng/ml Negative   MET - Cut off Levels 500 ng/ml Negative   AMP - Cut off Levels 500 ng/ml Negative   BZO - Cut off level 300 ng/ml Negative   BAR - Cut off Level 300 ng/ml Negative   MTD - Cut off Levels 300 ng/ml Negative   BUPG - Cut off Levels 10 ng/ml Negative   TCA - Cut off Levels 1000 ng/ml Negative   MDMA - Cut off levels 500 ng/ml Negative   OXY - Cut off Levels 100 ng/ml Negative   PCP - Cut off Levels 25 ng/ml Negative   PPX - Cut off Levels 300 ng/ml Negative   Date Administered 01/30/19   Tester TAT   Lot # [id number]   Exp Date 02/22/20   Quality Control Yes

## 2019-01-30 NOTE — Addendum Note (Signed)
Addended by: Paulo Fruit on: 01/30/2019 11:04 AM     Modules accepted: Orders

## 2019-01-30 NOTE — Procedures (Signed)
TV sono shows intrauterine pregnancy that is round and no subchorionic hemorrhages and there is suggestion of a tiny yolk sac but no identifiable fetal pole.

## 2019-01-31 NOTE — Telephone Encounter (Signed)
Order Pended. Paulo Fruit, MA  01/31/2019, 15:18

## 2019-02-01 LAB — COCAINE AND METABOLITE CONFIRMATION, URINE
BENZOYLECGONINE-BY GC/MS: 2455 ng/mL
COCAINE-BY GC/MS: NEGATIVE ng/mL
INTERPRETATION: POSITIVE

## 2019-02-03 LAB — CARBOXY - TETRAHYDROCANNABINOL (THC) CONFIRMATION, URINE
INTERPRETATION: POSITIVE
THC CARBOXYLIC ACID-BY GC/MS: 25 ng/mL

## 2019-02-04 ENCOUNTER — Other Ambulatory Visit: Payer: Self-pay

## 2019-02-04 ENCOUNTER — Ambulatory Visit
Admission: RE | Admit: 2019-02-04 | Discharge: 2019-02-04 | Disposition: A | Discharge status: Home / Self Care | Payer: Medicaid Other | Source: Ambulatory Visit | Attending: Obstetrics | Admitting: Obstetrics

## 2019-02-04 ENCOUNTER — Other Ambulatory Visit (INDEPENDENT_AMBULATORY_CARE_PROVIDER_SITE_OTHER): Payer: Self-pay | Admitting: Obstetrics

## 2019-02-04 DIAGNOSIS — Z3A08 8 weeks gestation of pregnancy: Secondary | ICD-10-CM | POA: Insufficient documentation

## 2019-02-04 DIAGNOSIS — Z3490 Encounter for supervision of normal pregnancy, unspecified, unspecified trimester: Secondary | ICD-10-CM

## 2019-02-04 DIAGNOSIS — Z3491 Encounter for supervision of normal pregnancy, unspecified, first trimester: Secondary | ICD-10-CM | POA: Insufficient documentation

## 2019-02-07 ENCOUNTER — Encounter (INDEPENDENT_AMBULATORY_CARE_PROVIDER_SITE_OTHER): Payer: Self-pay | Admitting: Obstetrics

## 2019-02-10 ENCOUNTER — Ambulatory Visit (INDEPENDENT_AMBULATORY_CARE_PROVIDER_SITE_OTHER): Payer: Medicaid Other | Admitting: OBSTETRICS/GYNECOLOGY

## 2019-02-10 ENCOUNTER — Other Ambulatory Visit: Payer: Medicaid Other | Attending: OBSTETRICS/GYNECOLOGY

## 2019-02-10 ENCOUNTER — Other Ambulatory Visit: Payer: Self-pay

## 2019-02-10 ENCOUNTER — Other Ambulatory Visit: Payer: Self-pay | Admitting: OBSTETRICS/GYNECOLOGY

## 2019-02-10 ENCOUNTER — Encounter (INDEPENDENT_AMBULATORY_CARE_PROVIDER_SITE_OTHER): Payer: Self-pay | Admitting: OBSTETRICS/GYNECOLOGY

## 2019-02-10 VITALS — BP 136/70 | HR 74 | Temp 98.2°F | Resp 18 | Ht 68.5 in | Wt 156.4 lb

## 2019-02-10 DIAGNOSIS — O09529 Supervision of elderly multigravida, unspecified trimester: Secondary | ICD-10-CM

## 2019-02-10 DIAGNOSIS — Z3A09 9 weeks gestation of pregnancy: Secondary | ICD-10-CM | POA: Insufficient documentation

## 2019-02-10 DIAGNOSIS — F1120 Opioid dependence, uncomplicated: Secondary | ICD-10-CM

## 2019-02-10 DIAGNOSIS — F1129 Opioid dependence with unspecified opioid-induced disorder: Secondary | ICD-10-CM

## 2019-02-10 DIAGNOSIS — F17210 Nicotine dependence, cigarettes, uncomplicated: Secondary | ICD-10-CM

## 2019-02-10 DIAGNOSIS — O99331 Smoking (tobacco) complicating pregnancy, first trimester: Secondary | ICD-10-CM

## 2019-02-10 DIAGNOSIS — I10 Essential (primary) hypertension: Secondary | ICD-10-CM

## 2019-02-10 DIAGNOSIS — O99321 Drug use complicating pregnancy, first trimester: Secondary | ICD-10-CM

## 2019-02-10 DIAGNOSIS — F1210 Cannabis abuse, uncomplicated: Secondary | ICD-10-CM

## 2019-02-10 DIAGNOSIS — O10011 Pre-existing essential hypertension complicating pregnancy, first trimester: Secondary | ICD-10-CM

## 2019-02-10 DIAGNOSIS — F17200 Nicotine dependence, unspecified, uncomplicated: Secondary | ICD-10-CM

## 2019-02-10 DIAGNOSIS — O09521 Supervision of elderly multigravida, first trimester: Secondary | ICD-10-CM

## 2019-02-10 NOTE — Nursing Note (Signed)
02/10/19 1500   OTHER   Protein Negative   Glucose Negative   Ketones Negative   Bottle Number   ([employer] Multistix 10 SG) [id number]   Lot# [id number]   Expiration Date 04/23/20   Waived Testing no   Performed Status Automated   UA Comments NONE   Initials (nurse) KSC   Vitals   Weight 70.9 kg (156 lb 6.4 oz)   BP (Non-Invasive) 136/70

## 2019-02-10 NOTE — Nursing Note (Signed)
Chief Complaint:   Chief Complaint            Routine Prenatal Visit         Functional Health Screen        Vital Signs  BP 136/70   Pulse 74   Temp 36.8 C (98.2 F) (Tympanic)   Resp 18   Ht 1.74 m (5' 8.5")   Wt 70.9 kg (156 lb 6.4 oz)   BMI 23.43 kg/m       Social History     Tobacco Use   Smoking Status Current Every Day Smoker   . Packs/day: 1.00   . Years: 14.00   . Pack years: 14.00   . Types: Cigarettes   Smokeless Tobacco Never Used     Allergies  Allergies   Allergen Reactions   . Penicillins      HIVES   . Propoxyphene      HIVES   . Sulfa (Sulfonamides)      HIVES   . Tramadol Swelling     Medication History  Reviewed for OTC medication and any new medications, provider will review medication history  Care Team  Patient Care Team:  DiddenOnalee Hua, MD as PCP - General (EXTERNAL)  Immunizations - last 24 hours     None        Velia Meyer, LPN  1/69/4503, 15:20

## 2019-02-10 NOTE — Progress Notes (Signed)
[facility]  Initial Prenatal Encounter    ID: Ellen Dillon is a 38 y.o. G4P0030    DOB: 01-29-1981   Date of service: 02/10/2019     EDD: Estimated Date of Delivery: 09/15/19     SUBJECTIVE:  Pt was scheduled to see me to f/u sono and labs. She was seen 11 days ago by Dr Orson Slick and a viable IUP could not be determined at that time. A uds was done last visit which was pos for cocaine and thc. She has been on Subutex 8 mg bid seeing a physician in [place] x 5 years. She sees her doc q 2 weeks and weekly receives therapy. She denies h/o heroin use, had been addicted to "pills" and has been compliant in the program other than the cocaine use. She admits that she used cocaine and THC 1 week prior to her last visit on 2/6. She tells me that she used the cocaine prior to knowing that she was going to keep the pregnancy. She voiced awareness of the risk of cocaine use in pregnancy and has not used it any more since she has decided that she will continue with this pregnancy. Her relationship with FOB is still fairly new.     Denies FHX of aneuploidy in her or FOB's side of family.    Nausea or vomiting: some  Vaginal bleeding or discharge: no  Fetal movement: no  Swelling/edema: no      Medications:  Current Outpatient Medications   Medication Sig   . buprenorphine-naloxone (SUBOXONE) 8-2 mg Sublingual Tablet, Sublingual 1.5 Tabs by Sublingual route Once a day    . prenatal vitamin-iron-folate Tablet Take 1 Tab by mouth Once a day        Social History:  As above    Genetic Screening, past medical history, and past family history: Reviewed and chart updated.    OBJECTIVE:  BP 136/70   Pulse 74   Temp 36.8 C (98.2 F) (Tympanic)   Resp 18   Ht 1.74 m (5' 8.5")   Wt 70.9 kg (156 lb 6.4 oz)   LMP 12/09/2018   BMI 23.43 kg/m          UPT: positive     OB exam:  FHTs: 160      See prenatal Physical  Cervical Exam: Normal exam, pap done somewhat blindly, pt was unable to relax her pelvis for the speculum to  elevate her posterior cervix. No bleeding noted    Prenatal Labs: Ordered today    ASSESSMENT and PLAN:  38 y.o. G4P0030 with Estimated Date of Delivery: 09/15/19 here for routine prenatal care at [redacted]w[redacted]d WGA. Desired pregnancy aware of risk of illicit drug use  1. [redacted] weeks gestation of pregnancy (Primary)  -     POCT URINE DIPSTICK  -     HEPATITIS B SURFACE ANTIGEN; Future  -     HIV 1,2 - [facility] ONLY; Future  -     SYPHILIS TP ANTIBODIES; Future  -     RUBELLA VIRUS IGG AB; Future  -     TYPE AND SCREEN; Future  -     URINE CULTURE; Future  -     HEPATITIS C ANTIBODY SCREEN WITH REFLEX TO HCV PCR; Future  -     CBC/DIFF; Future  -     COMPREHENSIVE METABOLIC PROFILE - [facility] ONLY; Future  -     URIC ACID; Future  -     LDH; Future  -  PROTEIN, TOTAL URINE, TIMED; Future  -     CREATININE CLEARANCE PANEL; Future  -     PROTEIN, TOTAL URINE, TIMED; Future  -     CYTOPATHOLOGY-GYN (PAP AND HPV TESTS); Future  -     HPV, HIGH RISK REFLEX TO GENOTYPE - [facility] ONLY; Future  -     CHLAMYDIA / N. GONORRHEA RNA; Future    2. Antepartum multigravida of advanced maternal age  Referral to MFM offered and pt accepted    3. Drug use affecting pregnancy in first trimester  Pt aware of risks. I have reviewed with her both the clinical and social risks of illicit drug use. She will no longer do cocaine she tells me    4. Essential hypertension  No meds. Baseline preeclampsia labs done. 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 Current every day smoker  Smoking cessation encouraged      -Prenatal labs ordered  -Pap done today. G/C ordered  -Counseling:   -Substance use (EtOH, tobacco, illicit drugs)   -Labs/Screening   -Exercise/appropriate physical activity   -Safe medications   -Warning signs (cramping,  bleeding)   -Nausea/vomiting, GERD, constipation   -Nutrition and appropriate weight gain for BMI  -RTC 4 weeks    Abran Duke, MD  02/10/2019, 15:50

## 2019-02-11 ENCOUNTER — Telehealth (INDEPENDENT_AMBULATORY_CARE_PROVIDER_SITE_OTHER): Payer: Self-pay | Admitting: OBSTETRICS/GYNECOLOGY

## 2019-02-11 ENCOUNTER — Other Ambulatory Visit (INDEPENDENT_AMBULATORY_CARE_PROVIDER_SITE_OTHER): Payer: Self-pay | Admitting: OBSTETRICS/GYNECOLOGY

## 2019-02-11 DIAGNOSIS — N390 Urinary tract infection, site not specified: Secondary | ICD-10-CM

## 2019-02-11 MED ORDER — NITROFURANTOIN MONOHYDRATE/MACROCRYSTALS 100 MG CAPSULE: 100 mg | Cap | Freq: Two times a day (BID) | ORAL | 0 refills | 0 days | Status: DC

## 2019-02-11 NOTE — Nursing Note (Signed)
Called patient regarding prescription sent in per Dr. Dorita Sciara. No answer. LVM.    Velia Meyer, LPN  04/02/8118, 17:12

## 2019-02-11 NOTE — Nursing Note (Signed)
Called patient's pharmacy to confirm prescription for Subutex and last patient pick up. Prescription confirmed.    Velia Meyer, LPN  2/33/4356, 10:52

## 2019-02-12 ENCOUNTER — Other Ambulatory Visit: Payer: Self-pay

## 2019-02-12 ENCOUNTER — Emergency Department
Admission: EM | Admit: 2019-02-12 | Discharge: 2019-02-12 | Disposition: A | Discharge status: Home / Self Care | Payer: Medicaid Other | Attending: Emergency Medicine | Admitting: Emergency Medicine

## 2019-02-12 ENCOUNTER — Emergency Department (EMERGENCY_DEPARTMENT_HOSPITAL): Payer: Medicaid Other

## 2019-02-12 DIAGNOSIS — O99321 Drug use complicating pregnancy, first trimester: Secondary | ICD-10-CM | POA: Insufficient documentation

## 2019-02-12 DIAGNOSIS — O2341 Unspecified infection of urinary tract in pregnancy, first trimester: Secondary | ICD-10-CM | POA: Insufficient documentation

## 2019-02-12 DIAGNOSIS — N390 Urinary tract infection, site not specified: Secondary | ICD-10-CM

## 2019-02-12 DIAGNOSIS — O200 Threatened abortion: Principal | ICD-10-CM | POA: Insufficient documentation

## 2019-02-12 DIAGNOSIS — O26851 Spotting complicating pregnancy, first trimester: Secondary | ICD-10-CM

## 2019-02-12 DIAGNOSIS — Z3A08 8 weeks gestation of pregnancy: Secondary | ICD-10-CM | POA: Insufficient documentation

## 2019-02-12 DIAGNOSIS — F1110 Opioid abuse, uncomplicated: Secondary | ICD-10-CM | POA: Insufficient documentation

## 2019-02-12 DIAGNOSIS — O161 Unspecified maternal hypertension, first trimester: Secondary | ICD-10-CM | POA: Insufficient documentation

## 2019-02-12 DIAGNOSIS — F17210 Nicotine dependence, cigarettes, uncomplicated: Secondary | ICD-10-CM | POA: Insufficient documentation

## 2019-02-12 DIAGNOSIS — Z79899 Other long term (current) drug therapy: Secondary | ICD-10-CM | POA: Insufficient documentation

## 2019-02-12 DIAGNOSIS — Z369 Encounter for antenatal screening, unspecified: Secondary | ICD-10-CM | POA: Insufficient documentation

## 2019-02-12 DIAGNOSIS — O99331 Smoking (tobacco) complicating pregnancy, first trimester: Secondary | ICD-10-CM | POA: Insufficient documentation

## 2019-02-12 DIAGNOSIS — Z79891 Long term (current) use of opiate analgesic: Secondary | ICD-10-CM | POA: Insufficient documentation

## 2019-02-12 LAB — CBC WITH DIFF
BASOPHIL #: 0.1 x10ˆ3/uL (ref 0.00–0.10)
BASOPHIL %: 1 % (ref 0–3)
BASOPHIL %: 1 % (ref 0–3)
EOSINOPHIL #: 0.1 x10ˆ3/uL (ref 0.00–0.50)
EOSINOPHIL %: 1 % (ref 0–5)
HCT: 38.5 % (ref 36.0–45.0)
HGB: 12.2 g/dL (ref 12.0–15.5)
LYMPHOCYTE #: 1.9 x10ˆ3/uL (ref 1.00–4.80)
LYMPHOCYTE %: 25 % (ref 15–43)
MCH: 22.2 pg — ABNORMAL LOW (ref 27.5–33.2)
MCHC: 31.6 g/dL — ABNORMAL LOW (ref 32.0–36.0)
MCV: 70.1 fL — ABNORMAL LOW (ref 82.0–97.0)
MONOCYTE #: 0.6 x10ˆ3/uL (ref 0.20–0.90)
MONOCYTE %: 8 % (ref 5–12)
MPV: 8.4 fL (ref 7.4–10.5)
NEUTROPHIL #: 5 x10ˆ3/uL (ref 1.50–6.50)
NEUTROPHIL %: 65 % (ref 43–76)
PLATELETS: 262 x10ˆ3/uL (ref 150–450)
RBC: 5.49 10*6/uL — ABNORMAL HIGH (ref 4.00–5.10)
RDW: 14.7 % (ref 11.0–16.0)
WBC: 7.6 10*3/uL (ref 4.0–11.0)
WBC: 7.6 10*3/uL (ref 4.0–11.0)

## 2019-02-12 LAB — URINALYSIS, MICROSCOPIC: SQUAMOUS EPITHELIAL: >30 /hpf — AB

## 2019-02-12 LAB — SCAN DIFFERENTIAL
PLATELET ESTIMATE: ADEQUATE
WBC MORPHOLOGY COMMENT: NORMAL

## 2019-02-12 LAB — CHLAMYDIA TRACHOMATIS/NEISSERIA GONORRHOEAE RNA, NAAT
CHLAMYDIA TRACHOMATIS RNA: NEGATIVE
NEISSERIA GONORRHEA GC RNA: NEGATIVE

## 2019-02-12 LAB — URINALYSIS, MACROSCOPIC
BILIRUBIN: NEGATIVE mg/dL
GLUCOSE: NORMAL mg/dL
KETONES: NEGATIVE mg/dL
NITRITE: POSITIVE — AB
PH: 7 (ref 5.0–7.5)
PROTEIN: NEGATIVE mg/dL
SPECIFIC GRAVITY: 1.015 (ref 1.005–1.020)
UROBILINOGEN: <1 mg/dL (ref <=2.0)

## 2019-02-12 LAB — HCG, PLASMA OR SERUM QUANTITATIVE, PREGNANCY: HCG QUANTITATIVE PREGNANCY: 126105 IU/L — ABNORMAL HIGH (ref <5)

## 2019-02-12 LAB — URINE CULTURE: URINE CULTURE: 100000 — AB

## 2019-02-12 MED ORDER — SODIUM CHLORIDE 0.9 % IV BOLUS
1000.0000 mL | INJECTION | Freq: Once | Status: AC
Start: 2019-02-12 — End: 2019-02-12
  Administered 2019-02-12: 0 mL via INTRAVENOUS
  Administered 2019-02-12: 1000 mL via INTRAVENOUS

## 2019-02-12 MED ORDER — NITROFURANTOIN MONOHYDRATE/MACROCRYSTALS 100 MG CAPSULE: 100 mg | Cap | Freq: Two times a day (BID) | ORAL | 0 refills | 0 days | Status: AC

## 2019-02-12 MED ORDER — SODIUM CHLORIDE 0.9 % (FLUSH) INJECTION SYRINGE
2.50 mL | INJECTION | Freq: Three times a day (TID) | INTRAMUSCULAR | Status: DC
Start: 2019-02-12 — End: 2019-02-12

## 2019-02-12 NOTE — ED Nurses Note (Signed)
Patient discharged home with family.  AVS reviewed with patient/care giver.  A written copy of the AVS and discharge instructions was given to the patient/care giver.  Questions sufficiently answered as needed.  Patient/care giver encouraged to follow up with PCP as indicated.  In the event of an emergency, patient/care giver instructed to call 911 or go to the nearest emergency room.     Return to work for 02/20 provided to patient.  Patient verbalized understanding that Rx for Macrobid 100 mg PO has been called into [facility] for pick up at her convenience.

## 2019-02-12 NOTE — ED Nurses Note (Signed)
Called the office of Dr. Orson Slick and left voicemail

## 2019-02-12 NOTE — ED Provider Notes (Signed)
[facility]  Emergency Department     HISTORY OF PRESENT ILLNESS     Date:  02/12/2019  Patient's Name:  Ellen Dillon  Date of Birth:  [birth year]    HPI   38 y/o female presents to the ED complaining of vaginal bleeding while pregnant. Pt is G4P0 and reports that she is [redacted] weeks pregnant. This morning she had onset of vaginal bleeding that she notices when she wipes after urinating. Pt did not notice any blood in the toilet. She also complains of mild abdominal pain. Denies nausea or vomiting.     Review of Systems     Review of Systems   Constitutional: Negative for chills and fever.   Respiratory: Negative for cough and shortness of breath.    Cardiovascular: Negative for chest pain.   Gastrointestinal: Positive for abdominal pain. Negative for diarrhea, nausea and vomiting.   Genitourinary: Positive for vaginal bleeding.   Neurological: Negative for syncope and headaches.   All other systems reviewed and are negative.      Previous History     Past Medical History:  Past Medical History:   Diagnosis Date   . Anxiety    . Hypertension     due to pregnancy   . 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 Overweight(278.02)    . Panic attack    . Seizure (CMS [facility])     age 14        Past Surgical History:  Past Surgical History:   Procedure Laterality Date   . Hx breast biopsy     . Hx cyst incision and drainage  LEFT   . Hx dilation and curettage  01/04/12        Social History:  Social History     Tobacco Use   . Smoking status: Current Every Day Smoker     Packs/day: 0.50     Years: 14.00     Pack years: 7.00     Types: Cigarettes   . Smokeless tobacco: Never Used   Substance Use Topics   . Alcohol use: No   . Drug use: No     Types: Other     Comment: denies     Social History     Substance and Sexual Activity   Drug Use No   . Types: Other    Comment: denies       Family History:  Family History   Problem Relation Age of Onset   . Diabetes Maternal Grandmother    .  Diabetes Maternal Grandfather    . Healthy Father    . Healthy Mother    . Healthy Brother        Medication History:  Current Outpatient Medications   Medication Sig   . buprenorphine-naloxone (SUBOXONE) 8-2 mg Sublingual Tablet, Sublingual 1.5 Tabs by Sublingual route Once a day    . nitrofurantoin (MACROBID) 100 mg Oral Capsule Take 1 Cap (100 mg total) by mouth Twice daily for 7 days   . prenatal vitamin-iron-folate Tablet Take 1 Tab by mouth Once a day       Allergies:  Allergies   Allergen Reactions   . Penicillins      HIVES   . Propoxyphene      HIVES   . Sulfa (Sulfonamides)      HIVES   . Tramadol Swelling       Physical Exam     Vitals:  BP 124/71   Pulse 73   Temp 36.7 C (98.1 F)   Resp 16   Ht 1.727 m (5\' 8" )   Wt 71.7 kg (158 lb)   LMP 12/09/2018   SpO2 100%   BMI 24.02 kg/m         Physical Exam   Nursing note and vitals reviewed.  Constitutional:  Well developed, well nourished.  Awake & alert. No distress.  Head:  Atraumatic.  Normocephalic.    Eyes:  PERRL.  EOMI.  Conjunctivae are not pale.  ENT:  Mucous membranes are moist and intact.  Oropharynx is clear and symmetric.  Patent airway.  Neck:  Supple.  Full ROM.  No JVD.  No lymphadenopathy.  Cardiovascular:  Regular rate.  Regular rhythm.  No murmurs, rubs, or gallops.  Distal pulses are 2+ and symmetric.  Pulmonary/Chest:  No evidence of respiratory distress.  Clear to auscultation bilaterally.  No wheezing, rales or rhonchi. Chest non-tender.  Abdominal:  Soft and non-distended.  There is no tenderness.  No rebound, guarding, or rigidity.  No organomegaly.  Good bowel sounds.    Back:  No CVA tenderness. FROM.   Extremities:  No edema.   No cyanosis.  No clubbing.  Full range of motion in all extremities.  No calf tenderness.  Skin:  Skin is warm and dry.  No diaphoresis. No rash.   Neurological:  Alert, awake, and appropriate.  Normal speech.  Sensation normal. Motor strengths 5/5. CN II-XII intact.   Psychiatric:  Good eye  contact.  Normal interaction, affect, and behavior.      Diagnostic Studies/Treatment     Medications:  Medications   NS flush syringe (has no administration in time range)   NS bolus infusion 1,000 mL (0 mL Intravenous Stopped 02/12/19 1037)       Discharge Medication List as of 02/12/2019 11:18 AM      START taking these medications    Details   nitrofurantoin (MACROBID) 100 mg Oral Capsule Take 1 Cap (100 mg total) by mouth Twice daily for 7 days, Disp-14 Cap, R-0, E-Rx             Labs:    Results for orders placed or performed during the hospital encounter of 02/12/19   HCG, PLASMA OR SERUM QUANTITATIVE, PREGNANCY   Result Value Ref Range    HCG QUANTITATIVE PREGNANCY 126,105 (H) <5 IU/L   CBC WITH DIFF   Result Value Ref Range    WBC 7.6 4.0 - 11.0 x10^3/uL    RBC 5.49 (H) 4.00 - 5.10 x10^6/uL    HGB 12.2 12.0 - 15.5 g/dL    HCT 19.5 09.3 - 26.7 %    MCV 70.1 (L) 82.0 - 97.0 fL    MCH 22.2 (L) 27.5 - 33.2 pg    MCHC 31.6 (L) 32.0 - 36.0 g/dL    RDW 12.4 58.0 - 99.8 %    PLATELETS 262 150 - 450 x10^3/uL    MPV 8.4 7.4 - 10.5 fL    NEUTROPHIL % 65 43 - 76 %    LYMPHOCYTE % 25 15 - 43 %    MONOCYTE % 8 5 - 12 %    EOSINOPHIL % 1 0 - 5 %    BASOPHIL % 1 0 - 3 %    NEUTROPHIL # 5.00 1.50 - 6.50 x10^3/uL    LYMPHOCYTE # 1.90 1.00 - 4.80 x10^3/uL    MONOCYTE # 0.60 0.20 - 0.90 x10^3/uL  EOSINOPHIL # 0.10 0.00 - 0.50 x10^3/uL    BASOPHIL # 0.10 0.00 - 0.10 x10^3/uL   URINALYSIS, MACROSCOPIC   Result Value Ref Range    COLOR Yellow Yellow    APPEARANCE Clear Clear    PH 7.0 5.0 - 7.5    LEUKOCYTES 1+ (A) Negative WBCs/uL    NITRITE Positive (A) Negative    PROTEIN Negative Negative mg/dL    GLUCOSE Normal Normal mg/dL    KETONES Negative Negative mg/dL    UROBILINOGEN < 1 <=3.2<=2.0 mg/dL    BILIRUBIN Negative Negative mg/dL    BLOOD 3+ (A) Negative mg/dL    SPECIFIC GRAVITY 4.4011.015 1.005 - 1.020   URINALYSIS, MICROSCOPIC   Result Value Ref Range    RBCS 0-2 0-2, None /hpf    WBCS 11-20 (A) 0-2, 3-5, None /hpf    BACTERIA 2+  (A) None /hpf    SQUAMOUS EPITHELIAL >30 (A) 0-2, None /hpf   SCAN DIFFERENTIAL   Result Value Ref Range    PLATELET ESTIMATE Adequate     HYPOCHROMASIA Moderate     MICROCYTOSIS Slight     WBC MORPHOLOGY COMMENT Normal        Radiology:  US OB FETAL EXAM W INITIAL GESTATION - [id number]    US OB FETAL EXAM W INITIAL GESTATION - [id number]   Final Result   Single intrauterine gestation at 8 weeks, 6 days.            Radiologist location ID: [id number]             ECG:  NONE      Procedure     Procedures    Course/Disposition/Plan     Course:    0913: Initial evaluation is complete at this time. I discussed with the patient that I would order blood work, urinalysis, ultrasound to further evaluate. I will reevaluate to check the patient's progress after treatment. Patient is agreeable with the treatment plan at this time.    1118: Attempted to contact Dr. Orson SlickBowman multiple sign without success. Will discharge patient at this time, advised her to follow up with Dr. Orson SlickBowman.  Patient O-positive blood type, no need for RhoGAM.  Will place on Macrobid for presumed UTI          Disposition:    Discharged    Condition at Disposition:   Stable      Follow up:   Ceasar MonsBowman, Geoffrey, MD  [address]  [place] [id number]  [phone]    In 1 day        Clinical Impression:     Encounter Diagnoses   Name Primary?   . Threatened miscarriage Yes   . UTI (urinary tract infection)        Future Appointments Scheduled in Epic:  Future Appointments   Date Time Provider Department Center   03/17/2019  4:15 PM Abran Dukeruden-Parham, Consuela, MD [facility]     SCRIBE ATTESTATION   This note is prepared by Avel SensorMargaret Chandler, acting as Scribe for Dr. Ramiro HarvestParikh    The scribe's documentation has been prepared under my direction and personally reviewed by me in its entirety.  I confirm that the note above accurately reflects all work, treatment, procedures, and medical decision making performed by me, Dr. Ramiro HarvestParikh

## 2019-02-12 NOTE — Discharge Instructions (Signed)
TAKE MACROBID AS DIRECTED  FOLLOW-UP YOUR OB 1-3 DAYS  RETURN TO ER FOR WORSENING OR CONCERNING SYMPTOM

## 2019-02-12 NOTE — ED Triage Notes (Signed)
Pt ambulatory to Er reporting that she is a G4P0 LMP 12/10/18 at approx 8 weeks. Pt reports that she noticed bright red/ pinkish d/c when wiping. Pt reports mild cramping. Pt is alert and awake in nad

## 2019-02-12 NOTE — ED Nurses Note (Signed)
Paged Dr. Bowman on [facility]

## 2019-02-12 NOTE — ED Nurses Note (Signed)
Called [facility], asked receptionist to have Dr. Orson Slick call us back

## 2019-02-13 LAB — HPV HIGH RISK REFLEX TO GENOTYPE - BMC/JMC ONLY: HPV RNA PCR: NEGATIVE

## 2019-02-13 NOTE — Progress Notes (Signed)
Patient prescribed Macrobid.

## 2019-02-14 LAB — URINE CULTURE,ROUTINE: URINE CULTURE: >100000 — AB

## 2019-02-19 LAB — HISTORICAL CYTOPATHOLOGY-GYN (PAP AND HPV TESTS)

## 2019-02-21 ENCOUNTER — Encounter (INDEPENDENT_AMBULATORY_CARE_PROVIDER_SITE_OTHER): Payer: Self-pay | Admitting: Obstetrics

## 2019-02-27 ENCOUNTER — Ambulatory Visit (INDEPENDENT_AMBULATORY_CARE_PROVIDER_SITE_OTHER): Payer: Medicaid Other | Admitting: Obstetrics

## 2019-02-27 ENCOUNTER — Other Ambulatory Visit: Payer: Self-pay

## 2019-02-27 ENCOUNTER — Ambulatory Visit: Payer: Medicaid Other | Attending: OBSTETRICS/GYNECOLOGY

## 2019-02-27 ENCOUNTER — Encounter (INDEPENDENT_AMBULATORY_CARE_PROVIDER_SITE_OTHER): Payer: Self-pay | Admitting: Obstetrics

## 2019-02-27 DIAGNOSIS — O99332 Smoking (tobacco) complicating pregnancy, second trimester: Secondary | ICD-10-CM

## 2019-02-27 DIAGNOSIS — O99333 Smoking (tobacco) complicating pregnancy, third trimester: Secondary | ICD-10-CM | POA: Insufficient documentation

## 2019-02-27 DIAGNOSIS — O09291 Supervision of pregnancy with other poor reproductive or obstetric history, first trimester: Secondary | ICD-10-CM

## 2019-02-27 DIAGNOSIS — O99341 Other mental disorders complicating pregnancy, first trimester: Secondary | ICD-10-CM

## 2019-02-27 DIAGNOSIS — O99321 Drug use complicating pregnancy, first trimester: Secondary | ICD-10-CM

## 2019-02-27 DIAGNOSIS — F17200 Nicotine dependence, unspecified, uncomplicated: Secondary | ICD-10-CM

## 2019-02-27 DIAGNOSIS — Z8759 Personal history of other complications of pregnancy, childbirth and the puerperium: Secondary | ICD-10-CM | POA: Insufficient documentation

## 2019-02-27 DIAGNOSIS — Z3A09 9 weeks gestation of pregnancy: Secondary | ICD-10-CM | POA: Insufficient documentation

## 2019-02-27 DIAGNOSIS — F419 Anxiety disorder, unspecified: Secondary | ICD-10-CM | POA: Insufficient documentation

## 2019-02-27 DIAGNOSIS — Z3A11 11 weeks gestation of pregnancy: Secondary | ICD-10-CM

## 2019-02-27 DIAGNOSIS — O99331 Smoking (tobacco) complicating pregnancy, first trimester: Secondary | ICD-10-CM

## 2019-02-27 DIAGNOSIS — O09293 Supervision of pregnancy with other poor reproductive or obstetric history, third trimester: Secondary | ICD-10-CM | POA: Insufficient documentation

## 2019-02-27 DIAGNOSIS — O99320 Drug use complicating pregnancy, unspecified trimester: Secondary | ICD-10-CM | POA: Insufficient documentation

## 2019-02-27 DIAGNOSIS — F1190 Opioid use, unspecified, uncomplicated: Principal | ICD-10-CM | POA: Insufficient documentation

## 2019-02-27 DIAGNOSIS — Z9889 Other specified postprocedural states: Secondary | ICD-10-CM | POA: Insufficient documentation

## 2019-02-27 DIAGNOSIS — O09292 Supervision of pregnancy with other poor reproductive or obstetric history, second trimester: Secondary | ICD-10-CM | POA: Insufficient documentation

## 2019-02-27 HISTORY — DX: Smoking (tobacco) complicating pregnancy, third trimester: O99.333

## 2019-02-27 LAB — CBC WITH DIFF
BASOPHIL #: 0.1 x10ˆ3/uL (ref 0.00–0.10)
BASOPHIL %: 1 % (ref 0–3)
EOSINOPHIL #: 0.1 x10ˆ3/uL (ref 0.00–0.50)
EOSINOPHIL %: 2 % (ref 0–5)
HCT: 35.3 % — ABNORMAL LOW (ref 36.0–45.0)
HGB: 11.4 g/dL — ABNORMAL LOW (ref 12.0–15.5)
LYMPHOCYTE #: 2.2 x10ˆ3/uL (ref 1.00–4.80)
LYMPHOCYTE %: 31 % (ref 15–43)
MCH: 22.7 pg — ABNORMAL LOW (ref 27.5–33.2)
MCHC: 32.4 g/dL (ref 32.0–36.0)
MCV: 70.2 fL — ABNORMAL LOW (ref 82.0–97.0)
MONOCYTE #: 0.5 x10ˆ3/uL (ref 0.20–0.90)
MONOCYTE %: 7 % (ref 5–12)
MPV: 8.6 fL (ref 7.4–10.5)
NEUTROPHIL #: 4.1 x10ˆ3/uL (ref 1.50–6.50)
NEUTROPHIL %: 59 % (ref 43–76)
PLATELETS: 225 x10ˆ3/uL (ref 150–450)
RBC: 5.03 x10ˆ6/uL (ref 4.00–5.10)
RDW: 14.2 % (ref 11.0–16.0)
WBC: 6.9 10*3/uL (ref 4.0–11.0)

## 2019-02-27 LAB — COMPREHENSIVE METABOLIC PROFILE - BMC/JMC ONLY
ALBUMIN/GLOBULIN RATIO: 1.5 (ref 0.8–2.0)
ALBUMIN: 4 g/dL (ref 3.5–5.0)
ALKALINE PHOSPHATASE: 52 U/L (ref 38–126)
ALT (SGPT): 15 U/L (ref 14–54)
ANION GAP: 6 mmol/L (ref 3–11)
AST (SGOT): 15 U/L (ref 15–41)
BILIRUBIN TOTAL: 0.3 mg/dL (ref 0.3–1.2)
BUN/CREA RATIO: 18 (ref 6–22)
BUN: 9 mg/dL (ref 6–20)
CALCIUM: 9.1 mg/dL (ref 8.6–10.3)
CHLORIDE: 103 mmol/L (ref 101–111)
CO2 TOTAL: 24 mmol/L (ref 22–32)
CREATININE: 0.49 mg/dL (ref 0.44–1.00)
ESTIMATED GFR: >60 mL/min/1.73mˆ2 (ref >60)
GLUCOSE: 88 mg/dL (ref 70–110)
POTASSIUM: 3.5 mmol/L (ref 3.4–5.1)
PROTEIN TOTAL: 6.7 g/dL (ref 6.4–8.3)
SODIUM: 133 mmol/L — ABNORMAL LOW (ref 136–145)

## 2019-02-27 LAB — HIV 1,2: HIV SCREEN, COMBINED ANTIGEN & ANTIBODY: NEGATIVE

## 2019-02-27 LAB — URIC ACID: URIC ACID: 3 mg/dL (ref 2.6–7.0)

## 2019-02-27 LAB — SYPHILIS TP ANTIBODIES: SYPHILIS TP ANTIBODIES QUALITATIVE: NONREACTIVE

## 2019-02-27 LAB — TYPE AND SCREEN
ABO/RH(D): O POS
ANTIBODY SCREEN: NEGATIVE

## 2019-02-27 LAB — SCAN DIFFERENTIAL
PLATELET ESTIMATE: ADEQUATE
WBC MORPHOLOGY COMMENT: NORMAL

## 2019-02-27 LAB — HEPATITIS B SURFACE ANTIGEN: HBV SURFACE ANTIGEN QUALITATIVE: NEGATIVE

## 2019-02-27 LAB — LDH: LDH: 100 U/L (ref 98–192)

## 2019-02-27 LAB — HEPATITIS C ANTIBODY SCREEN WITH REFLEX TO HCV PCR: HCV ANTIBODY QUALITATIVE: NEGATIVE

## 2019-02-27 NOTE — Progress Notes (Signed)
[facility]  [address]  [place] [address]       Name: Ellen Dillon MRN:  [id number]   Date: 02/27/2019 Age: 38 y.o. [birth year]          Subjective:   Ellen Dillon is being seen today for her obstetrical visit.  She is at [redacted]w[redacted]d weeks gestation.       Objective:   Weight: 76.6 kg (168 lb 12.8 oz)  BP (Non-Invasive): (!) 122/58  # of fetuses: 1  Protein: Negative  Glucose: Negative  Ketones: Negative            BP (!) 122/58 (Site: Right, Patient Position: Sitting, Cuff Size: Adult)   Pulse 56   Temp 36.6 C (97.9 F) (Tympanic)   Resp 18   Ht 1.74 m (5' 8.5")   Wt 76.6 kg (168 lb 12.8 oz)   LMP 12/09/2018   SpO2 97%   Breastfeeding No   BMI 25.29 kg/m       Lab Results   Component Value Date    HIV12 NONREACTIVE 12/25/2011    UCULT NO GROWTH AFTER 2 DAYS INCUBATION. 12/25/2011     Assessment:   Pregnancy [redacted]w[redacted]d weeks    ICD-10-CM    1. 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 Smoking (tobacco) complicating pregnancy, second trimester O99.332    3. History of pregnancy induced hypertension Z87.59    4. Anxiety F41.9    5. Hx of induced abortion Z98.890    6. Hx of spontaneous abortion, currently pregnant, second trimester O09.292        Plan:   Schedule Viability [place]. Has MFM appt for Mar 16. Encouraged to get labs and collect the 24hr UTP, had confirmed E. Coil urosepsis from ER and took all Macrobid.     Ceasar Mons, MD

## 2019-02-27 NOTE — Nursing Note (Signed)
Chief Complaint:   Chief Complaint            Routine Prenatal Visit     Urinary Tract Infection ED follow up        Functional Health Screen  Functional Health Screening:         BP (!) 122/58 (Site: Right, Patient Position: Sitting, Cuff Size: Adult)   Pulse 56   Temp 36.6 C (97.9 F) (Tympanic)   Resp 18   Ht 1.74 m (5' 8.5")   Wt 76.6 kg (168 lb 12.8 oz)   LMP 12/09/2018   SpO2 97%   Breastfeeding No   BMI 25.29 kg/m       Social History     Tobacco Use   Smoking Status Current Every Day Smoker   . Packs/day: 0.50   . Years: 14.00   . Pack years: 7.00   . Types: Cigarettes   Smokeless Tobacco Never Used     Patient Health Rating           Depression Screening  PHQ Questionnaire     Allergies:  Allergies   Allergen Reactions   . Penicillins      HIVES   . Propoxyphene      HIVES   . Sulfa (Sulfonamides)      HIVES   . Tramadol Swelling     Medication History  Reviewed for OTC medication and any new medications, provider will review medication history  Results through Enter/Edit  No results found for this or any previous visit (from the past 24 hour(s)).  POCT Results  Care Team  Patient Care Team:  Didden, Onalee Hua, MD as PCP - General (EXTERNAL)  Immunizations - last 24 hours     None        Paulo Fruit, [place]  02/27/2019, 11:19

## 2019-02-28 LAB — RUBELLA VIRUS IGG AB: RUBELLA IGG ANTIBODY: 39.1 [IU]/mL

## 2019-03-04 ENCOUNTER — Other Ambulatory Visit: Payer: Self-pay

## 2019-03-04 ENCOUNTER — Ambulatory Visit
Admission: RE | Admit: 2019-03-04 | Discharge: 2019-03-04 | Disposition: A | Discharge status: Home / Self Care | Payer: Medicaid Other | Source: Ambulatory Visit | Attending: Obstetrics | Admitting: Obstetrics

## 2019-03-04 DIAGNOSIS — O09292 Supervision of pregnancy with other poor reproductive or obstetric history, second trimester: Secondary | ICD-10-CM

## 2019-03-04 DIAGNOSIS — Z8759 Personal history of other complications of pregnancy, childbirth and the puerperium: Secondary | ICD-10-CM | POA: Insufficient documentation

## 2019-03-04 DIAGNOSIS — F419 Anxiety disorder, unspecified: Secondary | ICD-10-CM | POA: Insufficient documentation

## 2019-03-04 DIAGNOSIS — O09291 Supervision of pregnancy with other poor reproductive or obstetric history, first trimester: Secondary | ICD-10-CM | POA: Insufficient documentation

## 2019-03-04 DIAGNOSIS — F1190 Opioid use, unspecified, uncomplicated: Secondary | ICD-10-CM | POA: Insufficient documentation

## 2019-03-04 DIAGNOSIS — Z9889 Other specified postprocedural states: Secondary | ICD-10-CM

## 2019-03-04 DIAGNOSIS — O99331 Smoking (tobacco) complicating pregnancy, first trimester: Secondary | ICD-10-CM | POA: Insufficient documentation

## 2019-03-04 DIAGNOSIS — F17200 Nicotine dependence, unspecified, uncomplicated: Secondary | ICD-10-CM

## 2019-03-04 DIAGNOSIS — O99321 Drug use complicating pregnancy, first trimester: Secondary | ICD-10-CM | POA: Insufficient documentation

## 2019-03-04 DIAGNOSIS — O99341 Other mental disorders complicating pregnancy, first trimester: Secondary | ICD-10-CM | POA: Insufficient documentation

## 2019-03-04 DIAGNOSIS — O99332 Smoking (tobacco) complicating pregnancy, second trimester: Secondary | ICD-10-CM

## 2019-03-04 DIAGNOSIS — O99320 Drug use complicating pregnancy, unspecified trimester: Secondary | ICD-10-CM

## 2019-03-04 DIAGNOSIS — Z3A12 12 weeks gestation of pregnancy: Secondary | ICD-10-CM | POA: Insufficient documentation

## 2019-03-10 ENCOUNTER — Encounter: Payer: Self-pay | Admitting: Maternal & Fetal Medicine

## 2019-03-10 ENCOUNTER — Ambulatory Visit (INDEPENDENT_AMBULATORY_CARE_PROVIDER_SITE_OTHER)
Admission: RE | Admit: 2019-03-10 | Discharge: 2019-03-10 | Disposition: A | Discharge status: Home / Self Care | Payer: No Typology Code available for payment source | Source: Ambulatory Visit | Attending: Obstetrics & Gynecology | Admitting: Obstetrics & Gynecology

## 2019-03-10 ENCOUNTER — Telehealth (INDEPENDENT_AMBULATORY_CARE_PROVIDER_SITE_OTHER): Payer: Self-pay | Admitting: OBSTETRICS/GYNECOLOGY

## 2019-03-10 ENCOUNTER — Other Ambulatory Visit (INDEPENDENT_AMBULATORY_CARE_PROVIDER_SITE_OTHER): Payer: Self-pay | Admitting: OBSTETRICS/GYNECOLOGY

## 2019-03-10 DIAGNOSIS — O23599 Infection of other part of genital tract in pregnancy, unspecified trimester: Secondary | ICD-10-CM

## 2019-03-10 DIAGNOSIS — B9689 Other specified bacterial agents as the cause of diseases classified elsewhere: Principal | ICD-10-CM

## 2019-03-10 DIAGNOSIS — Z3490 Encounter for supervision of normal pregnancy, unspecified, unspecified trimester: Secondary | ICD-10-CM

## 2019-03-10 MED ORDER — METRONIDAZOLE 250 MG TABLET: 500 mg | Tab | Freq: Two times a day (BID) | ORAL | 0 refills | 0 days | Status: AC

## 2019-03-10 NOTE — Nursing Note (Signed)
Called patient and informed her of BV in pap and ABX sent to pharmacy. Patient will complete 24 hour urine test that is outstanding close to her next appointment. Patient also had MFM appointment today.    Velia Meyer, LPN  2/63/7858, 15:15

## 2019-03-11 ENCOUNTER — Other Ambulatory Visit
Admission: RE | Admit: 2019-03-11 | Discharge: 2019-03-11 | Disposition: A | Discharge status: Home / Self Care | Payer: No Typology Code available for payment source | Source: Ambulatory Visit | Attending: Maternal & Fetal Medicine | Admitting: Maternal & Fetal Medicine

## 2019-03-11 DIAGNOSIS — Z3490 Encounter for supervision of normal pregnancy, unspecified, unspecified trimester: Secondary | ICD-10-CM

## 2019-03-11 LAB — VH REFERENCE LAB TEST

## 2019-03-13 ENCOUNTER — Encounter (INDEPENDENT_AMBULATORY_CARE_PROVIDER_SITE_OTHER): Payer: Self-pay | Admitting: OBSTETRICS/GYNECOLOGY

## 2019-03-13 ENCOUNTER — Encounter (INDEPENDENT_AMBULATORY_CARE_PROVIDER_SITE_OTHER): Payer: Self-pay | Admitting: Obstetrics

## 2019-03-14 LAB — HEMOGLOBINOPATHY EVALUATION
Hemoglobin A2 Quantitative: 2.6 % (ref 1.8–3.5)
Hemoglobin A: 97.4 % (ref >96.0)
Hemoglobin F: 0 % (ref <2.0)

## 2019-03-16 LAB — SPINAL MUSCULAR ATROPHY CARRIER SCREEN
SMN1: >=3
SMN2: 1

## 2019-03-17 ENCOUNTER — Encounter (INDEPENDENT_AMBULATORY_CARE_PROVIDER_SITE_OTHER): Payer: Medicaid Other | Admitting: OBSTETRICS/GYNECOLOGY

## 2019-03-19 LAB — CYSTIC FIBROSIS SCREEN

## 2019-03-26 ENCOUNTER — Encounter (INDEPENDENT_AMBULATORY_CARE_PROVIDER_SITE_OTHER): Payer: Self-pay

## 2019-03-27 ENCOUNTER — Telehealth: Payer: Medicaid Other | Admitting: Obstetrics

## 2019-03-27 ENCOUNTER — Other Ambulatory Visit: Payer: Self-pay

## 2019-03-27 DIAGNOSIS — O99320 Drug use complicating pregnancy, unspecified trimester: Secondary | ICD-10-CM

## 2019-03-27 DIAGNOSIS — Z3A15 15 weeks gestation of pregnancy: Secondary | ICD-10-CM

## 2019-03-27 DIAGNOSIS — O99332 Smoking (tobacco) complicating pregnancy, second trimester: Secondary | ICD-10-CM

## 2019-03-27 DIAGNOSIS — F1190 Opioid use, unspecified, uncomplicated: Principal | ICD-10-CM

## 2019-03-27 DIAGNOSIS — Z9889 Other specified postprocedural states: Secondary | ICD-10-CM

## 2019-03-27 DIAGNOSIS — O09292 Supervision of pregnancy with other poor reproductive or obstetric history, second trimester: Secondary | ICD-10-CM

## 2019-03-27 DIAGNOSIS — Z8759 Personal history of other complications of pregnancy, childbirth and the puerperium: Secondary | ICD-10-CM

## 2019-03-27 DIAGNOSIS — O99322 Drug use complicating pregnancy, second trimester: Secondary | ICD-10-CM

## 2019-03-27 DIAGNOSIS — F17200 Nicotine dependence, unspecified, uncomplicated: Secondary | ICD-10-CM

## 2019-03-27 DIAGNOSIS — F1490 Cocaine use, unspecified, uncomplicated: Secondary | ICD-10-CM

## 2019-03-27 DIAGNOSIS — F1210 Cannabis abuse, uncomplicated: Secondary | ICD-10-CM

## 2019-03-27 NOTE — Progress Notes (Addendum)
[facility]  [address]  [place] [address]       Name: Ellen Dillon MRN:  [id number]   Date: 03/27/2019 Age: 38 y.o. 1981-02-14          Subjective:   Ellen Dillon is being seen today for her obstetrical visit.  She is at [redacted]w[redacted]d weeks gestation.       Objective:      LMP 12/09/2018       Lab Results   Component Value Date    SYPHILISQL Non-reactive 02/27/2019    HIV12 NONREACTIVE 12/25/2011    UCULT NO GROWTH AFTER 2 DAYS INCUBATION. 12/25/2011     Assessment:   Pregnancy [redacted]w[redacted]d weeks    ICD-10-CM    1. 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 Smoking (tobacco) complicating pregnancy, second trimester O99.332    3. History of pregnancy induced hypertension Z87.59    4. Hx of spontaneous abortion, currently pregnant, second trimester O09.292    5. Hx of induced abortion Z98.890        Plan:     See very thorough MFM report. 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     FAMILY MEDICINE, [facility]  [address]  [place] [address]    Telephone Visit    Name:  Ellen Dillon MRN: [id number]   Date:  03/27/2019 Age:   38 y.o.     The patient/family initiated a request for telephone service.  Verbal consent for this service was obtained from the patient/family.    Last office visit in this department: Visit date not found          ICD-10-CM    1. 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 Smoking (tobacco) complicating pregnancy, second trimester O99.332    3. History of pregnancy induced hypertension Z87.59    4. Hx of spontaneous abortion, currently pregnant, second trimester O09.292    5. Hx of induced abortion Z98.890        Total provider time spent with the patient on the phone: 15 minutes.    I  personally offered the service to the patient, and obtained verbal consent to provide this service.    Ceasar Mons, MD

## 2019-03-28 ENCOUNTER — Telehealth (INDEPENDENT_AMBULATORY_CARE_PROVIDER_SITE_OTHER): Payer: Self-pay | Admitting: Obstetrics

## 2019-03-28 NOTE — Telephone Encounter (Signed)
Called patient to supply appointment for follow up with Dr. Orson Slick. No answer. LVM.  Velia Meyer, LPN  03/29/3645, 80:32

## 2019-04-03 ENCOUNTER — Encounter: Admit: 2019-04-03 | Discharge: 2019-04-03 | Payer: PRIVATE HEALTH INSURANCE

## 2019-04-07 ENCOUNTER — Encounter: Admit: 2019-04-07 | Discharge: 2019-04-07 | Payer: PRIVATE HEALTH INSURANCE

## 2019-04-08 ENCOUNTER — Other Ambulatory Visit
Admission: RE | Admit: 2019-04-08 | Discharge: 2019-04-08 | Disposition: A | Discharge status: Home / Self Care | Payer: No Typology Code available for payment source | Source: Ambulatory Visit | Attending: Maternal & Fetal Medicine | Admitting: Maternal & Fetal Medicine

## 2019-04-08 DIAGNOSIS — Z3490 Encounter for supervision of normal pregnancy, unspecified, unspecified trimester: Secondary | ICD-10-CM

## 2019-04-10 ENCOUNTER — Ambulatory Visit: Admit: 2019-04-10 | Discharge: 2019-04-11 | Payer: MEDICAID

## 2019-04-10 ENCOUNTER — Encounter: Admit: 2019-04-10 | Discharge: 2019-04-10 | Payer: PRIVATE HEALTH INSURANCE

## 2019-04-10 DIAGNOSIS — G43909 Migraine, unspecified, not intractable, without status migrainosus: Principal | ICD-10-CM

## 2019-04-10 DIAGNOSIS — F419 Anxiety disorder, unspecified: ICD-10-CM

## 2019-04-10 DIAGNOSIS — H04123 Dry eye syndrome of bilateral lacrimal glands: Secondary | ICD-10-CM

## 2019-04-10 DIAGNOSIS — E039 Hypothyroidism, unspecified: ICD-10-CM

## 2019-04-10 DIAGNOSIS — I10 Essential (primary) hypertension: ICD-10-CM

## 2019-04-10 DIAGNOSIS — F329 Major depressive disorder, single episode, unspecified: ICD-10-CM

## 2019-04-10 DIAGNOSIS — E119 Type 2 diabetes mellitus without complications: ICD-10-CM

## 2019-04-10 DIAGNOSIS — R5382 Chronic fatigue, unspecified: Principal | ICD-10-CM

## 2019-04-10 DIAGNOSIS — I2510 Atherosclerotic heart disease of native coronary artery without angina pectoris: ICD-10-CM

## 2019-04-10 DIAGNOSIS — M1990 Unspecified osteoarthritis, unspecified site: ICD-10-CM

## 2019-04-10 DIAGNOSIS — J45909 Unspecified asthma, uncomplicated: ICD-10-CM

## 2019-04-10 DIAGNOSIS — E785 Hyperlipidemia, unspecified: ICD-10-CM

## 2019-04-10 DIAGNOSIS — K929 Disease of digestive system, unspecified: ICD-10-CM

## 2019-04-10 DIAGNOSIS — M549 Dorsalgia, unspecified: ICD-10-CM

## 2019-04-10 DIAGNOSIS — J349 Unspecified disorder of nose and nasal sinuses: ICD-10-CM

## 2019-04-10 NOTE — Progress Notes
Patient has read all 3 forms and gives verbal consent  Patient reports all vitals

## 2019-04-11 DIAGNOSIS — M2550 Pain in unspecified joint: ICD-10-CM

## 2019-04-11 DIAGNOSIS — N96 Recurrent pregnancy loss: Secondary | ICD-10-CM

## 2019-04-11 DIAGNOSIS — R682 Dry mouth, unspecified: ICD-10-CM

## 2019-04-11 DIAGNOSIS — Z840 Family history of diseases of the skin and subcutaneous tissue: ICD-10-CM

## 2019-04-11 LAB — ALPHA FETOPROTEIN, MATERNAL
Alpha-fetoprotein, MoM: 1.17
Alpha-fetoprotein, Serum: 48.4 ng/mL
Calculated Gestational Age: 17
Maternal Weight: 156 [lb_av]
Number of Fetuses:: 1
Risk for ONTD: <1:5000 {titer}

## 2019-04-14 NOTE — Progress Notes
I spoke via telemedicine regarding my joint pain issues.    SUBJECTIVE/INTERVAL HISTORY:    I conducted a telemedicine visit with Dorothy Todd to reestablish care with rheumatology today.    Patient would like to reestablish care for her joint pain.  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  Because of back pain she is not able to stand for long periods of time.  Her knees feel really really weak-she describes this feeling as needs a like a bag of chips and she can hear noises when she tries to bend her knees.    She feels like her fingers are gradually shifting in opposite directions.  Her hips, knees, hands all ache most of the time.      When she is at work she feels the pain the most.  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  She has tried muscle relaxers,  however these do not help much.  The time of the day that is the worst for her is the afternoon.  She describes the pain as a dull, sharp, aching, burning pain.  Her hip muscles muscles hurt as well most of the time.  She feels like the pain from her hips radiates into her groin.      She would grade the pain as a 9 out of 10 most of the time.    No history of any rashes in sunlight.  No history of sores.  No history of blood clots.  She has had 2 miscarriages-at 21 weeks and 15 weeks.  No history of color or consistency of urine change.  She does report leg swelling-she takes Lasix intermittently for this as prescribed by her cardiologist.  She does report dry eyes, dry mouth.  She feels like she is tired most of the time.    No history of Raynaud's phenomenon    Family history-she has family history of rheumatoid arthritis, lupus, leukemia    Past medical history-diabetes, high cholesterol, anxiety, depression    Past surgical history-sinus reconstruction surgery, endometrial ablation, D&C    Social history-patient drinks very occasionally, no history of smoking or drug use.

## 2019-04-15 ENCOUNTER — Encounter: Payer: Self-pay | Admitting: Maternal & Fetal Medicine

## 2019-04-15 ENCOUNTER — Ambulatory Visit
Admission: RE | Admit: 2019-04-15 | Discharge: 2019-04-15 | Disposition: A | Discharge status: Home / Self Care | Payer: No Typology Code available for payment source | Source: Ambulatory Visit | Attending: Obstetrics & Gynecology | Admitting: Obstetrics & Gynecology

## 2019-04-15 DIAGNOSIS — Z3490 Encounter for supervision of normal pregnancy, unspecified, unspecified trimester: Secondary | ICD-10-CM

## 2019-04-15 DIAGNOSIS — O09522 Supervision of elderly multigravida, second trimester: Secondary | ICD-10-CM | POA: Insufficient documentation

## 2019-04-15 DIAGNOSIS — Z141 Cystic fibrosis carrier: Secondary | ICD-10-CM | POA: Insufficient documentation

## 2019-04-15 DIAGNOSIS — O355XX0 Maternal care for (suspected) damage to fetus by drugs, not applicable or unspecified: Secondary | ICD-10-CM | POA: Insufficient documentation

## 2019-04-15 DIAGNOSIS — Z3A18 18 weeks gestation of pregnancy: Secondary | ICD-10-CM | POA: Insufficient documentation

## 2019-04-15 DIAGNOSIS — Z3686 Encounter for antenatal screening for cervical length: Secondary | ICD-10-CM | POA: Insufficient documentation

## 2019-04-15 DIAGNOSIS — O99320 Drug use complicating pregnancy, unspecified trimester: Secondary | ICD-10-CM | POA: Insufficient documentation

## 2019-04-28 ENCOUNTER — Telehealth: Payer: Medicaid Other | Admitting: Obstetrics

## 2019-04-28 ENCOUNTER — Other Ambulatory Visit: Payer: Self-pay

## 2019-04-28 DIAGNOSIS — F1190 Opioid use, unspecified, uncomplicated: Secondary | ICD-10-CM

## 2019-04-28 DIAGNOSIS — Z141 Cystic fibrosis carrier: Secondary | ICD-10-CM | POA: Insufficient documentation

## 2019-04-28 DIAGNOSIS — Z8759 Personal history of other complications of pregnancy, childbirth and the puerperium: Secondary | ICD-10-CM

## 2019-04-28 DIAGNOSIS — Z833 Family history of diabetes mellitus: Secondary | ICD-10-CM

## 2019-04-28 DIAGNOSIS — O99332 Smoking (tobacco) complicating pregnancy, second trimester: Secondary | ICD-10-CM

## 2019-04-28 DIAGNOSIS — F17200 Nicotine dependence, unspecified, uncomplicated: Secondary | ICD-10-CM

## 2019-04-28 DIAGNOSIS — Z3A20 20 weeks gestation of pregnancy: Secondary | ICD-10-CM

## 2019-04-28 DIAGNOSIS — Z9889 Other specified postprocedural states: Secondary | ICD-10-CM

## 2019-04-28 DIAGNOSIS — O99320 Drug use complicating pregnancy, unspecified trimester: Secondary | ICD-10-CM

## 2019-04-28 DIAGNOSIS — O99322 Drug use complicating pregnancy, second trimester: Secondary | ICD-10-CM

## 2019-04-28 NOTE — Progress Notes (Addendum)
FAMILY MEDICINE, [facility]  [address]  [place] [address]       Name: Zoeya Styers MRN:  [id number]   Date: 04/28/2019 Age: 38 y.o. 24-Aug-1981          Subjective:   Dellis Anes Hoffpauir is being seen today for her obstetrical visit.  She is at [redacted]w[redacted]d weeks gestation.       Objective:                LMP 12/09/2018       Lab Results   Component Value Date    SYPHILISQL Non-reactive 02/27/2019    HIV12 NONREACTIVE 12/25/2011    UCULT NO GROWTH AFTER 2 DAYS INCUBATION. 12/25/2011     Assessment:   Pregnancy [redacted]w[redacted]d weeks    ICD-10-CM    1. Family history of diabetes mellitus in mother Z77.3 POCT EAST HGB A1C (AMB)     GLUCOSE TOLERANCE TEST (GTT), 3 HOUR   2. Smoking (tobacco) complicating pregnancy, second trimester O99.332    3. 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 History of pregnancy induced hypertension Z87.59    5. Hx of induced abortion Z98.890    6. 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 See in office in a month and repeat US with MFM in 8 weeks. 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   Telephone Visit    The patient/family initiated a request for telephone service.  Verbal consent for this service was obtained from the patient/family.    Total provider time spent with the patient on the phone: 15 minutes.    I personally offered the service to the patient, and obtained verbal consent to provide this service.    Ceasar Mons, MD      Ceasar Mons, MD

## 2019-05-05 ENCOUNTER — Encounter (INDEPENDENT_AMBULATORY_CARE_PROVIDER_SITE_OTHER): Payer: No Typology Code available for payment source

## 2019-05-26 ENCOUNTER — Encounter (INDEPENDENT_AMBULATORY_CARE_PROVIDER_SITE_OTHER): Payer: Self-pay | Admitting: Obstetrics

## 2019-05-27 ENCOUNTER — Encounter: Admit: 2019-05-27 | Discharge: 2019-05-27

## 2019-05-27 DIAGNOSIS — O3680X0 Pregnancy with inconclusive fetal viability, not applicable or unspecified: Secondary | ICD-10-CM

## 2019-05-27 NOTE — Telephone Encounter
Patient left message stating that she needs to come in to have her pregnancy evaluated.     RN returned patient call. Patient states that her LMP was 02/18/2019. Patient has been attempting to see a GYN and as not been able to get an appointment. Patient had a tubal ligation in 2015. Since then patient has gotten pregnant twice (including current pregnancy). Pregnancy was confirmed by two pregnancy test, both in march. Patient states she has been having extreme right sided pain and is concerned for an ectopic pregnancy. Patient has had some spotting throughout pregnancy, however has never saturated more than a pad in consecutive hours. Patient denies any dizziness or light headedness at this time.     Patient was given bleeding precautions. Patient was advised that if she has any bleeding (more than a pad an hour), increased cramping, dizziness, or light headedness to go to the nearest ER. Patient was also advised that she can take 500 mg to 1000 mg every 6 to 8 hours as needed for pain, as well as, a heating pad/blanket for comfort.    Patient has been asked to come NPO to all appointments scheduled:    Future Appointments   Date Time Provider Department Center   05/30/2019  8:30 AM OBGYN [facility]   05/30/2019  9:00 AM Roney Marion PROVIDER [facility] OB/GY[facility]   06/10/2019  8:00 AM Neldon Newport, MD [facility]   07/17/2019  3:20 PM Maxwell Caul, MD [facility] IM       Patient will get labs prior to appointment around 0745.     Patient verbally understood all instructions. No further questions at this time.

## 2019-05-28 ENCOUNTER — Other Ambulatory Visit: Payer: Self-pay

## 2019-05-28 ENCOUNTER — Ambulatory Visit (INDEPENDENT_AMBULATORY_CARE_PROVIDER_SITE_OTHER): Payer: Medicaid Other | Admitting: Obstetrics

## 2019-05-28 ENCOUNTER — Encounter (INDEPENDENT_AMBULATORY_CARE_PROVIDER_SITE_OTHER): Payer: Self-pay | Admitting: Obstetrics

## 2019-05-28 VITALS — BP 122/74 | Ht 68.0 in | Wt 183.0 lb

## 2019-05-28 DIAGNOSIS — O99332 Smoking (tobacco) complicating pregnancy, second trimester: Secondary | ICD-10-CM

## 2019-05-28 DIAGNOSIS — R569 Unspecified convulsions: Secondary | ICD-10-CM

## 2019-05-28 DIAGNOSIS — O99320 Drug use complicating pregnancy, unspecified trimester: Secondary | ICD-10-CM

## 2019-05-28 DIAGNOSIS — Z141 Cystic fibrosis carrier: Secondary | ICD-10-CM

## 2019-05-28 DIAGNOSIS — F1190 Opioid use, unspecified, uncomplicated: Principal | ICD-10-CM

## 2019-05-28 DIAGNOSIS — F17210 Nicotine dependence, cigarettes, uncomplicated: Secondary | ICD-10-CM

## 2019-05-28 DIAGNOSIS — O99213 Obesity complicating pregnancy, third trimester: Secondary | ICD-10-CM | POA: Insufficient documentation

## 2019-05-28 DIAGNOSIS — Z3A24 24 weeks gestation of pregnancy: Secondary | ICD-10-CM

## 2019-05-28 DIAGNOSIS — Z9889 Other specified postprocedural states: Secondary | ICD-10-CM

## 2019-05-28 DIAGNOSIS — Z833 Family history of diabetes mellitus: Secondary | ICD-10-CM

## 2019-05-28 DIAGNOSIS — Z8759 Personal history of other complications of pregnancy, childbirth and the puerperium: Secondary | ICD-10-CM

## 2019-05-28 DIAGNOSIS — O99212 Obesity complicating pregnancy, second trimester: Secondary | ICD-10-CM

## 2019-05-28 DIAGNOSIS — O99342 Other mental disorders complicating pregnancy, second trimester: Secondary | ICD-10-CM

## 2019-05-28 DIAGNOSIS — F419 Anxiety disorder, unspecified: Secondary | ICD-10-CM

## 2019-05-28 DIAGNOSIS — E669 Obesity, unspecified: Secondary | ICD-10-CM

## 2019-05-28 DIAGNOSIS — O09292 Supervision of pregnancy with other poor reproductive or obstetric history, second trimester: Secondary | ICD-10-CM

## 2019-05-28 DIAGNOSIS — O99322 Drug use complicating pregnancy, second trimester: Secondary | ICD-10-CM

## 2019-05-28 NOTE — Nursing Note (Signed)
Chief Complaint:   Chief Complaint            Routine Prenatal Visit         Functional Health Screen  Functional Health Screening:         BP 122/74 (Site: Right, Patient Position: Sitting, Cuff Size: Adult)   Ht 1.727 m (5\' 8" )   Wt 83 kg (183 lb)   LMP 12/09/2018   Breastfeeding No   BMI 27.83 kg/m       Social History     Tobacco Use   Smoking Status Current Every Day Smoker   . Packs/day: 0.50   . Years: 14.00   . Pack years: 7.00   . Types: Cigarettes   Smokeless Tobacco Never Used     Patient Health Rating           Depression Screening  PHQ Questionnaire     Allergies:  Allergies   Allergen Reactions   . Penicillins      HIVES   . Propoxyphene      HIVES   . Sulfa (Sulfonamides)      HIVES   . Tramadol Swelling     Medication History  Reviewed for OTC medication and any new medications, provider will review medication history  Results through Enter/Edit  No results found for this or any previous visit (from the past 24 hour(s)).  POCT Results  Care Team  Patient Care Team:  Didden, Onalee Hua, MD as PCP - General (EXTERNAL)  Immunizations - last 24 hours     None        Paulo Fruit, [place]  05/28/2019, 14:28

## 2019-05-28 NOTE — Progress Notes (Signed)
Name: Kemoni Vlahakis MRN:  [id number]   Date: 05/28/2019 Age: 38 y.o. [birth year]          Subjective:   Ellen Dillon is being seen today for her obstetrical visit.  She is at [redacted]w[redacted]d weeks gestation.       Objective:   Weight: 83 kg (183 lb)  BP (Non-Invasive): 122/74  # of fetuses: 1  Protein: Negative  Glucose: Negative  Ketones: Negative            BP 122/74 (Site: Right, Patient Position: Sitting, Cuff Size: Adult)   Ht 1.727 m (5\' 8" )   Wt 83 kg (183 lb)   LMP 12/09/2018   Breastfeeding No   BMI 27.83 kg/m       Lab Results   Component Value Date    SYPHILISQL Non-reactive 02/27/2019    HIV12 NONREACTIVE 12/25/2011    UCULT NO GROWTH AFTER 2 DAYS INCUBATION. 12/25/2011     Assessment:   Pregnancy [redacted]w[redacted]d weeks    ICD-10-CM    1. 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 Smoking (tobacco) complicating pregnancy, second trimester O99.332    3. History of pregnancy induced hypertension Z87.59    4. Anxiety F41.9    5. Hx of induced abortion Z98.890    6. Hx of spontaneous abortion, currently pregnant, second trimester O09.292    7. Family history of diabetes mellitus in mother Z28.3    8. 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 Seizure (CMS HCC) R56.9    10. 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 See in office in a month and repeat US with MFM in 8 weeks. 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  Had previous miscarriage with D&C . UDS was positive for Cocaine and MJ and she had discussion of her drug use, anxiety, smoking and encouraged to reduce.    RTO 6 weeks.

## 2019-05-30 ENCOUNTER — Encounter: Admit: 2019-05-30 | Discharge: 2019-05-30

## 2019-05-30 ENCOUNTER — Ambulatory Visit: Admit: 2019-05-30 | Discharge: 2019-05-30

## 2019-05-30 ENCOUNTER — Ambulatory Visit: Admit: 2019-05-30 | Discharge: 2019-05-30 | Payer: PRIVATE HEALTH INSURANCE

## 2019-05-30 DIAGNOSIS — I2510 Atherosclerotic heart disease of native coronary artery without angina pectoris: Secondary | ICD-10-CM

## 2019-05-30 DIAGNOSIS — O3680X0 Pregnancy with inconclusive fetal viability, not applicable or unspecified: Principal | ICD-10-CM

## 2019-05-30 DIAGNOSIS — E039 Hypothyroidism, unspecified: Secondary | ICD-10-CM

## 2019-05-30 DIAGNOSIS — K929 Disease of digestive system, unspecified: Secondary | ICD-10-CM

## 2019-05-30 DIAGNOSIS — M1990 Unspecified osteoarthritis, unspecified site: Secondary | ICD-10-CM

## 2019-05-30 DIAGNOSIS — Z3687 Encounter for antenatal screening for uncertain dates: Secondary | ICD-10-CM

## 2019-05-30 DIAGNOSIS — M549 Dorsalgia, unspecified: Secondary | ICD-10-CM

## 2019-05-30 DIAGNOSIS — E119 Type 2 diabetes mellitus without complications: Secondary | ICD-10-CM

## 2019-05-30 DIAGNOSIS — E785 Hyperlipidemia, unspecified: Secondary | ICD-10-CM

## 2019-05-30 DIAGNOSIS — F329 Major depressive disorder, single episode, unspecified: Secondary | ICD-10-CM

## 2019-05-30 DIAGNOSIS — G43909 Migraine, unspecified, not intractable, without status migrainosus: Secondary | ICD-10-CM

## 2019-05-30 DIAGNOSIS — F419 Anxiety disorder, unspecified: Secondary | ICD-10-CM

## 2019-05-30 DIAGNOSIS — J45909 Unspecified asthma, uncomplicated: Secondary | ICD-10-CM

## 2019-05-30 DIAGNOSIS — I10 Essential (primary) hypertension: Secondary | ICD-10-CM

## 2019-05-30 DIAGNOSIS — J349 Unspecified disorder of nose and nasal sinuses: Secondary | ICD-10-CM

## 2019-05-30 NOTE — Patient Instructions
For up to date information on the COVID-19 virus, visit the [facility] website. [email or web address]  ??? General supportive care during cold and flu season and infection prevention reminders:    o Wash hands often with soap and water for at least 20 seconds   o Cover your mouth and nose   o Social distancing: try to maintain 6 feet between you and other people   o Stay home if sick and symptoms mild or manageable?  ??? If you must be around people wear a mask    ??? If you are having symptoms of a lower respiratory infection (cough, shortness of breath) and/or fever AND either traveled in last 30 days (internationally or to region of exposure) OR known exposure to patient with COVID19:     o Call your primary care provider for questions or health needs.   ??? Tell your doctor about your recent travel and your symptoms     o In a medical emergency, call 911 or go to the nearest emergency room.    General Instructions  To Contact Me:   Please send a MyChart message to Ob/Gyn or call [phone] to reach your doctor???s nurse team.  To Have Medication Refilled:   Please use the MyChart Refill request or contact your pharmacy directly to request medication refills. Please allow 72 hours.  To Receive Your Test Results:   You will receive your test results in person at your appointment, or by MyChart if you are signed up. We prefer to discuss test results during your appointment time, so please try to have your labs done several days before your next routine visit. If unable to discuss in person, your results will either be sent to you via MyChart, by telephone, or by letter. If you are expecting results and have not heard from my office within 2 weeks of your testing, please send a MyChart message or call my office.  Our Lab (Location, Hours, and Fasting Information)  Lab Location: the 1st floor of the Medical Office Building, directly to the left of the Information Desk. Lab Hours: Monday 6:30 am-6 pm, Tuesday-Friday 7 am-6 pm, and Saturday 7 am-noon.   Fasting for Lab: For fasting labs, please:   Do not eat for at least 8-10 hours before having your labs drawn, drink plenty of water, and make sure to continue your medications as prescribed (unless otherwise specified).  Radiology is on the 2nd floor of the Medical Office Building.  To Schedule an Appointment:   Contact our schedulers at [phone] and select 1. If it has been over a year since your last appointment, please ask for an annual or yearly physical appointment.   Receive Appointment Reminders on Your Cell Phone:   Make sure we have your cell phone number, and text [place] to [phone].  For Urgent Questions:   On nights, weekends or holidays, call the operator at [phone], and ask for the Ob/Gyn on call.   For emergencies, call 911.    We value your feedback and you may receive a survey about your experience with our office. If you had a favorable experience, the only positive survey results that we will receive are if we are rated in the 9 or 10 range out of 10 points.

## 2019-05-30 NOTE — Progress Notes
Date of Service: 05/30/2019    Subjective:             Dorothy Todd is a 38 y.o. female here for consultation of possible early pregnancy in setting of tubal.     History of Present Illness  Patietn LMP 02/18/19, then took two positive pregnancy tests in March. She was having severe pain a week ago and spotting. Went to [facility] ER. Did not do ultrasound, reports no pregnancy test done. Received toradol and told to follow up.     Sono today showed no IUP or masses concerning for ectopic. Urine pregnancy test neg. Beta HCG neg.     Had filshie clamp BTL in 2014 during CS for her last child.   In 2016 was having issues with very heavy menses. Underwent diagnostic D&C, then a week later and endometrial ablation. Ablation helped with her bleeding but she continues to have severe dysmenorrhea. She is interest    OB: SVDx3, SABx1, CSx1   Gyn: h/o abnormal pap smears, unknown last pap  No h/o STI          Review of Systems   Constitutional: Negative for fatigue, fever and unexpected weight change.   HENT: Negative for voice change.    Respiratory: Negative for cough and shortness of breath.    Cardiovascular: Negative for chest pain and leg swelling.   Gastrointestinal: Negative for abdominal pain, blood in stool, constipation, diarrhea, nausea and vomiting.   Genitourinary: Negative for difficulty urinating, dyspareunia, dysuria, enuresis, frequency, genital sores, hematuria, menstrual problem, pelvic pain, urgency, vaginal bleeding, vaginal discharge and vaginal pain.   Musculoskeletal: Negative for arthralgias and back pain.   Skin: Negative for rash.   Neurological: Negative for light-headedness and headaches.   Hematological: Negative for adenopathy. Does not bruise/bleed easily.   Psychiatric/Behavioral: Negative for confusion. The patient is not nervous/anxious.      Medical History:   Diagnosis Date   ??? Anxiety    ??? Arthritis     Osteo   ??? Asthma    ??? Back pain    ??? Coronary artery disease ??? Depression    ??? Diabetes (HCC)    ??? Gastrointestinal disorder     Well controlled   ??? HTN (hypertension)    ??? Hyperlipidemia    ??? Hypothyroidism    ??? Migraines    ??? Sinus problem     a mass of mold     Surgical History:   Procedure Laterality Date   ??? ENDOMETRIAL ABLATION  2016   ??? HX TONSILLECTOMY  2017   ??? ENDOSCOPY NASAL/ SINUS WITH ETHMOIDECTOMY AND SPHENOIDOTOMY/ TISSUE REMOVAL Left 09/18/2018    Performed by Neldon Newport, MD at [facility] OR   ??? ENDOSCOPY NASAL/ SINUS WITH EXCISION TISSUE MAXILLARY SINUS Left 09/18/2018    Performed by Neldon Newport, MD at [facility]   ??? STEREOTACTIC COMPUTER-ASSISTED CRANIAL PROCEDURE - EXTRADURAL N/A 09/18/2018    Performed by Neldon Newport, MD at [facility]   ??? HX CESAREAN SECTION      GA   ??? HX TUBAL LIGATION       Family History   Problem Relation Age of Onset   ??? Diabetes Mother    ??? Hypertension Mother    ??? Diabetes Maternal Grandmother    ??? Heart Disease Maternal Grandmother    ??? Stroke Maternal Grandmother    ??? High Cholesterol Maternal Grandmother    ??? Diabetes Maternal Grandfather    ???  Diabetes Paternal Grandmother    ??? Cancer Paternal Grandmother    ??? High Cholesterol Paternal Grandmother    ??? Stroke Paternal Grandmother    ??? Heart Disease Paternal Grandmother    ??? Diabetes Paternal Grandfather    ??? Hypertension Father      Social History     Socioeconomic History   ??? Marital status: Married     Spouse name: Not on file   ??? Number of children: Not on file   ??? Years of education: Not on file   ??? Highest education level: Not on file   Occupational History   ??? Not on file   Tobacco Use   ??? Smoking status: Never Smoker   ??? Smokeless tobacco: Never Used   Substance and Sexual Activity   ??? Alcohol use: Yes     Comment: occ.   ??? Drug use: No   ??? Sexual activity: Not on file   Other Topics Concern   ??? Not on file   Social History Narrative   ??? Not on file            Objective:         ??? acetaminophen (TYLENOL) 325 mg tablet Take two tablets by mouth every 4 hours as needed for Pain.   ??? ALPRAZolam (XANAX) 0.25 mg tablet Take 0.25 mg by mouth at bedtime as needed for Anxiety.   ??? atenolol (TENORMIN) 25 mg tablet Take 25 mg by mouth daily.   ??? atorvastatin (LIPITOR) 20 mg tablet Take 20 mg by mouth at bedtime daily.   ??? azelastine(+) (ASTELIN) 137 mcg (0.1 %) nasal spray Apply two sprays to each nostril as directed twice daily. Use in each nostril as directed   ??? cetirizine (ZYRTEC) 10 mg tablet Take 10 mg by mouth daily as needed.   ??? diclofenac(+) (VOLTAREN) 1 % topical gel Apply two g topically to affected area three times daily as needed.   ??? duloxetine DR (CYMBALTA) 60 mg capsule Take 60 mg by mouth at bedtime daily.   ??? fluticasone propionate (FLONASE) 50 mcg/actuation nasal spray Apply two sprays to each nostril as directed twice daily. Shake bottle gently before using.   ??? fluticasone propionate (FLOVENT HFA) 220 mcg/actuation inhaler Inhale 2 puffs by mouth into the lungs every 12 hours.   ??? furosemide (LASIX) 20 mg tablet Take 20 mg by mouth daily as needed.   ??? gabapentin (NEURONTIN) 300 mg capsule Take 300 mg by mouth every 8 hours.   ??? hydroCHLOROthiazide (HYDRODIURIL) 25 mg tablet Take one tablet by mouth every morning.   ??? HYDROcodone/acetaminophen (NORCO) 5/325 mg tablet Take 1 tablet by mouth every 4 hours as needed for Pain   ??? levothyroxine (SYNTHROID) 25 mcg tablet Take 25 mcg by mouth daily 30 minutes before breakfast.   ??? liraglutide(+) (VICTOZA) 0.6 mg/0.1 mL (18 mg/3 mL) pnij Inject 0.6 mg under the skin daily.   ??? lisinopril (PRINIVIL; ZESTRIL) 10 mg tablet Take 10 mg by mouth daily.   ??? methylPREDNIsolone (MEDROL DOSEPAK) 4 mg tablet Take medication as directed on package for 6 days. Take with food.   ??? ondansetron (ZOFRAN ODT) 4 mg rapid dissolve tablet Dissolve one tablet by mouth every 8 hours as needed for Nausea or Vomiting. Place on tongue to disolve.   ??? oxyCODONE (ROXICODONE, OXY-IR) 5 mg tablet Take one tablet by mouth every 4 hours as needed for Pain   ??? pantoprazole DR (PROTONIX) 40 mg tablet Take 40 mg  by mouth at bedtime daily.   ??? potassium chloride SR (K-DUR) 20 mEq tablet Take 20 mEq by mouth daily as needed. Take with a meal and a full glass of water.    ??? prednisone (DELTASONE) 10 mg tablet Take 2 tablets daily for 5 days, then 1 for 5 days   ??? sodium chloride/aloe vera (AYR) nasal spray Apply one spray to two sprays to each nostril as directed four times daily.   ??? tiZANidine (ZANAFLEX) 4 mg tablet Take 4 mg by mouth every 8 hours as needed.   ??? zolpidem (AMBIEN) 10 mg tablet Take 10 mg by mouth at bedtime as needed for Sleep.     Vitals:    05/30/19 1109   BP: 129/59   BP Source: Arm, Left Upper   Patient Position: Sitting   Pulse: 91   Weight: 123.3 kg (271 lb 12.8 oz)   Height: 167.6 cm (66)   PainSc: Zero     Body mass index is 43.87 kg/m???.     Physical Exam     General:  NAD    HEENT:  Deferred    Breasts:  Normal without suspicious masses, skin/nipple changes or axillary nodes    Lungs:  Effort normal. CTAB   Heart:  Normal rate. Intact distal pulses.    Abdomen:  Soft. No distension and no masses. Nontender. No rebound and no guarding.    Pysch:  Oriented, normal mood             Assessment and Plan:  38 y.o. [id number] here for +pregnancy test in setting of prior tubal. No longer pregnant. Beta HCG neg.   >Desires hysterectomy  >Will RTC 2-4 weeks to discuss hysterectomy     D/w Dr. Lexine Baton, MD  PGY-4 Obstetrics and Gynecology

## 2019-05-30 NOTE — Progress Notes
Dorothy Todd presents for an ultrasound encounter. Past Medical, Surgical, Family & Social History; Medications & Allergies contained in the electronic record below were not reviewed today and may not be up-to-date. Please see A/S OBGYN report for all documentation related to this encounter.    05/30/2019  Brody Bonneau Tamala Julian

## 2019-06-02 NOTE — Progress Notes
Agree with above. Patient presents to [facility] for possible pregnancy in setting of post BTL, however both urine and serum pregnancy tests are negative and ultrasound normal.   Desires to establish GYN care for possible hysterectomy. RTC to discuss.     Darnelle Bos MD

## 2019-06-10 ENCOUNTER — Ambulatory Visit
Admission: RE | Admit: 2019-06-10 | Discharge: 2019-06-10 | Disposition: A | Discharge status: Home / Self Care | Payer: No Typology Code available for payment source | Source: Ambulatory Visit | Attending: Obstetrics & Gynecology | Admitting: Obstetrics & Gynecology

## 2019-06-10 ENCOUNTER — Encounter: Payer: Self-pay | Admitting: Obstetrics & Gynecology

## 2019-06-10 DIAGNOSIS — Z141 Cystic fibrosis carrier: Secondary | ICD-10-CM | POA: Insufficient documentation

## 2019-06-10 DIAGNOSIS — Z3A26 26 weeks gestation of pregnancy: Secondary | ICD-10-CM | POA: Insufficient documentation

## 2019-06-10 DIAGNOSIS — Z3490 Encounter for supervision of normal pregnancy, unspecified, unspecified trimester: Secondary | ICD-10-CM

## 2019-06-10 DIAGNOSIS — O355XX0 Maternal care for (suspected) damage to fetus by drugs, not applicable or unspecified: Secondary | ICD-10-CM | POA: Insufficient documentation

## 2019-06-10 DIAGNOSIS — O09522 Supervision of elderly multigravida, second trimester: Secondary | ICD-10-CM | POA: Insufficient documentation

## 2019-06-13 ENCOUNTER — Encounter: Admit: 2019-06-13 | Discharge: 2019-06-13

## 2019-06-13 ENCOUNTER — Ambulatory Visit: Admit: 2019-06-13 | Discharge: 2019-06-13 | Payer: PRIVATE HEALTH INSURANCE

## 2019-06-13 ENCOUNTER — Ambulatory Visit: Admit: 2019-06-13 | Discharge: 2019-06-13

## 2019-06-13 DIAGNOSIS — K929 Disease of digestive system, unspecified: Secondary | ICD-10-CM

## 2019-06-13 DIAGNOSIS — I2510 Atherosclerotic heart disease of native coronary artery without angina pectoris: Secondary | ICD-10-CM

## 2019-06-13 DIAGNOSIS — F329 Major depressive disorder, single episode, unspecified: Secondary | ICD-10-CM

## 2019-06-13 DIAGNOSIS — N939 Abnormal uterine and vaginal bleeding, unspecified: Secondary | ICD-10-CM

## 2019-06-13 DIAGNOSIS — M549 Dorsalgia, unspecified: Secondary | ICD-10-CM

## 2019-06-13 DIAGNOSIS — E119 Type 2 diabetes mellitus without complications: Secondary | ICD-10-CM

## 2019-06-13 DIAGNOSIS — N9985 Post endometrial ablation syndrome: Secondary | ICD-10-CM

## 2019-06-13 DIAGNOSIS — I10 Essential (primary) hypertension: Secondary | ICD-10-CM

## 2019-06-13 DIAGNOSIS — E039 Hypothyroidism, unspecified: Secondary | ICD-10-CM

## 2019-06-13 DIAGNOSIS — M1990 Unspecified osteoarthritis, unspecified site: Secondary | ICD-10-CM

## 2019-06-13 DIAGNOSIS — B009 Herpesviral infection, unspecified: Secondary | ICD-10-CM

## 2019-06-13 DIAGNOSIS — J45909 Unspecified asthma, uncomplicated: Secondary | ICD-10-CM

## 2019-06-13 DIAGNOSIS — G43909 Migraine, unspecified, not intractable, without status migrainosus: Secondary | ICD-10-CM

## 2019-06-13 DIAGNOSIS — F419 Anxiety disorder, unspecified: Secondary | ICD-10-CM

## 2019-06-13 DIAGNOSIS — J349 Unspecified disorder of nose and nasal sinuses: Secondary | ICD-10-CM

## 2019-06-13 DIAGNOSIS — E785 Hyperlipidemia, unspecified: Secondary | ICD-10-CM

## 2019-06-13 NOTE — Progress Notes
Date of Service: 06/13/2019    Subjective:             Dorothy Todd is a 38 y.o. female.    37yo [id number] with h/o DMII and essential HTN who presents to discuss management of her periods.     Reports that she had very heavy cycles since 2016.   Had a D&C done at the time for bleeding.   After the [facility] they performed an ablation.   Has had persistent cycles even after the ablation.   Reports regular very painful cycles every month until February. Has tried ibuprofen with minimal improvement.   Then didn't have a period and had a pos UPT   Was seen in [facility] last week and had negative HCG and normal ultrasound with [facility]>     Has previously tried hormone therapy for her bleeding. Loestrin OCPs  Has hypertension so the options are limited.     Also reports h/o abnormal paps.   In 2014 lived in [place] and had a few abnoraml paps and two colposcopies.   Last pap was 2 years ago in [place] and was normal.   Was due for a pap last year, but hasnt gotten it.     Has also had a history of ovarian cysts. The cysts come and go.  Has a lot of pain with them but unsure if it is more with the cysts or with her cycles.         Medical History:   Diagnosis Date   ??? Anxiety    ??? Arthritis     Osteo   ??? Asthma    ??? Back pain    ??? Coronary artery disease    ??? Depression    ??? Diabetes (HCC)    ??? Gastrointestinal disorder     Well controlled   ??? Herpes    ??? HTN (hypertension)    ??? Hyperlipidemia    ??? Hypothyroidism    ??? Migraines    ??? Sinus problem     a mass of mold     Surgical History:   Procedure Laterality Date   ??? ENDOMETRIAL ABLATION  2016   ??? HX TONSILLECTOMY  2017   ??? ENDOSCOPY NASAL/ SINUS WITH ETHMOIDECTOMY AND SPHENOIDOTOMY/ TISSUE REMOVAL Left 09/18/2018    Performed by Neldon Newport, MD at [facility]   ??? ENDOSCOPY NASAL/ SINUS WITH EXCISION TISSUE MAXILLARY SINUS Left 09/18/2018    Performed by Neldon Newport, MD at [facility]   ??? STEREOTACTIC COMPUTER-ASSISTED CRANIAL PROCEDURE - EXTRADURAL N/A 09/18/2018 Performed by Neldon Newport, MD at [facility]   ??? HX CESAREAN SECTION      GA   ??? HX TUBAL LIGATION       Family History   Problem Relation Age of Onset   ??? Diabetes Mother    ??? Hypertension Mother    ??? Diabetes Maternal Grandmother    ??? Heart Disease Maternal Grandmother    ??? Stroke Maternal Grandmother    ??? High Cholesterol Maternal Grandmother    ??? Diabetes Maternal Grandfather    ??? Diabetes Paternal Grandmother    ??? Cancer Paternal Grandmother         cervical   ??? High Cholesterol Paternal Grandmother    ??? Stroke Paternal Grandmother    ??? Heart Disease Paternal Grandmother    ??? Cervical Cancer Paternal Grandmother    ??? Diabetes Paternal Grandfather    ??? Hypertension Father    ???  Cancer Paternal Aunt         breast&stomach   ??? Cancer-Breast Paternal Aunt    ??? Stomach Cancer Paternal Aunt    ??? Cancer Paternal Aunt         breast&cervical   ??? Cancer-Breast Paternal Aunt    ??? Cervical Cancer Paternal Aunt    ??? Cancer-Colon Neg Hx    ??? Cancer-Ovarian Neg Hx    ??? Cancer-Uterine Neg Hx      Social History     Tobacco Use   ??? Smoking status: Never Smoker   ??? Smokeless tobacco: Never Used   Substance Use Topics   ??? Alcohol use: Yes     Comment: occ.   ??? Drug use: No            Review of Systems   Constitutional: Negative for fatigue, fever and unexpected weight change.   HENT: Negative for voice change.    Respiratory: Negative for cough and shortness of breath.    Cardiovascular: Negative for chest pain and leg swelling.   Gastrointestinal: Negative for abdominal pain, blood in stool, constipation, diarrhea, nausea and vomiting.   Genitourinary: Negative for difficulty urinating, dyspareunia, dysuria, enuresis, frequency, genital sores, hematuria, menstrual problem, pelvic pain, urgency, vaginal bleeding, vaginal discharge and vaginal pain.   Musculoskeletal: Positive for arthralgias. Negative for back pain.   Skin: Negative for rash.   Allergic/Immunologic: Positive for environmental allergies. Negative for food allergies and immunocompromised state.   Neurological: Negative for light-headedness and headaches.   Hematological: Negative for adenopathy. Does not bruise/bleed easily.   Psychiatric/Behavioral: Negative for confusion. The patient is not nervous/anxious.          Objective:         ??? acetaminophen (TYLENOL) 325 mg tablet Take two tablets by mouth every 4 hours as needed for Pain.   ??? ALPRAZolam (XANAX) 0.25 mg tablet Take 0.25 mg by mouth at bedtime as needed for Anxiety.   ??? atenolol (TENORMIN) 25 mg tablet Take 25 mg by mouth daily.   ??? atorvastatin (LIPITOR) 20 mg tablet Take 20 mg by mouth at bedtime daily.   ??? azelastine(+) (ASTELIN) 137 mcg (0.1 %) nasal spray Apply two sprays to each nostril as directed twice daily. Use in each nostril as directed   ??? cetirizine (ZYRTEC) 10 mg tablet Take 10 mg by mouth daily as needed.   ??? diclofenac(+) (VOLTAREN) 1 % topical gel Apply two g topically to affected area three times daily as needed.   ??? duloxetine DR (CYMBALTA) 60 mg capsule Take 60 mg by mouth at bedtime daily.   ??? fluticasone propionate (FLONASE) 50 mcg/actuation nasal spray Apply two sprays to each nostril as directed twice daily. Shake bottle gently before using.   ??? fluticasone propionate (FLOVENT HFA) 220 mcg/actuation inhaler Inhale 2 puffs by mouth into the lungs every 12 hours.   ??? furosemide (LASIX) 20 mg tablet Take 20 mg by mouth daily as needed.   ??? gabapentin (NEURONTIN) 300 mg capsule Take 300 mg by mouth every 8 hours.   ??? hydroCHLOROthiazide (HYDRODIURIL) 25 mg tablet Take one tablet by mouth every morning.   ??? HYDROcodone/acetaminophen (NORCO) 5/325 mg tablet Take 1 tablet by mouth every 4 hours as needed for Pain   ??? levothyroxine (SYNTHROID) 25 mcg tablet Take 25 mcg by mouth daily 30 minutes before breakfast.   ??? liraglutide(+) (VICTOZA) 0.6 mg/0.1 mL (18 mg/3 mL) pnij Inject 0.6 mg under the skin daily.   ???  lisinopril (PRINIVIL; ZESTRIL) 10 mg tablet Take 10 mg by mouth daily. ??? methylPREDNIsolone (MEDROL DOSEPAK) 4 mg tablet Take medication as directed on package for 6 days. Take with food.   ??? ondansetron (ZOFRAN ODT) 4 mg rapid dissolve tablet Dissolve one tablet by mouth every 8 hours as needed for Nausea or Vomiting. Place on tongue to disolve.   ??? oxyCODONE (ROXICODONE, OXY-IR) 5 mg tablet Take one tablet by mouth every 4 hours as needed for Pain   ??? pantoprazole DR (PROTONIX) 40 mg tablet Take 40 mg by mouth at bedtime daily.   ??? potassium chloride SR (K-DUR) 20 mEq tablet Take 20 mEq by mouth daily as needed. Take with a meal and a full glass of water.    ??? prednisone (DELTASONE) 10 mg tablet Take 2 tablets daily for 5 days, then 1 for 5 days   ??? sodium chloride/aloe vera (AYR) nasal spray Apply one spray to two sprays to each nostril as directed four times daily.   ??? tiZANidine (ZANAFLEX) 4 mg tablet Take 4 mg by mouth every 8 hours as needed.   ??? zolpidem (AMBIEN) 10 mg tablet Take 10 mg by mouth at bedtime as needed for Sleep.     Vitals:    06/13/19 1019   BP: 125/45   BP Source: Arm, Left Upper   Patient Position: Sitting   Pulse: 82   Weight: 126 kg (277 lb 12.8 oz)   Height: 168.9 cm (66.5)   PainSc: Ten     Body mass index is 44.17 kg/m???.     Physical Exam  Constitutional:       Appearance: Normal appearance.   HENT:      Head: Normocephalic and atraumatic.   Abdominal:      General: There is no distension.      Palpations: Abdomen is soft.      Tenderness: There is no abdominal tenderness.      Comments: obese   Genitourinary:     Comments: Normal external female genitalia, vaginal mucosa pink/rugated/moist, cervix normal/nontender, uterus nontender, exam limited due to body habitus.   Skin:     General: Skin is warm and dry.   Neurological:      General: No focal deficit present.      Mental Status: She is alert and oriented to person, place, and time.   Psychiatric:         Mood and Affect: Mood normal.         Behavior: Behavior normal. Assessment and Plan:      1. Post ablation syndrome: reviewed treatment options are limited especially given h/o HTN. Recommended Total laparoscopic hysterectomy, bilateral salpingectomy, cystoscopy. Discussed risks of procedure including risk of infection, bleeding, damage to surrounding structures including bowel, bladder, blood vessles, nerves. Risk of open procedure.     Patient reports she would also like ovaries removed given h/o ovarian cysts. Recommended against removal of both ovaries, but discussed that if there is a large cyst or other concerning feature of an ovary we could either remove the cyst or ovary at the time of surgery. She has not had a non-pregnancy ultrasound in several years. Will plan for pelvic ultrasound with radiology for better evaluation of the uterus and ovaries.     2. Cervical cancer screening: due for pap. Collected today. Abnormal >5 years ago.     All questions answered. Will place prep for case after evaluation by ultrasound and pap smear results.     Marcelle Smiling   MD

## 2019-06-18 ENCOUNTER — Encounter: Admit: 2019-06-18 | Discharge: 2019-06-18

## 2019-06-18 DIAGNOSIS — N9985 Post endometrial ablation syndrome: Secondary | ICD-10-CM

## 2019-06-18 DIAGNOSIS — N939 Abnormal uterine and vaginal bleeding, unspecified: Secondary | ICD-10-CM

## 2019-06-18 DIAGNOSIS — N946 Dysmenorrhea, unspecified: Secondary | ICD-10-CM

## 2019-06-18 DIAGNOSIS — Z1159 Encounter for screening for other viral diseases: Secondary | ICD-10-CM

## 2019-06-24 ENCOUNTER — Encounter: Admit: 2019-06-24 | Discharge: 2019-06-24

## 2019-06-24 ENCOUNTER — Ambulatory Visit: Admit: 2019-06-24 | Discharge: 2019-06-25

## 2019-06-24 ENCOUNTER — Telehealth (INDEPENDENT_AMBULATORY_CARE_PROVIDER_SITE_OTHER): Payer: Self-pay | Admitting: Obstetrics

## 2019-06-24 DIAGNOSIS — J45909 Unspecified asthma, uncomplicated: Secondary | ICD-10-CM

## 2019-06-24 DIAGNOSIS — F419 Anxiety disorder, unspecified: Secondary | ICD-10-CM

## 2019-06-24 DIAGNOSIS — I2510 Atherosclerotic heart disease of native coronary artery without angina pectoris: Secondary | ICD-10-CM

## 2019-06-24 DIAGNOSIS — N9985 Post endometrial ablation syndrome: Secondary | ICD-10-CM

## 2019-06-24 DIAGNOSIS — Z1159 Encounter for screening for other viral diseases: Secondary | ICD-10-CM

## 2019-06-24 DIAGNOSIS — K929 Disease of digestive system, unspecified: Secondary | ICD-10-CM

## 2019-06-24 DIAGNOSIS — G43909 Migraine, unspecified, not intractable, without status migrainosus: Secondary | ICD-10-CM

## 2019-06-24 DIAGNOSIS — F329 Major depressive disorder, single episode, unspecified: Secondary | ICD-10-CM

## 2019-06-24 DIAGNOSIS — B009 Herpesviral infection, unspecified: Secondary | ICD-10-CM

## 2019-06-24 DIAGNOSIS — E039 Hypothyroidism, unspecified: Secondary | ICD-10-CM

## 2019-06-24 DIAGNOSIS — J349 Unspecified disorder of nose and nasal sinuses: Secondary | ICD-10-CM

## 2019-06-24 DIAGNOSIS — N946 Dysmenorrhea, unspecified: Principal | ICD-10-CM

## 2019-06-24 DIAGNOSIS — E119 Type 2 diabetes mellitus without complications: Secondary | ICD-10-CM

## 2019-06-24 DIAGNOSIS — E785 Hyperlipidemia, unspecified: Secondary | ICD-10-CM

## 2019-06-24 DIAGNOSIS — I10 Essential (primary) hypertension: Secondary | ICD-10-CM

## 2019-06-24 DIAGNOSIS — J310 Chronic rhinitis: Secondary | ICD-10-CM

## 2019-06-24 DIAGNOSIS — J324 Chronic pansinusitis: Principal | ICD-10-CM

## 2019-06-24 DIAGNOSIS — N939 Abnormal uterine and vaginal bleeding, unspecified: Secondary | ICD-10-CM

## 2019-06-24 DIAGNOSIS — M549 Dorsalgia, unspecified: Secondary | ICD-10-CM

## 2019-06-24 DIAGNOSIS — M1990 Unspecified osteoarthritis, unspecified site: Secondary | ICD-10-CM

## 2019-06-24 DIAGNOSIS — Z9889 Other specified postprocedural states: Secondary | ICD-10-CM

## 2019-06-24 NOTE — Telephone Encounter
-----   Message from Suzzanne Cloud sent at 06/24/2019 12:38 PM CDT -----  Patient is scheduled for surgery with Dr Genene Churn on 7/27 and will need COVID testing.

## 2019-06-24 NOTE — Progress Notes
Date of Service: 06/24/2019    Subjective:             Dorothy Todd is a 38 y.o. female.    History of Present Illness       Review of Systems   Constitutional: Negative.    HENT: Positive for congestion.    Eyes: Negative.    Respiratory: Negative.    Cardiovascular: Negative.    Gastrointestinal: Negative.    Endocrine: Negative.    Genitourinary: Negative.    Musculoskeletal: Negative.    Skin: Negative.    Allergic/Immunologic: Negative.    Neurological: Negative.    Hematological: Negative.    Psychiatric/Behavioral: Negative.          Objective:         ??? acetaminophen (TYLENOL) 325 mg tablet Take two tablets by mouth every 4 hours as needed for Pain.   ??? ALPRAZolam (XANAX) 0.25 mg tablet Take 0.25 mg by mouth at bedtime as needed for Anxiety.   ??? atenolol (TENORMIN) 25 mg tablet Take 25 mg by mouth daily.   ??? atorvastatin (LIPITOR) 20 mg tablet Take 20 mg by mouth at bedtime daily.   ??? azelastine(+) (ASTELIN) 137 mcg (0.1 %) nasal spray Apply two sprays to each nostril as directed twice daily. Use in each nostril as directed   ??? cetirizine (ZYRTEC) 10 mg tablet Take 10 mg by mouth daily as needed.   ??? diclofenac(+) (VOLTAREN) 1 % topical gel Apply two g topically to affected area three times daily as needed.   ??? duloxetine DR (CYMBALTA) 60 mg capsule Take 60 mg by mouth at bedtime daily.   ??? fluticasone propionate (FLONASE) 50 mcg/actuation nasal spray Apply two sprays to each nostril as directed twice daily. Shake bottle gently before using.   ??? fluticasone propionate (FLOVENT HFA) 220 mcg/actuation inhaler Inhale 2 puffs by mouth into the lungs every 12 hours.   ??? furosemide (LASIX) 20 mg tablet Take 20 mg by mouth daily as needed.   ??? gabapentin (NEURONTIN) 300 mg capsule Take 300 mg by mouth every 8 hours.   ??? hydroCHLOROthiazide (HYDRODIURIL) 25 mg tablet Take one tablet by mouth every morning.   ??? HYDROcodone/acetaminophen (NORCO) 5/325 mg tablet Take 1 tablet by mouth every 4 hours as needed for Pain   ??? levothyroxine (SYNTHROID) 25 mcg tablet Take 25 mcg by mouth daily 30 minutes before breakfast.   ??? liraglutide(+) (VICTOZA) 0.6 mg/0.1 mL (18 mg/3 mL) pnij Inject 0.6 mg under the skin daily.   ??? lisinopril (PRINIVIL; ZESTRIL) 10 mg tablet Take 10 mg by mouth daily.   ??? methylPREDNIsolone (MEDROL DOSEPAK) 4 mg tablet Take medication as directed on package for 6 days. Take with food.   ??? ondansetron (ZOFRAN ODT) 4 mg rapid dissolve tablet Dissolve one tablet by mouth every 8 hours as needed for Nausea or Vomiting. Place on tongue to disolve.   ??? oxyCODONE (ROXICODONE, OXY-IR) 5 mg tablet Take one tablet by mouth every 4 hours as needed for Pain   ??? pantoprazole DR (PROTONIX) 40 mg tablet Take 40 mg by mouth at bedtime daily.   ??? potassium chloride SR (K-DUR) 20 mEq tablet Take 20 mEq by mouth daily as needed. Take with a meal and a full glass of water.    ??? prednisone (DELTASONE) 10 mg tablet Take 2 tablets daily for 5 days, then 1 for 5 days   ??? sodium chloride/aloe vera (AYR) nasal spray Apply one spray to two sprays to  each nostril as directed four times daily.   ??? tiZANidine (ZANAFLEX) 4 mg tablet Take 4 mg by mouth every 8 hours as needed.   ??? zolpidem (AMBIEN) 10 mg tablet Take 10 mg by mouth at bedtime as needed for Sleep.     Vitals:    06/24/19 0911   BP: 123/68   BP Source: Arm, Right Upper   Patient Position: Sitting   Pulse: 69   Temp: 36.8 ???C (98.2 ???F)   Weight: 124.3 kg (274 lb)   Height: 167.6 cm (66)   PainSc: Zero     Body mass index is 44.22 kg/m???.     Physical Exam         Assessment and Plan:

## 2019-06-24 NOTE — Progress Notes
chief complaint           HPI:  Dorothy Todd is a 38 y.o. female who I was asked to see in consultation for evaluation of chronic sinusitis.  S/p surgery for left sphenoid fungal ball a year ago.  Feels like she is having increased congestion over the past couple of months - predominatly on the left.  Getting some brownish-greenish discharge out of both sides.  Sense of smell good on right; doesn't feel like she smells too good on the left.  Been getting a little funky smell.    Has a history of asthma which has also been worse.  Getting some nosebleeds as well.  Has been using flutciasone and azelastine every day.  No new envrionmental changes.  Been allergy tested here and positive and has been on allergy shots for the past couple of years and getting 2x week.      The following portions of the patient's history were reviewed and updated as appropriate: allergies, current medications, past family history, past medical history, past social history, past surgical history and problem list.    ROS above reviewed      Current Outpatient Medications:   ???  acetaminophen (TYLENOL) 325 mg tablet, Take two tablets by mouth every 4 hours as needed for Pain., Disp: 30 tablet, Rfl: 0  ???  ALPRAZolam (XANAX) 0.25 mg tablet, Take 0.25 mg by mouth at bedtime as needed for Anxiety., Disp: , Rfl:   ???  atenolol (TENORMIN) 25 mg tablet, Take 25 mg by mouth daily., Disp: , Rfl:   ???  atorvastatin (LIPITOR) 20 mg tablet, Take 20 mg by mouth at bedtime daily., Disp: , Rfl:   ???  azelastine(+) (ASTELIN) 137 mcg (0.1 %) nasal spray, Apply two sprays to each nostril as directed twice daily. Use in each nostril as directed, Disp: 90 mL, Rfl: 3  ???  cetirizine (ZYRTEC) 10 mg tablet, Take 10 mg by mouth daily as needed., Disp: , Rfl:   ???  diclofenac(+) (VOLTAREN) 1 % topical gel, Apply two g topically to affected area three times daily as needed., Disp: 200 g, Rfl: 0  ???  duloxetine DR (CYMBALTA) 60 mg capsule, Take 60 mg by mouth at bedtime daily., Disp: , Rfl:   ???  fluticasone propionate (FLONASE) 50 mcg/actuation nasal spray, Apply two sprays to each nostril as directed twice daily. Shake bottle gently before using., Disp: 96 g, Rfl: 3  ???  fluticasone propionate (FLOVENT HFA) 220 mcg/actuation inhaler, Inhale 2 puffs by mouth into the lungs every 12 hours., Disp: , Rfl:   ???  furosemide (LASIX) 20 mg tablet, Take 20 mg by mouth daily as needed., Disp: , Rfl:   ???  gabapentin (NEURONTIN) 300 mg capsule, Take 300 mg by mouth every 8 hours., Disp: , Rfl:   ???  hydroCHLOROthiazide (HYDRODIURIL) 25 mg tablet, Take one tablet by mouth every morning., Disp: 90 tablet, Rfl: 3  ???  HYDROcodone/acetaminophen (NORCO) 5/325 mg tablet, Take 1 tablet by mouth every 4 hours as needed for Pain, Disp: , Rfl:   ???  levothyroxine (SYNTHROID) 25 mcg tablet, Take 25 mcg by mouth daily 30 minutes before breakfast., Disp: , Rfl:   ???  liraglutide(+) (VICTOZA) 0.6 mg/0.1 mL (18 mg/3 mL) pnij, Inject 0.6 mg under the skin daily., Disp: , Rfl:   ???  lisinopril (PRINIVIL; ZESTRIL) 10 mg tablet, Take 10 mg by mouth daily., Disp: , Rfl:   ???  methylPREDNIsolone (MEDROL DOSEPAK) 4 mg tablet,  Take medication as directed on package for 6 days. Take with food., Disp: 21 tablet, Rfl: 0  ???  ondansetron (ZOFRAN ODT) 4 mg rapid dissolve tablet, Dissolve one tablet by mouth every 8 hours as needed for Nausea or Vomiting. Place on tongue to disolve., Disp: 10 tablet, Rfl: 0  ???  oxyCODONE (ROXICODONE, OXY-IR) 5 mg tablet, Take one tablet by mouth every 4 hours as needed for Pain, Disp: 10 tablet, Rfl: 0  ???  pantoprazole DR (PROTONIX) 40 mg tablet, Take 40 mg by mouth at bedtime daily., Disp: , Rfl:   ???  potassium chloride SR (K-DUR) 20 mEq tablet, Take 20 mEq by mouth daily as needed. Take with a meal and a full glass of water. , Disp: , Rfl:   ???  prednisone (DELTASONE) 10 mg tablet, Take 2 tablets daily for 5 days, then 1 for 5 days, Disp: 15 tablet, Rfl: 0 ???  sodium chloride/aloe vera (AYR) nasal spray, Apply one spray to two sprays to each nostril as directed four times daily., Disp: 1 Bottle, Rfl: 0  ???  tiZANidine (ZANAFLEX) 4 mg tablet, Take 4 mg by mouth every 8 hours as needed., Disp: , Rfl:   ???  zolpidem (AMBIEN) 10 mg tablet, Take 10 mg by mouth at bedtime as needed for Sleep., Disp: , Rfl:     General    Vitals:    06/24/19 0911   BP: 123/68   Pulse: 69   Temp: 36.8 ???C (98.2 ???F)     General:  Well-developed, well-nourished  Communication and Voice:  Clear pitch and clarity, age appropriate    Head and Face  Inspection:  Normocephalic and atraumatic without masses or lesions  Palpation:  Facial skeleton intact without bony stepoffs, no sinus tenderness  Salivary Glands:  No masses or tenderness  Facial Strength:  Facial motility symmetric and full bilaterally             Left -  House-Brackman Grade 1/6             Right - House-Brackman Grade 1/6    ENT  External nose:  No scar or anatomic deformity  Internal Nose:  Septum intact and midline.  No edema, polyps, or rhinorrhea  Lips, Teeth, and gums:  Mucosa and teeth intact and viable  TMJ:  No pain to palpation with full mobility  Oral cavity/oropharynx:  No erythema or exudate with non-obstructive tonsils  Nasopharynx:  No masses or lesions with intact mucosa  Hypopharynx:  Intact mucosa without pooling of secretions  Larynx:  Full true vocal cord mobility without lesions or masses    Neck  Neck and Trachea:  Midline trachea without mass or lesion  Thyroid:  No mass or nodularity  Lymphatics:  No lymphadenopathy    Respiratory  Respiratory effort:  Equal inspiration & expiration without use of accessory muscles. No stridor    Cardiovascular  Peripheral Vascular:  Warm extremities with equal pulses    Eyes  Nystagmus: None  EOM: Equal extraocular motion bilaterally    Neuro/Psych/Balance  Orientation: Patient oriented to person, place, and time  Affect: Appropriate mood and affect Gait: Intact with no imbalance  Cranial nerves: II-XII are intact    Ear  External canal: Left - Canal is patent with intact skin                             Right - Canal is patent with intact  skin  Tympanic Membranes: Left - Clear and mobile                                            Right - Clear and mobile  Middle Ears: Left - Aerated, no effusion, no masses                          Right - Aerated, no effusion, no masses      Office Procedure    Nasal Endoscopy    Indications: 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 The following was observed:    Right Nasal Cavity and Paranasal Sinuses: hypertrophic inferior turbinate; no purulence or polyps    Left Nasal Cavity and Paranasal Sinuses: hypertrophic inferior turbinate but sinuses look great ;all cavities widely patent and sphenoid sinus widely patent wihtout evidence of recurrence of fungal ball.  No purulence or crusting        Septum: deviated to right; large nasal airway on left  Other:    The patient tolerated the procedure well.              Impression/Plan:    Thank you for allowing me to see your patient in consultation.  Based on my exam and review of the patients records, the following is my impression and plan:    1.Chronic rhinosinusitis    2.Chronic rhintiis    -her endoscopy looks great - no recurrence of fungal ball or evidence of infection.  She has a strong allergy history and has been getting 2x week allergy shots.  Her nasal congestion symtoms have been over the past couple of months and in the absence of infection, she should talk to her allergist about possibly slowing down on the escalation of therapy.          F/u: prn    Heloise Purpura, M.D.    Professor and Villa Herb, M.D. Chairman Department of Otolaryngology-Head and Neck Surgery  [employer]

## 2019-06-24 NOTE — Telephone Encounter
Spoke to pt. Informed pt of hospital requirement for COVID testing. Pt V/U. No further questions. Orders placed.

## 2019-06-24 NOTE — Telephone Encounter (Signed)
Pt called in anxious because she read on the Internet that she should be doing stuff at 28 weeks and feels like we are missing something such as meeting Science writer, hospital tours, etc. Due to [facility] appts have been spaced out or video/calls, etc.  Reassured Pt that she is on track and nothing needs to be done other than her GCT test that Dr. Deon Pilling ordered 04/28/19. Pt understood and had no further questions or concerns. Jacobo Forest, MA  06/24/2019, 11:37

## 2019-06-25 DIAGNOSIS — J343 Hypertrophy of nasal turbinates: Secondary | ICD-10-CM

## 2019-07-09 ENCOUNTER — Encounter (INDEPENDENT_AMBULATORY_CARE_PROVIDER_SITE_OTHER): Payer: Self-pay | Admitting: Obstetrics

## 2019-07-10 NOTE — Pre-Anesthesia Patient Instructions
GENERAL INFORMATION    Before you come to the hospital  ??? Make arrangements for a responsible adult to drive you home and stay with you for 24 hours following surgery.  ??? Bath/Shower Instructions  ??? Take a bath or shower with antibacterial soap the night before or the morning of your procedure. Use clean towels.  ??? Put on clean clothes after bath or shower.  Avoid using lotion and oils.  ??? Sleep on clean sheets if bath or shower is done the night before procedure.  ??? Leave money, credit cards, jewelry, and any other valuables at home. [facility] is not responsible for the loss or breakage of personal items.  ??? Remove nail polish, makeup and all jewelry (including piercings) before coming to the hospital.  ??? The morning of your procedure:  ??? brush your teeth and tongue  ??? do not smoke  ??? do not shave the area where you will have surgery    What to bring to the hospital  ??? ID/ Insurance Card  ??? Medical Device card  ??? Official documents for legal guardianship   ??? Copy of your Living Will, Advanced Directives, and/or Durable Power of Attorney   ??? Small bag with a few personal belongings  ??? Cases for glasses/hearing aids/contact lens (bring solutions for contacts)  ??? Dress in clean, loose, comfortable clothing     Eating or drinking before surgery  ??? Do not eat or drink anything after 11:00 p.m. the day before your procedure (including gum, mints, candy, or chewing tobacco) OR follow the specific instructions you were given by your Surgeon.  ??? You may have WATER ONLY up to 2 hours before arriving at the hospital.     Other instructions  Notify your surgeon if:  ??? there is a possibility that you are pregnant  ??? you become ill with a cough, fever, sore throat, nausea, vomiting or flu-like symptoms  ??? you have any open wounds/sores that are red, painful, draining, or are new since you last saw  the doctor  ??? you need to cancel your procedure ??? You will receive a call with your surgery arrival time from between 2:30pm and 4:30pm the last business day before your procedure.  If you do not receive a call, please call [phone] before 4:30pm or [phone] after 4:30pm.    Notify us at [facility]: [phone]  ??? if you need to cancel your procedure  ??? if you are going to be late    Arrival at the hospital    [facility]  [address]  [place], [place] [address]    ??? Park in the [employer], located directly across from the main entrance to the hospital.  ??? Valet parking is available  from 7 AM to 4 PM Monday through Friday.  ??? Enter through the ground floor main hospital entrance and check in at the Information Desk in the lobby.  ??? They will validate your parking ticket and direct you to the next location.  ??? If you are a woman between the ages of 30 and 6, and have not had a hysterectomy, you will be asked for a urine sample prior to surgery.  Please do not urinate before arriving in the Surgery Waiting Room.  Once there, check in and let the attendant know if you need to provide a sample.    Coronavirus (COVID19) Information  If you get sick with fever (100.61F/38C or higher),  cough, or have trouble breathing:  ? Call your primary care physician for questions or health needs  ? Tell your doctor about any recent travel and your symptoms.  ? Avoid contact with others.  ? Notify Designer, industrial/product.  For up to date information on the Coronavirus, visit the CDC website at DiningCalendar.de.    For the safety of all patients, visitors and staff as we work to contain COVID-19, we must dramatically restrict patient visitors.  Current Visitor Policy (05/29/19):    One visitor per patient per day.  Exceptions include:    Two parents/guardian for patients younger than 41.  End-of-life patients may be allowed additional support persons.  Visitors should check with the patient's nurse. Only cancer patients at their exam visit may have one visitor with them.  No visitors are allowed for cancer patients receiving treatment/infusion services.  This applies at all cancer center locations.  Restrictions still apply for patients who test positive for COVID-19 (no visitors).    Visitors will continue to be screened at all entrances.  They must be free of fever and symptoms to be in our facilities.  We ask visitors to follow these guidelines:  Wear a mask at all times.  Go directly to the nursing station in the unit you are visiting and do not linger in public areas.  Check in at the nursing station before going to the patient's room.  Maintain a physical distance of six feet from all others.  Follow elevator restrictions to four riding at a time - peak times are 6:30-7:30 a.m., noon and 6:30-7:30 p.m.  Be aware cafeteria peak times are 11 a.m. - 1 p.m.  Wash your hands frequently and cover your coughs and sneezes.

## 2019-07-10 NOTE — Progress Notes
[facility] triage call completed w/pt for surgery 07/21/19 w/Dr. Genene Churn. PMH, allergies and medications reviewed. Pt has hx of CAD & HTN, follows Dr. Mardella Layman cards at [facility]. Pt has Asthma, well controlled w/inhalers, denies CP, SOB, URI, able to climb 2 flights of stairs w/out CP but may be SOB. Pt hx includes DM, CAD, HTN, HLD, Hypothyroid and Migraines. Pt reports fasting BS is 118. Pt was seen in [facility], [facility], [facility] 09/12/18, last procedure 09/18/18 w/G.A., ASA 3, denies complications w/anesthesia. Labs in chart from 05/30/19. No PAC appt indicated at this time. Pre-op and medication instructions given. Visitor policy reviewed. Pt verbalizes understanding and copy emailed to pt.

## 2019-07-17 ENCOUNTER — Encounter: Payer: Self-pay | Admitting: Obstetrics & Gynecology

## 2019-07-17 ENCOUNTER — Ambulatory Visit (INDEPENDENT_AMBULATORY_CARE_PROVIDER_SITE_OTHER): Payer: Medicaid Other | Admitting: Obstetrics

## 2019-07-17 ENCOUNTER — Other Ambulatory Visit: Payer: Self-pay

## 2019-07-17 ENCOUNTER — Encounter (INDEPENDENT_AMBULATORY_CARE_PROVIDER_SITE_OTHER): Payer: Self-pay | Admitting: Obstetrics

## 2019-07-17 VITALS — BP 118/72 | Temp 97.5°F | Ht 68.0 in | Wt 190.0 lb

## 2019-07-17 DIAGNOSIS — O0993 Supervision of high risk pregnancy, unspecified, third trimester: Secondary | ICD-10-CM

## 2019-07-17 DIAGNOSIS — F1190 Opioid use, unspecified, uncomplicated: Secondary | ICD-10-CM

## 2019-07-17 DIAGNOSIS — Z141 Cystic fibrosis carrier: Secondary | ICD-10-CM

## 2019-07-17 DIAGNOSIS — E669 Obesity, unspecified: Secondary | ICD-10-CM

## 2019-07-17 DIAGNOSIS — Z833 Family history of diabetes mellitus: Secondary | ICD-10-CM

## 2019-07-17 DIAGNOSIS — O09292 Supervision of pregnancy with other poor reproductive or obstetric history, second trimester: Secondary | ICD-10-CM

## 2019-07-17 DIAGNOSIS — O09523 Supervision of elderly multigravida, third trimester: Secondary | ICD-10-CM

## 2019-07-17 DIAGNOSIS — F419 Anxiety disorder, unspecified: Secondary | ICD-10-CM

## 2019-07-17 DIAGNOSIS — O99343 Other mental disorders complicating pregnancy, third trimester: Secondary | ICD-10-CM

## 2019-07-17 DIAGNOSIS — O99212 Obesity complicating pregnancy, second trimester: Secondary | ICD-10-CM

## 2019-07-17 DIAGNOSIS — O99323 Drug use complicating pregnancy, third trimester: Secondary | ICD-10-CM

## 2019-07-17 DIAGNOSIS — Z8759 Personal history of other complications of pregnancy, childbirth and the puerperium: Secondary | ICD-10-CM

## 2019-07-17 DIAGNOSIS — Z9889 Other specified postprocedural states: Secondary | ICD-10-CM

## 2019-07-17 DIAGNOSIS — Z3A31 31 weeks gestation of pregnancy: Secondary | ICD-10-CM

## 2019-07-17 DIAGNOSIS — F1290 Cannabis use, unspecified, uncomplicated: Secondary | ICD-10-CM

## 2019-07-17 DIAGNOSIS — O99332 Smoking (tobacco) complicating pregnancy, second trimester: Secondary | ICD-10-CM

## 2019-07-17 DIAGNOSIS — F17210 Nicotine dependence, cigarettes, uncomplicated: Secondary | ICD-10-CM

## 2019-07-17 DIAGNOSIS — Z3490 Encounter for supervision of normal pregnancy, unspecified, unspecified trimester: Secondary | ICD-10-CM

## 2019-07-17 HISTORY — DX: Supervision of high risk pregnancy, unspecified, third trimester: O09.93

## 2019-07-17 HISTORY — DX: Supervision of elderly multigravida, third trimester: O09.523

## 2019-07-17 NOTE — Nursing Note (Signed)
Chief Complaint:   Chief Complaint            Routine Prenatal Visit         Functional Health Screen  Functional Health Screening:         BP 118/72 (Site: Right, Patient Position: Sitting, Cuff Size: Adult)   Temp 36.4 C (97.5 F) (Temporal)   Ht 1.727 m (5\' 8" )   Wt 86.2 kg (190 lb)   LMP 12/09/2018   Breastfeeding No   BMI 28.89 kg/m       Social History     Tobacco Use   Smoking Status Current Every Day Smoker   . Packs/day: 0.50   . Years: 14.00   . Pack years: 7.00   . Types: Cigarettes   Smokeless Tobacco Never Used     Patient Health Rating           Depression Screening  PHQ Questionnaire     Allergies:  Allergies   Allergen Reactions   . Penicillins      HIVES   . Propoxyphene      HIVES   . Sulfa (Sulfonamides)      HIVES   . Tramadol Swelling     Medication History  Reviewed for OTC medication and any new medications, provider will review medication history  Results through Enter/Edit  No results found for this or any previous visit (from the past 24 hour(s)).  POCT Results  Care Team  Patient Care Team:  Didden, Shanon Brow, MD as PCP - General (EXTERNAL)  Immunizations - last 24 hours     None        Jacobo Forest, [place]  07/17/2019, 10:10

## 2019-07-17 NOTE — Progress Notes (Signed)
Name: Chanique Duca MRN:  [id number]   Date: 07/17/2019 Age: 38 y.o. 08/26/1981          Subjective:   Veneda Melter Kuehnel is being seen today for her obstetrical visit.  She is at [redacted]w[redacted]d weeks gestation.       Objective:   Weight: 86.2 kg (190 lb)  BP (Non-Invasive): 118/72  # of fetuses: 1  Protein: Negative  Glucose: Negative  Ketones: Negative  Fundal height: 32  Preterm Labor: None  Fetal Movement: Present  Edema: Negative  FHR (1): 144            BP 118/72 (Site: Right, Patient Position: Sitting, Cuff Size: Adult)   Temp 36.4 C (97.5 F) (Temporal)   Ht 1.727 m (5\' 8" )   Wt 86.2 kg (190 lb)   LMP 12/09/2018   Breastfeeding No   BMI 28.89 kg/m       Lab Results   Component Value Date    SYPHILISQL Non-reactive 02/27/2019    HIV12 NONREACTIVE 12/25/2011    UCULT NO GROWTH AFTER 2 DAYS INCUBATION. 12/25/2011     Assessment:   Pregnancy [redacted]w[redacted]d weeks    ICD-10-CM    1. 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 Smoking (tobacco) complicating pregnancy, second trimester O99.332    3. History of pregnancy induced hypertension Z87.59    4. Anxiety F41.9    5. Hx of induced abortion Z98.890    6. Hx of spontaneous abortion, currently pregnant, second trimester O09.292    7. 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 High-risk pregnancy in third trimester O09.93    11. 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 Multigravida of advanced maternal age in third trimester O72.523      O Pos no antibodies, 11.4, Rub Imm., STDs neg., urine-E. coli- rep, GCT-ND- encouraged to get. 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  MFM report of 06/10/19 reviewed.   Plan:   Encouraged to get GCT. Sees MFM July 28.  RTO 2 weeks.     Christiane Ha, MD

## 2019-07-18 DIAGNOSIS — N946 Dysmenorrhea, unspecified: Principal | ICD-10-CM

## 2019-07-19 ENCOUNTER — Ambulatory Visit: Admit: 2019-07-19 | Discharge: 2019-07-20

## 2019-07-19 NOTE — Progress Notes
Patient arrived to [facility] for COVID-19 testing 07/19/19 @ 12:58pm. Patient identity confirmed via photo I.D. Nasopharyngeal procedure explained to the patient.   Nasopharyngeal swab completed left  Patient education provided given and instructed patient self isolate until contacted w/ results and further instructions.   Swab collected by [facility], [place].    Date symptoms began/reason for testing: Pre-op

## 2019-07-20 DIAGNOSIS — Z1159 Encounter for screening for other viral diseases: Secondary | ICD-10-CM

## 2019-07-20 DIAGNOSIS — Z01818 Encounter for other preprocedural examination: Secondary | ICD-10-CM

## 2019-07-20 LAB — COVID-19 (SARS-COV-2) PCR

## 2019-07-21 ENCOUNTER — Encounter: Admit: 2019-07-21 | Discharge: 2019-07-21

## 2019-07-21 DIAGNOSIS — K929 Disease of digestive system, unspecified: Secondary | ICD-10-CM

## 2019-07-21 DIAGNOSIS — G43909 Migraine, unspecified, not intractable, without status migrainosus: Secondary | ICD-10-CM

## 2019-07-21 DIAGNOSIS — B009 Herpesviral infection, unspecified: Secondary | ICD-10-CM

## 2019-07-21 DIAGNOSIS — J45909 Unspecified asthma, uncomplicated: Secondary | ICD-10-CM

## 2019-07-21 DIAGNOSIS — M1990 Unspecified osteoarthritis, unspecified site: Secondary | ICD-10-CM

## 2019-07-21 DIAGNOSIS — M549 Dorsalgia, unspecified: Secondary | ICD-10-CM

## 2019-07-21 DIAGNOSIS — E039 Hypothyroidism, unspecified: Secondary | ICD-10-CM

## 2019-07-21 DIAGNOSIS — J349 Unspecified disorder of nose and nasal sinuses: Secondary | ICD-10-CM

## 2019-07-21 DIAGNOSIS — I2510 Atherosclerotic heart disease of native coronary artery without angina pectoris: Secondary | ICD-10-CM

## 2019-07-21 DIAGNOSIS — F419 Anxiety disorder, unspecified: Secondary | ICD-10-CM

## 2019-07-21 DIAGNOSIS — E785 Hyperlipidemia, unspecified: Secondary | ICD-10-CM

## 2019-07-21 DIAGNOSIS — E119 Type 2 diabetes mellitus without complications: Secondary | ICD-10-CM

## 2019-07-21 DIAGNOSIS — F329 Major depressive disorder, single episode, unspecified: Secondary | ICD-10-CM

## 2019-07-21 DIAGNOSIS — I10 Essential (primary) hypertension: Secondary | ICD-10-CM

## 2019-07-21 LAB — CBC
Lab: 13 % (ref 11–15)
Lab: 253 10*3/uL — ABNORMAL LOW (ref >60)
Lab: 28 pg (ref 26–34)
Lab: 32 g/dL — ABNORMAL HIGH (ref 32.0–36.0)
Lab: 39 % (ref 36–45)
Lab: 7.2 10*3/uL (ref 4.5–11.0)
Lab: 8.8 FL (ref >60)
Lab: 85 FL — ABNORMAL HIGH (ref 80–100)

## 2019-07-21 LAB — BASIC METABOLIC PANEL: Lab: 139 MMOL/L (ref 137–147)

## 2019-07-21 LAB — PREGNANCY TEST-URINE
Lab: 1 g/dL (ref 12.0–15.0)
Lab: NEGATIVE M/UL (ref 4.0–5.0)

## 2019-07-21 LAB — POC GLUCOSE
Lab: 102 mg/dL — ABNORMAL HIGH (ref 70–100)
Lab: 105 mg/dL — ABNORMAL HIGH (ref 70–100)

## 2019-07-21 MED ORDER — SUCCINYLCHOLINE CHLORIDE 20 MG/ML IJ SOLN
INTRAVENOUS | 0 refills | Status: DC
Start: 2019-07-21 — End: 2019-07-21
  Administered 2019-07-21: 13:00:00 120 mg via INTRAVENOUS

## 2019-07-21 MED ORDER — GLYCOPYRROLATE 0.2 MG/ML IJ SOLN
0 refills | Status: DC
Start: 2019-07-21 — End: 2019-07-21
  Administered 2019-07-21: 13:00:00 0.2 mg via INTRAVENOUS

## 2019-07-21 MED ORDER — MORPHINE 4 MG/ML IV SYRG
2 mg | INTRAVENOUS | 0 refills | Status: DC | PRN
Start: 2019-07-21 — End: 2019-07-22

## 2019-07-21 MED ORDER — OXYCODONE 5 MG PO TAB
10 mg | Freq: Once | ORAL | 0 refills | Status: CP
Start: 2019-07-21 — End: ?
  Administered 2019-07-21: 12:00:00 10 mg via ORAL

## 2019-07-21 MED ORDER — SODIUM CHLORIDE 0.9 % IV SOLP
INTRAVENOUS | 0 refills | Status: DC
Start: 2019-07-21 — End: 2019-07-22
  Administered 2019-07-21: 16:00:00 1000.000 mL via INTRAVENOUS

## 2019-07-21 MED ORDER — CEFAZOLIN 1 GRAM IJ SOLR
0 refills | Status: DC
Start: 2019-07-21 — End: 2019-07-21
  Administered 2019-07-21: 13:00:00 3 g via INTRAVENOUS

## 2019-07-21 MED ORDER — HALOPERIDOL LACTATE 5 MG/ML IJ SOLN
1 mg | Freq: Once | INTRAVENOUS | 0 refills | Status: DC | PRN
Start: 2019-07-21 — End: 2019-07-22

## 2019-07-21 MED ORDER — ZOLPIDEM 5 MG PO TAB
10 mg | Freq: Every evening | ORAL | 0 refills | Status: DC | PRN
Start: 2019-07-21 — End: 2019-07-22
  Administered 2019-07-22: 02:00:00 10 mg via ORAL

## 2019-07-21 MED ORDER — FENTANYL CITRATE (PF) 50 MCG/ML IJ SOLN
0 refills | Status: DC
Start: 2019-07-21 — End: 2019-07-21
  Administered 2019-07-21: 13:00:00 100 ug via INTRAVENOUS

## 2019-07-21 MED ORDER — MELATONIN 5 MG PO TAB
5 mg | Freq: Every evening | ORAL | 0 refills | Status: DC | PRN
Start: 2019-07-21 — End: 2019-07-22

## 2019-07-21 MED ORDER — ALBUTEROL SULFATE 90 MCG/ACTUATION IN HFAA
2 | Freq: Two times a day (BID) | RESPIRATORY_TRACT | 0 refills | Status: DC | PRN
Start: 2019-07-21 — End: 2019-07-22
  Administered 2019-07-22: 01:00:00 2 via RESPIRATORY_TRACT

## 2019-07-21 MED ORDER — PROMETHAZINE 25 MG/ML IJ SOLN
6.25 mg | INTRAVENOUS | 0 refills | Status: DC | PRN
Start: 2019-07-21 — End: 2019-07-22

## 2019-07-21 MED ORDER — FAMOTIDINE 20 MG PO TAB
20 mg | Freq: Two times a day (BID) | ORAL | 0 refills | Status: DC
Start: 2019-07-21 — End: 2019-07-22
  Administered 2019-07-22 (×2): 20 mg via ORAL

## 2019-07-21 MED ORDER — ACETAMINOPHEN 325 MG PO TAB
650 mg | ORAL | 0 refills | Status: DC | PRN
Start: 2019-07-21 — End: 2019-07-22
  Administered 2019-07-21 – 2019-07-22 (×2): 650 mg via ORAL

## 2019-07-21 MED ORDER — ALBUTEROL SULFATE 2.5 MG /3 ML (0.083 %) IN NEBU
2.5 mg | RESPIRATORY_TRACT | 0 refills | Status: DC | PRN
Start: 2019-07-21 — End: 2019-07-22

## 2019-07-21 MED ORDER — ONDANSETRON HCL (PF) 4 MG/2 ML IJ SOLN
4 mg | INTRAVENOUS | 0 refills | Status: DC | PRN
Start: 2019-07-21 — End: 2019-07-22

## 2019-07-21 MED ORDER — DEXAMETHASONE SODIUM PHOSPHATE 4 MG/ML IJ SOLN
INTRAVENOUS | 0 refills | Status: DC
Start: 2019-07-21 — End: 2019-07-21
  Administered 2019-07-21: 13:00:00 4 mg via INTRAVENOUS

## 2019-07-21 MED ORDER — ALBUTEROL SULFATE 90 MCG/ACTUATION IN HFAA
0 refills | Status: DC
Start: 2019-07-21 — End: 2019-07-21
  Administered 2019-07-21: 13:00:00 4 via RESPIRATORY_TRACT

## 2019-07-21 MED ORDER — ROCURONIUM 10 MG/ML IV SOLN
INTRAVENOUS | 0 refills | Status: DC
Start: 2019-07-21 — End: 2019-07-21
  Administered 2019-07-21 (×2): 10 mg via INTRAVENOUS
  Administered 2019-07-21: 13:00:00 20 mg via INTRAVENOUS

## 2019-07-21 MED ORDER — HYDROMORPHONE (PF) 2 MG/ML IJ SYRG
.5 mg | INTRAVENOUS | 0 refills | Status: DC | PRN
Start: 2019-07-21 — End: 2019-07-22
  Administered 2019-07-21 (×4): 0.5 mg via INTRAVENOUS

## 2019-07-21 MED ORDER — DULOXETINE 30 MG PO CPDR
60 mg | Freq: Every evening | ORAL | 0 refills | Status: DC
Start: 2019-07-21 — End: 2019-07-22
  Administered 2019-07-22: 02:00:00 60 mg via ORAL

## 2019-07-21 MED ORDER — ONDANSETRON HCL (PF) 4 MG/2 ML IJ SOLN
INTRAVENOUS | 0 refills | Status: DC
Start: 2019-07-21 — End: 2019-07-21
  Administered 2019-07-21: 14:00:00 4 mg via INTRAVENOUS

## 2019-07-21 MED ORDER — FLUTICASONE PROPIONATE 220 MCG/ACTUATION IN HFAA
2 | Freq: Two times a day (BID) | RESPIRATORY_TRACT | 0 refills | Status: DC
Start: 2019-07-21 — End: 2019-07-22
  Administered 2019-07-22: 02:00:00 2 via RESPIRATORY_TRACT

## 2019-07-21 MED ORDER — GABAPENTIN 300 MG PO CAP
300 mg | Freq: Once | ORAL | 0 refills | Status: CP
Start: 2019-07-21 — End: ?
  Administered 2019-07-21: 12:00:00 300 mg via ORAL

## 2019-07-21 MED ORDER — IPRATROPIUM BROMIDE 0.02 % IN SOLN
0.5 mg | RESPIRATORY_TRACT | 0 refills | Status: DC | PRN
Start: 2019-07-21 — End: 2019-07-22

## 2019-07-21 MED ORDER — LIDOCAINE (PF) 10 MG/ML (1 %) IJ SOLN
.1-2 mL | INTRAMUSCULAR | 0 refills | Status: DC | PRN
Start: 2019-07-21 — End: 2019-07-21

## 2019-07-21 MED ORDER — ACETAMINOPHEN 500 MG PO TAB
1000 mg | Freq: Once | ORAL | 0 refills | Status: CP
Start: 2019-07-21 — End: ?
  Administered 2019-07-21: 12:00:00 1000 mg via ORAL

## 2019-07-21 MED ORDER — LACTATED RINGERS IV SOLP
1000 mL | INTRAVENOUS | 0 refills | Status: DC
Start: 2019-07-21 — End: 2019-07-21
  Administered 2019-07-21: 12:00:00 1000 mL via INTRAVENOUS
  Administered 2019-07-21: 15:00:00 1000.000 mL via INTRAVENOUS

## 2019-07-21 MED ORDER — MIDAZOLAM 1 MG/ML IJ SOLN
INTRAVENOUS | 0 refills | Status: DC
Start: 2019-07-21 — End: 2019-07-21
  Administered 2019-07-21: 12:00:00 2 mg via INTRAVENOUS

## 2019-07-21 MED ORDER — PROPOFOL INJ 10 MG/ML IV VIAL
0 refills | Status: DC
Start: 2019-07-21 — End: 2019-07-21
  Administered 2019-07-21: 13:00:00 200 mg via INTRAVENOUS

## 2019-07-21 MED ORDER — SENNOSIDES-DOCUSATE SODIUM 8.6-50 MG PO TAB
1 | Freq: Two times a day (BID) | ORAL | 0 refills | Status: DC
Start: 2019-07-21 — End: 2019-07-22
  Administered 2019-07-22 (×2): 1 via ORAL

## 2019-07-21 MED ORDER — LIDOCAINE (PF) 200 MG/10 ML (2 %) IJ SYRG
0 refills | Status: DC
Start: 2019-07-21 — End: 2019-07-21
  Administered 2019-07-21: 13:00:00 80 mg via INTRAVENOUS

## 2019-07-21 MED ORDER — KETOROLAC 15 MG/ML IJ SOLN
15 mg | INTRAVENOUS | 0 refills | Status: CP
Start: 2019-07-21 — End: ?
  Administered 2019-07-21 – 2019-07-22 (×4): 15 mg via INTRAVENOUS

## 2019-07-21 MED ORDER — OXYCODONE 5 MG PO TAB
5-10 mg | ORAL | 0 refills | Status: DC | PRN
Start: 2019-07-21 — End: 2019-07-22
  Administered 2019-07-21 – 2019-07-22 (×5): 10 mg via ORAL

## 2019-07-21 MED ORDER — SUGAMMADEX 100 MG/ML IV SOLN
INTRAVENOUS | 0 refills | Status: DC
Start: 2019-07-21 — End: 2019-07-21
  Administered 2019-07-21: 15:00:00 243 mg via INTRAVENOUS

## 2019-07-21 MED ORDER — DEXTRAN 70-HYPROMELLOSE (PF) 0.1-0.3 % OP DPET
0 refills | Status: DC
Start: 2019-07-21 — End: 2019-07-21
  Administered 2019-07-21: 13:00:00 2 [drp] via OPHTHALMIC

## 2019-07-21 MED ORDER — OXYCODONE 5 MG PO TAB
5-10 mg | Freq: Once | ORAL | 0 refills | Status: CP | PRN
Start: 2019-07-21 — End: ?
  Administered 2019-07-21: 16:00:00 10 mg via ORAL

## 2019-07-21 NOTE — Progress Notes
Patient arrived to room # [facility] via bed accompanied by RN. Patient transferred to the bed without assistance. Bedside safety checks completed. Initial patient assessment completed. Refer to flowsheet for details.    Admission skin assessment completed with: Deb May, RN    Pressure injury present on arrival?: No    1. Head/Face/Neck: No  2. Trunk/Back: No  3. Upper Extremities: No  4. Lower Extremities: No  5. Pelvic/Coccyx: No  6. Assessed for device associated injury? Yes  7. Malnutrition Screening Tool (Nursing Nutrition Assessment) Completed? Yes    See Doc Flowsheet for additional wound details.     INTERVENTIONS:

## 2019-07-21 NOTE — H&P (View-Only)
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 She is also aware of the risk of bowel/ureteral/bladder fistula.??? Other risks including infection, DVT/PE, wound separation/cellulitis/evisceration, pulmonary/cardiac complications or death were also discussed.??? She is also aware of the potential need for blood transfusion and risks associated with blood, including HIV, Hepatitis    -D/w Dr. Pearson Forster, MD  PGY4 Obstetrics and Gynecology  Please call the Gynecology pager [phone] with any questions or concerns. Thank you.  ______________________________________________________________________________  Per Dr Marcelino Freestone note on 06/13/19:    History of Present Illness:   Dorothy Todd is a 38 y.o.   37yo [phone] with h/o DMII and essential HTN who presents to discuss management of her periods.   ???  Reports that she had very heavy cycles since 2016.   Had a D&C done at the time for bleeding.   After the [facility] they performed an ablation.   Has had persistent cycles even after the ablation.   Reports regular very painful cycles every month until February. Has tried ibuprofen with minimal improvement.   Then didn't have a period and had a pos UPT   Was seen in [facility] last week and had negative HCG and normal ultrasound with [facility]>   ???  Has previously tried hormone therapy for her bleeding. Loestrin OCPs  Has hypertension so the options are limited.   ???  Also reports h/o abnormal paps.   In 2014 lived in [place] and had a few abnoraml paps and two colposcopies. Last pap was 2 years ago in [place] and was normal.   Was due for a pap last year, but hasnt gotten it.   ???  Has also had a history of ovarian cysts. The cysts come and go.    Review of Systems:  Otherwise negative except for as mentioned in HPI.      PmHx:   Medical History:   Diagnosis Date   ??? Anxiety    ??? Arthritis     Osteo   ??? Asthma    ??? Back pain    ??? Coronary artery disease    ??? Depression    ??? Diabetes (HCC)    ??? Gastrointestinal disorder     Well controlled   ??? Herpes    ??? HTN (hypertension)    ??? Hyperlipidemia    ??? Hypothyroidism    ??? Migraines    ??? Sinus problem     a mass of mold       PsHx:   Surgical History:   Procedure Laterality Date   ??? ENDOMETRIAL ABLATION  2016   ??? HX TONSILLECTOMY  2017   ??? ENDOSCOPY NASAL/ SINUS WITH ETHMOIDECTOMY AND SPHENOIDOTOMY/ TISSUE REMOVAL Left 09/18/2018    Performed by Neldon Newport, MD at [facility] OR   ??? ENDOSCOPY NASAL/ SINUS WITH EXCISION TISSUE MAXILLARY SINUS Left 09/18/2018    Performed by Neldon Newport, MD at [facility] OR   ??? STEREOTACTIC COMPUTER-ASSISTED  CRANIAL PROCEDURE - EXTRADURAL N/A 09/18/2018    Performed by Neldon Newport, MD at [facility] OR   ??? HX CESAREAN SECTION      GA   ??? HX TUBAL LIGATION         FmHx:   Family History   Problem Relation Age of Onset   ??? Diabetes Mother    ??? Hypertension Mother    ??? Diabetes Maternal Grandmother    ??? Heart Disease Maternal Grandmother    ??? Stroke Maternal Grandmother    ??? High Cholesterol Maternal Grandmother    ??? Diabetes Maternal Grandfather    ??? Diabetes Paternal Grandmother    ??? Cancer Paternal Grandmother         cervical   ??? High Cholesterol Paternal Grandmother    ??? Stroke Paternal Grandmother    ??? Heart Disease Paternal Grandmother    ??? Cervical Cancer Paternal Grandmother    ??? Diabetes Paternal Grandfather    ??? Hypertension Father    ??? Cancer Paternal Aunt         breast&stomach   ??? Cancer-Breast Paternal Aunt    ??? Stomach Cancer Paternal Aunt    ??? Cancer Paternal Aunt         breast&cervical ??? Cancer-Breast Paternal Aunt    ??? Cervical Cancer Paternal Aunt    ??? Cancer-Colon Neg Hx    ??? Cancer-Ovarian Neg Hx    ??? Cancer-Uterine Neg Hx        Social:   Social History     Socioeconomic History   ??? Marital status: Married     Spouse name: Not on file   ??? Number of children: Not on file   ??? Years of education: Not on file   ??? Highest education level: Not on file   Occupational History   ??? Not on file   Tobacco Use   ??? Smoking status: Never Smoker   ??? Smokeless tobacco: Never Used   Substance and Sexual Activity   ??? Alcohol use: Yes     Comment: occ.   ??? Drug use: No   ??? Sexual activity: Yes     Partners: Male     Birth control/protection: Surgical   Other Topics Concern   ??? Not on file   Social History Narrative   ??? Not on file       Allergies:    Cherry and Cherry flavor    Medications:  No current facility-administered medications on file prior to encounter.      Current Outpatient Medications on File Prior to Encounter   Medication Sig Dispense Refill   ??? acetaminophen (TYLENOL) 325 mg tablet Take two tablets by mouth every 4 hours as needed for Pain. (Patient not taking: Reported on 07/10/2019) 30 tablet 0   ??? atenolol (TENORMIN) 25 mg tablet Take 25 mg by mouth daily.     ??? atorvastatin (LIPITOR) 20 mg tablet Take 20 mg by mouth at bedtime daily.     ??? azelastine(+) (ASTELIN) 137 mcg (0.1 %) nasal spray Apply two sprays to each nostril as directed twice daily. Use in each nostril as directed 90 mL 3   ??? cetirizine (ZYRTEC) 10 mg tablet Take 10 mg by mouth daily as needed.     ??? diclofenac(+) (VOLTAREN) 1 % topical gel Apply two g topically to affected area three times daily as needed. 200 g 0   ??? duloxetine DR (CYMBALTA) 60 mg capsule Take 60 mg by mouth at bedtime  daily.     ??? fluticasone propionate (FLONASE) 50 mcg/actuation nasal spray Apply two sprays to each nostril as directed twice daily. Shake bottle gently before using. 96 g 3 ??? fluticasone propionate (FLOVENT HFA) 220 mcg/actuation inhaler Inhale 2 puffs by mouth into the lungs every 12 hours.     ??? furosemide (LASIX) 20 mg tablet Take 20 mg by mouth daily as needed.     ??? gabapentin (NEURONTIN) 300 mg capsule Take 300 mg by mouth every 8 hours.     ??? HYDROcodone/acetaminophen (NORCO) 5/325 mg tablet Take 1 tablet by mouth every 4 hours as needed for Pain     ??? levothyroxine (SYNTHROID) 25 mcg tablet Take 25 mcg by mouth daily 30 minutes before breakfast.     ??? liraglutide(+) (VICTOZA) 0.6 mg/0.1 mL (18 mg/3 mL) pnij Inject 0.6 mg under the skin daily.     ??? lisinopriL-hydrochlorothiazide (ZESTORETIC) 10-12.5 mg tablet Take 1 tablet by mouth every morning.     ??? methylPREDNIsolone (MEDROL DOSEPAK) 4 mg tablet Take medication as directed on package for 6 days. Take with food. (Patient not taking: Reported on 07/10/2019) 21 tablet 0   ??? ondansetron (ZOFRAN ODT) 4 mg rapid dissolve tablet Dissolve one tablet by mouth every 8 hours as needed for Nausea or Vomiting. Place on tongue to disolve. (Patient not taking: Reported on 07/10/2019) 10 tablet 0   ??? oxyCODONE (ROXICODONE, OXY-IR) 5 mg tablet Take one tablet by mouth every 4 hours as needed for Pain (Patient not taking: Reported on 07/10/2019) 10 tablet 0   ??? pantoprazole DR (PROTONIX) 40 mg tablet Take 40 mg by mouth at bedtime daily.     ??? potassium chloride SR (K-DUR) 20 mEq tablet Take 20 mEq by mouth daily as needed. Take with a meal and a full glass of water.      ??? prednisone (DELTASONE) 10 mg tablet Take 2 tablets daily for 5 days, then 1 for 5 days (Patient not taking: Reported on 07/10/2019) 15 tablet 0   ??? sodium chloride/aloe vera (AYR) nasal spray Apply one spray to two sprays to each nostril as directed four times daily. (Patient not taking: Reported on 07/10/2019) 1 Bottle 0   ??? tiZANidine (ZANAFLEX) 4 mg tablet Take 4 mg by mouth every 8 hours as needed.     ??? VENTOLIN HFA 90 mcg/actuation aerosol inhaler Inhale 2 puffs by mouth into the lungs every 6 hours as needed for Wheezing or Shortness of Breath. Shake well before use.     ??? zolpidem (AMBIEN) 10 mg tablet Take 10 mg by mouth at bedtime as needed for Sleep.         Physical Exam:  There were no vitals filed for this visit.    SSE: deferred to OR  SVE: deferred to OR    Lab/Radiology/Other Diagnostic Tests:    24-hour labs:  No results found for this visit on 07/21/19 (from the past 24 hour(s)).

## 2019-07-21 NOTE — Progress Notes
RT Adult Assessment Note    NAME:Dorothy Todd             MRN: [id number]             DOB:12/16/1981          AGE: 38 y.o.  ADMISSION DATE: 07/21/2019             DAYS ADMITTED: LOS: 0 days    RT Treatment Plan:  Protocol Plan: Medications  Albuterol: MDI BID & PRN;Nebulizer PRN  Ipratropium: Nebulizer PRN    Protocol Plan: Procedures  SpO2: Continuous (Document SpO2 result Qshift)(OSA)    Additional Comments:  Impressions of the patient: pt sitting in room on room air.  Intervention(s)/outcome(s): see RT treatment plan  Patient education that was completed: none at this time  Recommendations to the care team: continue RT protocol    Vital Signs:  Pulse: 87  RR: 16 PER MINUTE  SpO2: 95 %  O2 Device:    Liter Flow:    O2%:    Breath Sounds: Clear (Implies normal)  Respiratory Effort: Non-Labored

## 2019-07-21 NOTE — Anesthesia Post-Procedure Evaluation
Post-Anesthesia Evaluation    Name: Dorothy Todd      MRN: [id number]     DOB: 13-Nov-1981     Age: 38 y.o.     Sex: female   __________________________________________________________________________     Procedure Information     Anesthesia Start Date/Time:  07/21/19 0726    Procedures:       TOTAL LAPAROSCOPIC HYSTERECTOMY, BILATERAL SALPINGECTOMY (Bilateral ) - CASE LENGTH 2 HOURS, REQUEST LIGASURE, REQUEST 1ST START      CYSTOURETHROSCOPY (N/A )    Location:  MAIN OR 53 / Main OR/Periop    Surgeon:  Darnelle Bos, MD          Post-Anesthesia Vitals  BP: 108/60 (07/27 1345)  Pulse: 74 (07/27 1345)  Respirations: 13 PER MINUTE (07/27 1345)  SpO2: 94 % (07/27 1345)  SpO2 Pulse: 71 (07/27 1345)   Vitals Value Taken Time   BP 108/60 07/21/2019  1:45 PM   Temp     Pulse 74 07/21/2019  1:45 PM   Respirations 13 PER MINUTE 07/21/2019  1:45 PM   SpO2 94 % 07/21/2019  1:45 PM         Post Anesthesia Evaluation Note    Evaluation location: Pre/Post  Patient participation: recovered; patient participated in evaluation  Level of consciousness: alert    Pain score: 5  Pain management: adequate    Hydration: normovolemia  Temperature: 36.0C - 38.4C  Airway patency: adequate    Perioperative Events       Post-op nausea and vomiting: no PONV    Postoperative Status  Cardiovascular status: hemodynamically stable  Respiratory status: spontaneous ventilation  Follow-up needed: none        Perioperative Events  Perioperative Event: No  Emergency Case Activation: No

## 2019-07-21 NOTE — Anesthesia Pre-Procedure Evaluation
Anesthesia Pre-Procedure Evaluation    Name: Dorothy Todd      MRN: [id number]     DOB: 1981/09/11     Age: 38 y.o.     Sex: female   _________________________________________________________________________     Procedure Info:   Procedure Information     Date/Time:  07/21/19 0730    Procedures:       TOTAL LAPAROSCOPIC HYSTERECTOMY, BILATERAL SALPINGECTOMY (Bilateral ) - CASE LENGTH 2 HOURS, REQUEST LIGASURE, REQUEST 1ST START      CYSTOURETHROSCOPY (N/A )    Location:  MAIN OR [facility] / Main OR/Periop    Surgeon:  Vear Clock, MD          Physical Assessment  Vital Signs (last filed in past 24 hours):  BP: 127/66 (07/27 0619)  Temp: 36.9 ???C (98.4 ???F) (07/27 4540)  Pulse: 74 (07/27 0619)  Respirations: 10 PER MINUTE (07/27 0619)  SpO2: 100 % (07/27 0619)  Height: 167.6 cm (66) (07/27 0619)  Weight: 121.4 kg (267 lb 9.6 oz) (07/27 9811)      Patient History   Allergies   Allergen Reactions   ??? Cherry HIVES   ??? Cherry Flavor HIVES        Current Medications    Medication Directions   acetaminophen (TYLENOL) 325 mg tablet Take two tablets by mouth every 4 hours as needed for Pain.  Patient not taking: Reported on 07/10/2019   atenolol (TENORMIN) 25 mg tablet Take 25 mg by mouth daily.   atorvastatin (LIPITOR) 20 mg tablet Take 20 mg by mouth at bedtime daily.   azelastine(+) (ASTELIN) 137 mcg (0.1 %) nasal spray Apply two sprays to each nostril as directed twice daily. Use in each nostril as directed   cetirizine (ZYRTEC) 10 mg tablet Take 10 mg by mouth daily as needed.   diclofenac(+) (VOLTAREN) 1 % topical gel Apply two g topically to affected area three times daily as needed.   duloxetine DR (CYMBALTA) 60 mg capsule Take 60 mg by mouth at bedtime daily.   fluticasone propionate (FLONASE) 50 mcg/actuation nasal spray Apply two sprays to each nostril as directed twice daily. Shake bottle gently before using.   fluticasone propionate (FLOVENT HFA) 220 mcg/actuation inhaler Inhale 2 puffs by mouth into the lungs every 12 hours.   furosemide (LASIX) 20 mg tablet Take 20 mg by mouth daily as needed.   gabapentin (NEURONTIN) 300 mg capsule Take 300 mg by mouth every 8 hours.   HYDROcodone/acetaminophen (NORCO) 5/325 mg tablet Take 1 tablet by mouth every 4 hours as needed for Pain   levothyroxine (SYNTHROID) 25 mcg tablet Take 25 mcg by mouth daily 30 minutes before breakfast.   liraglutide(+) (VICTOZA) 0.6 mg/0.1 mL (18 mg/3 mL) pnij Inject 0.6 mg under the skin daily.   lisinopriL-hydrochlorothiazide (ZESTORETIC) 10-12.5 mg tablet Take 1 tablet by mouth every morning.   methylPREDNIsolone (MEDROL DOSEPAK) 4 mg tablet Take medication as directed on package for 6 days. Take with food.  Patient not taking: Reported on 07/10/2019   ondansetron (ZOFRAN ODT) 4 mg rapid dissolve tablet Dissolve one tablet by mouth every 8 hours as needed for Nausea or Vomiting. Place on tongue to disolve.  Patient not taking: Reported on 07/10/2019   oxyCODONE (ROXICODONE, OXY-IR) 5 mg tablet Take one tablet by mouth every 4 hours as needed for Pain  Patient not taking: Reported on 07/10/2019   pantoprazole DR (PROTONIX) 40 mg tablet Take 40 mg by mouth at bedtime daily.   potassium  chloride SR (K-DUR) 20 mEq tablet Take 20 mEq by mouth daily as needed. Take with a meal and a full glass of water.    prednisone (DELTASONE) 10 mg tablet Take 2 tablets daily for 5 days, then 1 for 5 days  Patient not taking: Reported on 07/10/2019   sodium chloride/aloe vera (AYR) nasal spray Apply one spray to two sprays to each nostril as directed four times daily.  Patient not taking: Reported on 07/10/2019   tiZANidine (ZANAFLEX) 4 mg tablet Take 4 mg by mouth every 8 hours as needed.   VENTOLIN HFA 90 mcg/actuation aerosol inhaler Inhale 2 puffs by mouth into the lungs every 6 hours as needed for Wheezing or Shortness of Breath. Shake well before use.   zolpidem (AMBIEN) 10 mg tablet Take 10 mg by mouth at bedtime as needed for Sleep. Review of Systems/Medical History      Patient summary reviewed  Pertinent labs reviewed    PONV Screening: Female gender, Non-smoker and Postoperative opioids  No history of anesthetic complications  No family history of anesthetic complications      Airway - negative        Pulmonary       Not a current smoker        Asthma      Cardiovascular         Exercise tolerance: >4 METS       Beta Blocker therapy: Yes (Occasional use of atenolol for HR management. Last taken 1 wk ago.)      Beta blockers within 24 hours: No        Hypertension (Lisinopril/HCTZ- not taken this am), well controlled      Coronary artery disease      Hyperlipidemia      GI/Hepatic/Renal           GERD, well controlled      Neuro/Psych         Headaches (Hx of migraines)        Psychiatric history          Depression      Musculoskeletal         Back pain      Arthritis      Endocrine/Other       Diabetes, well controlled, type 2; using insulin      Hypothyroidism   PHYSICAL EXAM     Diagnostic Tests  Hematology:   Lab Results   Component Value Date    HGB 12.8 07/21/2019    HCT 39.1 07/21/2019    PLTCT 253 07/21/2019    WBC 7.2 07/21/2019    NEUT 86 08/15/2018    ANC 7.60 08/15/2018    ALC 0.90 08/15/2018    MONA 4 08/15/2018    AMC 0.30 08/15/2018    EOSA 0 08/15/2018    ABC 0.00 08/15/2018    MCV 85.2 07/21/2019    MCH 28.0 07/21/2019    MCHC 32.8 07/21/2019    MPV 8.8 07/21/2019    RDW 13.4 07/21/2019         General Chemistry:   Lab Results   Component Value Date    NA 141 05/30/2019    K 3.5 05/30/2019    CL 108 05/30/2019    CO2 25 05/30/2019    GAP 8 05/30/2019    BUN 12 05/30/2019    CR 1.03 05/30/2019    GLU 77 05/30/2019    CA 9.2 05/30/2019    ALBUMIN 4.1 05/30/2019  TOTBILI 0.4 05/30/2019      Coagulation: No results found for: PT, PTT, INR      Anesthesia Plan    ASA score: 3   Plan: general and regional for postoperative pain  Induction method: intravenous  NPO status: acceptable      Informed Consent Anesthetic plan and risks discussed with patient.  Use of blood products discussed with patient  Blood Consent: consented      Plan discussed with: anesthesiologist and CRNA.  Comments: (Will plan a general anesthetic with ETT. Pt consented for possible a-line and regional anesthesia if needed. Pt amendable to plan and all questions answered.)

## 2019-07-21 NOTE — Addendum Note (Signed)
Addended by: Jacobo Forest on: 07/21/2019 10:38 AM     Modules accepted: Orders

## 2019-07-22 ENCOUNTER — Ambulatory Visit (INDEPENDENT_AMBULATORY_CARE_PROVIDER_SITE_OTHER)
Admission: RE | Admit: 2019-07-22 | Discharge: 2019-07-22 | Disposition: A | Discharge status: Home / Self Care | Payer: No Typology Code available for payment source | Source: Ambulatory Visit

## 2019-07-22 ENCOUNTER — Encounter: Admit: 2019-07-21 | Discharge: 2019-07-21 | Payer: PRIVATE HEALTH INSURANCE

## 2019-07-22 ENCOUNTER — Encounter: Admit: 2019-07-21 | Discharge: 2019-07-22

## 2019-07-22 ENCOUNTER — Encounter: Admit: 2019-07-22 | Discharge: 2019-07-22

## 2019-07-22 ENCOUNTER — Encounter: Admit: 2019-07-18 | Discharge: 2019-07-18 | Payer: PRIVATE HEALTH INSURANCE

## 2019-07-22 ENCOUNTER — Ambulatory Visit: Payer: Medicaid Other | Attending: Obstetrics

## 2019-07-22 ENCOUNTER — Other Ambulatory Visit: Payer: Self-pay

## 2019-07-22 DIAGNOSIS — Z833 Family history of diabetes mellitus: Secondary | ICD-10-CM | POA: Insufficient documentation

## 2019-07-22 DIAGNOSIS — Z3490 Encounter for supervision of normal pregnancy, unspecified, unspecified trimester: Secondary | ICD-10-CM

## 2019-07-22 DIAGNOSIS — Z8049 Family history of malignant neoplasm of other genital organs: Secondary | ICD-10-CM

## 2019-07-22 DIAGNOSIS — I10 Essential (primary) hypertension: Secondary | ICD-10-CM

## 2019-07-22 DIAGNOSIS — Z9889 Other specified postprocedural states: Secondary | ICD-10-CM

## 2019-07-22 DIAGNOSIS — N939 Abnormal uterine and vaginal bleeding, unspecified: Secondary | ICD-10-CM

## 2019-07-22 DIAGNOSIS — F329 Major depressive disorder, single episode, unspecified: Secondary | ICD-10-CM

## 2019-07-22 DIAGNOSIS — Z7951 Long term (current) use of inhaled steroids: Secondary | ICD-10-CM

## 2019-07-22 DIAGNOSIS — Z800 Family history of malignant neoplasm of digestive organs: Secondary | ICD-10-CM

## 2019-07-22 DIAGNOSIS — E785 Hyperlipidemia, unspecified: Secondary | ICD-10-CM

## 2019-07-22 DIAGNOSIS — N841 Polyp of cervix uteri: Secondary | ICD-10-CM

## 2019-07-22 DIAGNOSIS — Z79899 Other long term (current) drug therapy: Secondary | ICD-10-CM

## 2019-07-22 DIAGNOSIS — F419 Anxiety disorder, unspecified: Secondary | ICD-10-CM

## 2019-07-22 DIAGNOSIS — R300 Dysuria: Secondary | ICD-10-CM

## 2019-07-22 DIAGNOSIS — N838 Other noninflammatory disorders of ovary, fallopian tube and broad ligament: Secondary | ICD-10-CM

## 2019-07-22 DIAGNOSIS — J45909 Unspecified asthma, uncomplicated: Secondary | ICD-10-CM

## 2019-07-22 DIAGNOSIS — Z791 Long term (current) use of non-steroidal anti-inflammatories (NSAID): Secondary | ICD-10-CM

## 2019-07-22 DIAGNOSIS — E119 Type 2 diabetes mellitus without complications: Secondary | ICD-10-CM

## 2019-07-22 DIAGNOSIS — M7989 Other specified soft tissue disorders: Secondary | ICD-10-CM

## 2019-07-22 DIAGNOSIS — N9985 Post endometrial ablation syndrome: Secondary | ICD-10-CM

## 2019-07-22 DIAGNOSIS — Z803 Family history of malignant neoplasm of breast: Secondary | ICD-10-CM

## 2019-07-22 DIAGNOSIS — I2510 Atherosclerotic heart disease of native coronary artery without angina pectoris: Secondary | ICD-10-CM

## 2019-07-22 DIAGNOSIS — E039 Hypothyroidism, unspecified: Secondary | ICD-10-CM

## 2019-07-22 LAB — URINALYSIS DIPSTICK REFLEX TO CULTURE: Lab: NEGATIVE

## 2019-07-22 LAB — URINALYSIS MICROSCOPIC REFLEX TO CULTURE

## 2019-07-22 LAB — CBC: Lab: 10 10*3/uL — ABNORMAL LOW (ref >60)

## 2019-07-22 LAB — GLUCOSE TOLERANCE TEST (GTT), 1 HOUR
GLUCOLA LOT NUMBER: 876312
GLUCOSE 1 HR POST DOSE: 106 mg/dL (ref 70–134)
VOLUME OF GLUCOLA GIVEN: 10 oz
WEIGHT OF DEXTROSE IN THE GLUCOLA GIVEN: 50 g

## 2019-07-22 MED ORDER — IBUPROFEN 600 MG PO TAB
600 mg | ORAL_TABLET | ORAL | 0 refills | Status: AC | PRN
Start: 2019-07-22 — End: ?

## 2019-07-22 MED ORDER — OXYCODONE 5 MG PO TAB
5-10 mg | ORAL_TABLET | ORAL | 0 refills | 6.00000 days | Status: AC | PRN
Start: 2019-07-22 — End: ?

## 2019-07-22 NOTE — Progress Notes
RN entered patients room to administer pain medication. Patient stated she does not think that she is urinating enough, but that she also communicated this to the provider who rounded on her this morning. RN assisted patient up to the bathroom where she voided 373mL. A post void bladder scan was done and showed that the patient had 154mL. Text page sent to GYN pager and communicated to day RN Maggie.

## 2019-07-22 NOTE — Care Plan
Problem: Infection, Risk of, Urinary Catheter-Associated Urinary Tract Infection  Goal: Absence of urinary catheter-associated infection  Outcome: Goal Achieved     Problem: Respiratory Impairment (Non-Ventilated Patient)  Goal: Effective gas exchange  Outcome: Goal Achieved     Problem: Pain  Goal: Management of pain  Outcome: Goal Achieved  Goal: Knowledge of pain management  Outcome: Goal Achieved  Goal: Progress Toward Pain Management Goals  Outcome: Goal Achieved     Problem: Discharge Planning  Goal: Participation in plan of care  Outcome: Goal Achieved  Goal: Knowledge regarding plan of care  Outcome: Goal Achieved  Goal: Prepared for discharge  Outcome: Goal Achieved

## 2019-07-22 NOTE — Progress Notes
RT Adult Assessment Note    NAME:Dorothy Todd             MRN: [id number]             DOB:January 15, 1981          AGE: 38 y.o.  ADMISSION DATE: 07/21/2019             DAYS ADMITTED: LOS: 0 days    RT Treatment Plan:  Protocol Plan: Medications  Albuterol: MDI BID & PRN  Ipratropium: Discontinued  Flovent (Home regimen only): BID    Protocol Plan: Procedures  PEP Therapy: Place a nursing order for "IS Q1h While Awake" for any of Lung Expansion indicators  Oxygen/Humidity: O2 to keep SpO2 > 95%, if less than 24 hours post op, then keep SpO2 greater than 92%  SpO2: BID & PRN;Continuous (Document SpO2 result Qshift)    Additional Comments:  Impressions of the patient: Patient is resting comfortably on room air, no respiratory complaints or distress  Intervention(s)/outcome(s): Flovent and Albuterol BID, pulse oximetry  Patient education that was completed: Need for c.p.oximetry monitoring during sleep for OSA hx  Recommendations to the care team: Encourage IS therapy    Vital Signs:  Pulse: 77  RR: 16 PER MINUTE  SpO2: 98 %  O2 Device:    Liter Flow:    O2%:    Breath Sounds: Clear (Implies normal)  Respiratory Effort: Non-Labored

## 2019-07-22 NOTE — Progress Notes
30 Educated patient on discharge instructions. Patient verbalized understanding. Patient discharged to main lobby via wheelchair with all belongings at 1336

## 2019-07-22 NOTE — Progress Notes
Gynecology Progress Note        Subjective:  Patient is without complaints. Tolerating ambulation and regular diet without nausea or vomiting. Pt is voiding. Some burning with urination. Pain is well controlled on oral pain medications. Pt denies fever or chills, SOB or CP.    Objective:  Patient Vitals for the past 24 hrs:   BP Temp Pulse SpO2   07/22/19 0624 ??? ??? 78 97 %   07/22/19 0435 112/53 36.7 ???C (98.1 ???F) 76 96 %   07/22/19 0038 ??? ??? 80 97 %   07/21/19 2342 113/59 36.8 ???C (98.2 ???F) 83 98 %   07/21/19 2124 ??? ??? 78 100 %   07/21/19 2022 ??? ??? 77 98 %   07/21/19 1957 110/64 36.6 ???C (97.9 ???F) 76 100 %   07/21/19 1857 ??? ??? ??? 95 %   07/21/19 1820 ??? ??? 87 96 %   07/21/19 1530 126/82 37.2 ???C (98.9 ???F) 87 97 %   07/21/19 1430 114/56 ??? 67 96 %   07/21/19 1415 118/70 ??? 70 94 %   07/21/19 1400 111/61 ??? 72 95 %   07/21/19 1345 108/60 ??? 74 94 %   07/21/19 1330 116/62 ??? 72 94 %   07/21/19 1315 100/81 ??? 64 96 %   07/21/19 1300 124/62 ??? 69 94 %   07/21/19 1245 99/54 ??? 84 93 %   07/21/19 1230 126/66 ??? 69 95 %   07/21/19 1215 115/64 ??? 69 92 %   07/21/19 1200 118/61 ??? 77 92 %   07/21/19 1145 124/62 ??? 75 97 %   07/21/19 1130 118/63 ??? 66 99 %   07/21/19 1115 119/61 ??? 76 97 %   07/21/19 1100 117/65 ??? 75 96 %   07/21/19 1045 129/57 ??? 87 93 %   07/21/19 1030 131/63 ??? 83 93 %   07/21/19 1015 118/58 ??? 85 98 %   07/21/19 1000 122/49 36.4 ???C (97.5 ???F) 92 100 %       Physical Exam:    General:  No acute distress.  Heart:  Regular rate and rhythm.  Lungs: Clear to auscultation bilaterally.  Abdomen:  Soft, appropriately tender to palpation.  Non-distended. Positive bowel sounds. Incisions c/d/i  Extremities: No edema BL LE.  NTTP.    Lab Review:  24-hour labs:    Results for orders placed or performed during the hospital encounter of 07/21/19 (from the past 24 hour(s))   POC GLUCOSE    Collection Time: 07/21/19 10:45 AM   Result Value Ref Range    Glucose, POC 105 (H) 70 - 100 MG/DL   CBC    Collection Time: 07/22/19  6:11 AM Result Value Ref Range    White Blood Cells 10.4 4.5 - 11.0 K/UL    RBC 4.40 4.0 - 5.0 M/UL    Hemoglobin 12.5 12.0 - 15.0 GM/DL    Hematocrit 40.9 36 - 45 %    MCV 85.9 80 - 100 FL    MCH 28.4 26 - 34 PG    MCHC 33.0 32.0 - 36.0 G/DL    RDW 81.1 11 - 15 %    Platelet Count 241 150 - 400 K/UL    MPV 8.9 7 - 11 FL       Assessment:    38 y.o. F POD#1 s/p TLH BS cysto      Plan:    -Continue Routine Post-op care  -Regular diet  -Encourage ambulation   -Dysuria  likely 2/2 to recent foley -- will check UA w/ reflex to cx   -SCDs for DVT ppx  -Plan for discharge today, f/u in 6 weeks with Dr. Tasia Catchings     D/w Dr. Pearson Forster, MD  PGY-4 Obstetrics and Gynecology  Please page the Gynecology pager at [phone] with any questions or concerns.  Thank you.

## 2019-07-22 NOTE — Progress Notes
1940: Patient arrived to unit via bed by transport. Patient alert and oriented x4. Patient is requesting a sleeping medication for this evening as well as would like to take a shower. Text page sent to GYN pager. Will continue to monitor.

## 2019-07-22 NOTE — Progress Notes
Patient arrived to room # 5628 via bed accompanied by transport.Bedside safety checks completed. Initial patient assessment completed. Refer to flowsheet for details.    Admission skin assessment completed with: [place]. Knight, Rn    Pressure injury present on arrival?: No    1. Head/Face/Neck: No  2. Trunk/Back: No  3. Upper Extremities: No  4. Lower Extremities: No  5. Pelvic/Coccyx: No  6. Assessed for device associated injury? Yes  7. Malnutrition Screening Tool (Nursing Nutrition Assessment) Completed? Yes    See Doc Flowsheet for additional wound details.     INTERVENTIONS: No pressure ulcers present at this time.

## 2019-07-22 NOTE — Discharge Instructions - Appointments
Please follow up with Dr.Ahmed in 6 weeks

## 2019-07-23 ENCOUNTER — Encounter: Admit: 2019-07-23 | Discharge: 2019-07-23

## 2019-07-23 DIAGNOSIS — B009 Herpesviral infection, unspecified: Secondary | ICD-10-CM

## 2019-07-23 DIAGNOSIS — K929 Disease of digestive system, unspecified: Secondary | ICD-10-CM

## 2019-07-23 DIAGNOSIS — I2510 Atherosclerotic heart disease of native coronary artery without angina pectoris: Secondary | ICD-10-CM

## 2019-07-23 DIAGNOSIS — E785 Hyperlipidemia, unspecified: Secondary | ICD-10-CM

## 2019-07-23 DIAGNOSIS — M549 Dorsalgia, unspecified: Secondary | ICD-10-CM

## 2019-07-23 DIAGNOSIS — J45909 Unspecified asthma, uncomplicated: Secondary | ICD-10-CM

## 2019-07-23 DIAGNOSIS — J349 Unspecified disorder of nose and nasal sinuses: Secondary | ICD-10-CM

## 2019-07-23 DIAGNOSIS — M1990 Unspecified osteoarthritis, unspecified site: Secondary | ICD-10-CM

## 2019-07-23 DIAGNOSIS — F329 Major depressive disorder, single episode, unspecified: Secondary | ICD-10-CM

## 2019-07-23 DIAGNOSIS — E039 Hypothyroidism, unspecified: Secondary | ICD-10-CM

## 2019-07-23 DIAGNOSIS — E119 Type 2 diabetes mellitus without complications: Secondary | ICD-10-CM

## 2019-07-23 DIAGNOSIS — I10 Essential (primary) hypertension: Secondary | ICD-10-CM

## 2019-07-23 DIAGNOSIS — G43909 Migraine, unspecified, not intractable, without status migrainosus: Secondary | ICD-10-CM

## 2019-07-23 DIAGNOSIS — F419 Anxiety disorder, unspecified: Secondary | ICD-10-CM

## 2019-07-23 LAB — HGA1C (HEMOGLOBIN A1C WITH EST AVG GLUCOSE)
ESTIMATED AVERAGE GLUCOSE: 126 mg/dL
HEMOGLOBIN A1C: 6 % — ABNORMAL HIGH (ref 4.0–5.6)

## 2019-07-23 NOTE — Progress Notes
I have reviewed the notes, assessment, and/or procedures performed by Julie Utz, RN and concur with her/his documentation unless otherwise noted.

## 2019-07-31 ENCOUNTER — Ambulatory Visit (INDEPENDENT_AMBULATORY_CARE_PROVIDER_SITE_OTHER): Payer: Medicaid Other | Admitting: Obstetrics

## 2019-07-31 ENCOUNTER — Other Ambulatory Visit: Payer: Medicaid Other | Attending: Obstetrics

## 2019-07-31 ENCOUNTER — Other Ambulatory Visit: Payer: Self-pay

## 2019-07-31 ENCOUNTER — Other Ambulatory Visit (INDEPENDENT_AMBULATORY_CARE_PROVIDER_SITE_OTHER): Payer: Self-pay | Admitting: Obstetrics

## 2019-07-31 ENCOUNTER — Encounter (INDEPENDENT_AMBULATORY_CARE_PROVIDER_SITE_OTHER): Payer: Self-pay | Admitting: Obstetrics

## 2019-07-31 VITALS — BP 138/80 | HR 74 | Ht 68.0 in | Wt 192.2 lb

## 2019-07-31 DIAGNOSIS — O24419 Gestational diabetes mellitus in pregnancy, unspecified control: Secondary | ICD-10-CM

## 2019-07-31 DIAGNOSIS — Z833 Family history of diabetes mellitus: Secondary | ICD-10-CM

## 2019-07-31 DIAGNOSIS — Z9889 Other specified postprocedural states: Secondary | ICD-10-CM

## 2019-07-31 DIAGNOSIS — R569 Unspecified convulsions: Secondary | ICD-10-CM

## 2019-07-31 DIAGNOSIS — F1190 Opioid use, unspecified, uncomplicated: Secondary | ICD-10-CM

## 2019-07-31 DIAGNOSIS — O99320 Drug use complicating pregnancy, unspecified trimester: Secondary | ICD-10-CM

## 2019-07-31 DIAGNOSIS — F1290 Cannabis use, unspecified, uncomplicated: Secondary | ICD-10-CM

## 2019-07-31 DIAGNOSIS — F419 Anxiety disorder, unspecified: Secondary | ICD-10-CM

## 2019-07-31 DIAGNOSIS — O0993 Supervision of high risk pregnancy, unspecified, third trimester: Secondary | ICD-10-CM

## 2019-07-31 DIAGNOSIS — Z8759 Personal history of other complications of pregnancy, childbirth and the puerperium: Secondary | ICD-10-CM

## 2019-07-31 DIAGNOSIS — Z87440 Personal history of urinary (tract) infections: Secondary | ICD-10-CM

## 2019-07-31 DIAGNOSIS — Z141 Cystic fibrosis carrier: Secondary | ICD-10-CM

## 2019-07-31 DIAGNOSIS — O99213 Obesity complicating pregnancy, third trimester: Secondary | ICD-10-CM

## 2019-07-31 DIAGNOSIS — O99332 Smoking (tobacco) complicating pregnancy, second trimester: Secondary | ICD-10-CM

## 2019-07-31 DIAGNOSIS — Z3A33 33 weeks gestation of pregnancy: Secondary | ICD-10-CM

## 2019-07-31 DIAGNOSIS — O09293 Supervision of pregnancy with other poor reproductive or obstetric history, third trimester: Secondary | ICD-10-CM

## 2019-07-31 DIAGNOSIS — O09523 Supervision of elderly multigravida, third trimester: Secondary | ICD-10-CM

## 2019-07-31 MED ORDER — BLOOD-GLUCOSE METER
0 refills | Status: DC
Start: 2019-07-31 — End: 2019-09-01

## 2019-07-31 MED ORDER — LANCETS
1.0000 [IU] | Freq: Four times a day (QID) | 3 refills | Status: DC
Start: 2019-07-31 — End: 2019-09-01

## 2019-07-31 MED ORDER — BLOOD-GLUCOSE METER
1.0000 [IU] | Freq: Four times a day (QID) | 0 refills | Status: DC
Start: 2019-07-31 — End: 2019-09-01

## 2019-07-31 MED ORDER — LANCETS 26 GAUGE
4 refills | Status: DC
Start: 2019-07-31 — End: 2019-09-01

## 2019-07-31 MED ORDER — BLOOD SUGAR DIAGNOSTIC STRIPS
1.00 | ORAL_STRIP | Freq: Four times a day (QID) | 4 refills | Status: DC
Start: 2019-07-31 — End: 2019-09-01

## 2019-07-31 NOTE — Nursing Note (Signed)
07/31/19 1300   Urine test  ([employer] Multistix 10 SG)   Time collected 1332   Color Yellow   Clarity Clear   Glucose Negative   Bilirubin Negative   Ketones Negative   Urine Specific Gravity 1.020   Blood (urine) Negative   pH 7.0   Protein Negative   Urobilinogen 0.2mg /dL (Normal)   Nitrite Negative   Leukocytes Negative   Performed Status Automated   Bottle Number   ([employer] Mult[employer]ix 10 SG) [id number]   Lot # [id number]   Expiration Date 11/14/20   Initials AR   Gurney Maxin, RN  07/31/2019, 13:33

## 2019-07-31 NOTE — Progress Notes (Signed)
Name: Ellen Dillon MRN:  [id number]   Date: 07/31/2019 Age: 38 y.o. 09/01/1981          Subjective:   Ellen Dillon is being seen today for her obstetrical visit.  She is at [redacted]w[redacted]d weeks gestation.       Objective:   Weight: 87.2 kg (192 lb 3.2 oz)  BP (Non-Invasive): 138/80  Fundal height: 34  Preterm Labor: None  Fetal Movement: Present  Edema: Negative  FHR (1): 148            BP 138/80   Pulse 74   Ht 1.727 m (5\' 8" )   Wt 87.2 kg (192 lb 3.2 oz)   LMP 12/09/2018   SpO2 98%   BMI 29.22 kg/m       Lab Results   Component Value Date    SYPHILISQL Non-reactive 02/27/2019    HIV12 NONREACTIVE 12/25/2011    UCULT NO GROWTH AFTER 2 DAYS INCUBATION. 12/25/2011     Assessment:   Pregnancy [redacted]w[redacted]d weeks    ICD-10-CM    1. [redacted] weeks gestation of pregnancy Z3A.33 POCT URINE DIPSTICK     DME - GLUCOMETER     DME - DIABETIC SUPPLIES   2. Smoking (tobacco) complicating pregnancy, second trimester O99.332    3. 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 Seizure (CMS HCC) R56.9    5. History of pregnancy induced hypertension Z87.59    6. Hx of induced abortion Z98.890    7. Hx of spontaneous abortion, currently pregnant, third trimester O09.293    8. 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 Multigravida of advanced maternal age in third trimester O09.523    67. 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 High-risk pregnancy in third trimester O09.93    13. 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 O99.213    14. Anxiety F41.9    15. Hx: UTI (urinary tract infection) Z87.440 URINE CULTURE   16. Advanced maternal age in multigravida, third trimester O09.523 DME - GLUCOMETER     DME - DIABETIC SUPPLIES   76. Gestational diabetes O24.419 DME - GLUCOMETER     DME - DIABETIC SUPPLIES     A1c=6.0 and is diagnostic of GDM. Discussed and options offered, she had questions answered to her satisfaction, indicated excellent understanding and wished to start  testing herself. Send to Dietitian [facility] Diabetic Educ[facility] and supplies sent for Qid testing. RTO 1 week for NST and check of BSs. MFM sono was nominal and they discharged her from follow-up.     Plan:       Christiane Ha, MD

## 2019-07-31 NOTE — Progress Notes (Signed)
Name: Ellen Dillon MRN:  [id number]   Date: 07/31/2019 Age: 38 y.o. [birth year]          Subjective:   Ellen Dillon is being seen today for her obstetrical visit.  She is at [redacted]w[redacted]d weeks gestation.       Objective:   Weight: 87.2 kg (192 lb 3.2 oz)  BP (Non-Invasive): 138/80  Fundal height: 34  Preterm Labor: None  Fetal Movement: Present  Edema: Negative  FHR (1): 148            BP 138/80   Pulse 74   Ht 1.727 m (5\' 8" )   Wt 87.2 kg (192 lb 3.2 oz)   LMP 12/09/2018   SpO2 98%   BMI 29.22 kg/m       Lab Results   Component Value Date    SYPHILISQL Non-reactive 02/27/2019    HIV12 NONREACTIVE 12/25/2011    UCULT NO GROWTH AFTER 2 DAYS INCUBATION. 12/25/2011     Assessment:   Pregnancy [redacted]w[redacted]d weeks    ICD-10-CM    1. [redacted] weeks gestation of pregnancy Z3A.33 POCT URINE DIPSTICK   2. Smoking (tobacco) complicating pregnancy, second trimester O99.332    3. 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 Seizure (CMS HCC) R56.9    5. History of pregnancy induced hypertension Z87.59    6. Hx of induced abortion Z98.890    7. Hx of spontaneous abortion, currently pregnant, third trimester O09.293    8. Family history of diabetes mellitus in mother Z30.3    59. 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 Multigravida of advanced maternal age in third trimester O09.523    32. 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 High-risk pregnancy in third trimester O09.93    13. 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 O99.213    14. Anxiety F41.9    15. Hx: UTI (urinary tract infection) Z87.440 URINE CULTURE   16. Advanced maternal age in multigravida, third trimester O09.523        Plan:     See weekly to monitor BPs as she had elevated BP in prior pregnancy. MFM report from 07/22/19 was nominal. A1c is 6.0  Christiane Ha, MD

## 2019-07-31 NOTE — Addendum Note (Signed)
Addended by: Christiane Ha on: 07/31/2019 03:07 PM     Modules accepted: Orders

## 2019-08-01 ENCOUNTER — Emergency Department: Admit: 2019-08-01 | Discharge: 2019-08-01 | Payer: PRIVATE HEALTH INSURANCE

## 2019-08-01 ENCOUNTER — Encounter: Admit: 2019-08-01 | Discharge: 2019-08-01

## 2019-08-01 ENCOUNTER — Emergency Department: Admit: 2019-08-01 | Discharge: 2019-08-02 | Disposition: A | Discharge status: Home / Self Care

## 2019-08-01 DIAGNOSIS — R102 Pelvic and perineal pain: Principal | ICD-10-CM

## 2019-08-01 DIAGNOSIS — R109 Unspecified abdominal pain: Secondary | ICD-10-CM

## 2019-08-01 LAB — URINE CULTURE: URINE CULTURE: NO GROWTH

## 2019-08-01 MED ORDER — LACTATED RINGERS IV SOLP
1000 mL | Freq: Once | INTRAVENOUS | 0 refills | Status: CP
Start: 2019-08-01 — End: ?
  Administered 2019-08-02: 03:00:00 1000 mL via INTRAVENOUS

## 2019-08-01 MED ORDER — IOHEXOL 350 MG IODINE/ML IV SOLN
100 mL | Freq: Once | INTRAVENOUS | 0 refills | Status: CP
Start: 2019-08-01 — End: ?
  Administered 2019-08-01: 17:00:00 100 mL via INTRAVENOUS

## 2019-08-01 MED ORDER — SODIUM CHLORIDE 0.9 % IJ SOLN
50 mL | Freq: Once | INTRAVENOUS | 0 refills | Status: CP
Start: 2019-08-01 — End: ?
  Administered 2019-08-01: 17:00:00 50 mL via INTRAVENOUS

## 2019-08-01 MED ORDER — KETOROLAC 15 MG/ML IJ SOLN
15 mg | Freq: Once | INTRAVENOUS | 0 refills | Status: CP
Start: 2019-08-01 — End: ?
  Administered 2019-08-02: 03:00:00 15 mg via INTRAVENOUS

## 2019-08-01 NOTE — Telephone Encounter
Pt left V/M stating she is having what feels like more increased vaginal bleeding and pain.    Returned call to pt. pt states she had a hysterectomy 07/21/19, having right sided pain where her incision is, area is very tender. Pt states she has been having vaginal bleeding since surgery. Pt wearing a pad, changing pad about 3-4 times a day. Pad is half way full when she changes the pad. Pt states blood is bright red.  Feels like bleeding has picked up more than what she orginally had.    Pt also states she has been constipated, will only have a bowel movement if she takes medication for it, otherwise will not have one.    Pt more concerned about the vaginal bleeding and would like recommendations from Dr.Ahmed.  Advised pt I would send a message and give her a call back with recommendations.  Pt V/U.

## 2019-08-01 NOTE — Telephone Encounter
Returned call to pt. advised pt to present to ER for evaluation.    Pt V/U will come to [facility].

## 2019-08-02 ENCOUNTER — Emergency Department: Admit: 2019-08-02 | Discharge: 2019-08-02 | Payer: PRIVATE HEALTH INSURANCE

## 2019-08-02 DIAGNOSIS — Z90710 Acquired absence of both cervix and uterus: Secondary | ICD-10-CM

## 2019-08-02 DIAGNOSIS — N939 Abnormal uterine and vaginal bleeding, unspecified: Secondary | ICD-10-CM

## 2019-08-02 DIAGNOSIS — R109 Unspecified abdominal pain: Secondary | ICD-10-CM

## 2019-08-02 DIAGNOSIS — N390 Urinary tract infection, site not specified: Secondary | ICD-10-CM

## 2019-08-02 DIAGNOSIS — R102 Pelvic and perineal pain: Secondary | ICD-10-CM

## 2019-08-02 LAB — CBC AND DIFF
Lab: 0.1 10*3/uL (ref 0–0.45)
Lab: 1 % (ref >60)
Lab: 1.8 10*3/uL (ref 1.0–4.8)
Lab: 10 K/UL (ref 4.5–11.0)
Lab: 13 % — ABNORMAL LOW (ref 11–15)
Lab: 13 g/dL (ref 12.0–15.0)
Lab: 18 % — ABNORMAL LOW (ref 24–44)
Lab: 227 10*3/uL (ref 150–400)
Lab: 28 pg (ref 26–34)
Lab: 33 g/dL (ref 32.0–36.0)
Lab: 39 % (ref 36–45)
Lab: 4 % (ref 4–12)
Lab: 4.6 M/UL (ref 4.0–5.0)
Lab: 75 % (ref 41–77)
Lab: 85 FL — AB (ref 80–100)
Lab: 9.5 FL (ref 7–11)
Lab: 18 (ref <20.7)

## 2019-08-02 LAB — URINALYSIS MICROSCOPIC REFLEX TO CULTURE

## 2019-08-02 LAB — DIRECT EXAM (WET PREP)

## 2019-08-02 LAB — URINALYSIS DIPSTICK REFLEX TO CULTURE

## 2019-08-02 LAB — COMPREHENSIVE METABOLIC PANEL: Lab: 139 MMOL/L (ref 137–147)

## 2019-08-02 MED ORDER — CEPHALEXIN 500 MG PO CAP
500 mg | ORAL_CAPSULE | Freq: Two times a day (BID) | ORAL | 0 refills | Status: AC
Start: 2019-08-02 — End: ?

## 2019-08-02 MED ORDER — HYDROCODONE-ACETAMINOPHEN 10-325 MG PO TAB
1 | ORAL_TABLET | ORAL | 0 refills | 15.00000 days | Status: DC | PRN
Start: 2019-08-02 — End: 2019-08-13

## 2019-08-02 MED ORDER — ONDANSETRON HCL (PF) 4 MG/2 ML IJ SOLN
4 mg | Freq: Once | INTRAVENOUS | 0 refills | Status: CP
Start: 2019-08-02 — End: ?
  Administered 2019-08-02: 07:00:00 4 mg via INTRAVENOUS

## 2019-08-02 MED ORDER — FENTANYL CITRATE (PF) 50 MCG/ML IJ SOLN
50 ug | Freq: Once | INTRAVENOUS | 0 refills | Status: CP
Start: 2019-08-02 — End: ?
  Administered 2019-08-02: 07:00:00 50 ug via INTRAVENOUS

## 2019-08-02 MED ORDER — DOCUSATE SODIUM 100 MG PO CAP
100 mg | ORAL_CAPSULE | Freq: Two times a day (BID) | ORAL | 0 refills | Status: DC
Start: 2019-08-02 — End: 2019-08-13

## 2019-08-02 MED ORDER — POLYETHYLENE GLYCOL 3350 17 GRAM PO PWPK
17 g | Freq: Every day | ORAL | 0 refills | 22.00000 days | Status: DC
Start: 2019-08-02 — End: 2019-08-13

## 2019-08-02 NOTE — ED Notes
Pt. DC from ED (40) @ [phone]).  Pt provided with discharge teaching and instructions.  Pt states understanding and denies any additional questions. Pt ambulates with steady gate and no acute distress.

## 2019-08-02 NOTE — ED Provider Notes
Dorothy Todd is a 38 y.o. female.    Chief Complaint:  Chief Complaint   Patient presents with   ??? Post-Op Problem     completed hysterectomy on 7/27 with current vaginal bleeding x 2-3 days. Heaviest today with 4 pads today. Reports she felt something tear earlier today. Struggling currently with constipation. Urged to come for eval by gyn.        History of Present Illness:  38 year old female presents to the ED with complaints of vaginal bleeding.  Patient is status post complete hysterectomy on 7/27, discharged without complication, states that she had a few days of pain medicine which was helpful, presents today as she endorses she has continued to have vaginal bleeding since the procedure has been done.  She endorses bright red bleeding and changing her pad 4 times daily.  She denies clots.  She reports 1 week ago she had a popping pain in her lower pelvis.  She endorses that she has suprapubic pain, vaginal pain, right lower quadrant pain that are slowly worsening.  She denies any fevers or chills, denies any trauma, heavy lifting, has not attempted sexual intercourse.  Patient reports that she called gynecology today for management recommendations and she was directed to the ED.  She has not taken anything for pain control prior to arrival.  Rates her pain almost a 10 out of 10.      History provided by:  Patient      Review of Systems:  Review of Systems   All other systems reviewed and are negative.      Allergies:  Cherry and Firefighter    Past Medical History:  Medical History:   Diagnosis Date   ??? Anxiety    ??? Arthritis     Osteo   ??? Asthma    ??? Back pain    ??? Coronary artery disease    ??? Depression    ??? Diabetes (HCC)    ??? Gastrointestinal disorder     Well controlled   ??? Herpes    ??? HTN (hypertension)    ??? Hyperlipidemia    ??? Hypothyroidism    ??? Migraines    ??? Sinus problem     a mass of mold       Past Surgical History:  Surgical History:   Procedure Laterality Date ??? ENDOMETRIAL ABLATION  2016   ??? HX TONSILLECTOMY  2017   ??? ENDOSCOPY NASAL/ SINUS WITH ETHMOIDECTOMY AND SPHENOIDOTOMY/ TISSUE REMOVAL Left 09/18/2018    Performed by Neldon Newport, MD at [facility] OR   ??? ENDOSCOPY NASAL/ SINUS WITH EXCISION TISSUE MAXILLARY SINUS Left 09/18/2018    Performed by Neldon Newport, MD at [facility] OR   ??? STEREOTACTIC COMPUTER-ASSISTED CRANIAL PROCEDURE - EXTRADURAL N/A 09/18/2018    Performed by Neldon Newport, MD at [facility] OR   ??? TOTAL LAPAROSCOPIC HYSTERECTOMY, BILATERAL SALPINGECTOMY Bilateral 07/21/2019    Performed by Vear Clock, MD at [facility] OR   ??? CYSTOURETHROSCOPY N/A 07/21/2019    Performed by Vear Clock, MD at [facility] OR   ??? HX CESAREAN SECTION      [place]   ??? HX TUBAL LIGATION         Pertinent medical/surgical history reviewed  Medical History:   Diagnosis Date   ??? Anxiety    ??? Arthritis     Osteo   ??? Asthma    ??? Back pain    ??? Coronary artery disease    ??? Depression    ???  Diabetes (HCC)    ??? Gastrointestinal disorder     Well controlled   ??? Herpes    ??? HTN (hypertension)    ??? Hyperlipidemia    ??? Hypothyroidism    ??? Migraines    ??? Sinus problem     a mass of mold     Surgical History:   Procedure Laterality Date   ??? ENDOMETRIAL ABLATION  2016   ??? HX TONSILLECTOMY  2017   ??? ENDOSCOPY NASAL/ SINUS WITH ETHMOIDECTOMY AND SPHENOIDOTOMY/ TISSUE REMOVAL Left 09/18/2018    Performed by Neldon Newport, MD at [facility] OR   ??? ENDOSCOPY NASAL/ SINUS WITH EXCISION TISSUE MAXILLARY SINUS Left 09/18/2018    Performed by Neldon Newport, MD at [facility] OR   ??? STEREOTACTIC COMPUTER-ASSISTED CRANIAL PROCEDURE - EXTRADURAL N/A 09/18/2018    Performed by Neldon Newport, MD at [facility] OR   ??? TOTAL LAPAROSCOPIC HYSTERECTOMY, BILATERAL SALPINGECTOMY Bilateral 07/21/2019    Performed by Vear Clock, MD at [facility] OR   ??? CYSTOURETHROSCOPY N/A 07/21/2019    Performed by Vear Clock, MD at [facility] OR   ??? HX CESAREAN SECTION      GA   ??? HX TUBAL LIGATION         Social History:  Social History     Tobacco Use ??? Smoking status: Never Smoker   ??? Smokeless tobacco: Never Used   Substance Use Topics   ??? Alcohol use: Yes     Comment: occ.   ??? Drug use: No     Social History     Substance and Sexual Activity   Drug Use No       Family History:  Family History   Problem Relation Age of Onset   ??? Diabetes Mother    ??? Hypertension Mother    ??? Diabetes Maternal Grandmother    ??? Heart Disease Maternal Grandmother    ??? Stroke Maternal Grandmother    ??? High Cholesterol Maternal Grandmother    ??? Diabetes Maternal Grandfather    ??? Diabetes Paternal Grandmother    ??? Cancer Paternal Grandmother         cervical   ??? High Cholesterol Paternal Grandmother    ??? Stroke Paternal Grandmother    ??? Heart Disease Paternal Grandmother    ??? Cervical Cancer Paternal Grandmother    ??? Diabetes Paternal Grandfather    ??? Hypertension Father    ??? Cancer Paternal Aunt         breast&stomach   ??? Cancer-Breast Paternal Aunt    ??? Stomach Cancer Paternal Aunt    ??? Cancer Paternal Aunt         breast&cervical   ??? Cancer-Breast Paternal Aunt    ??? Cervical Cancer Paternal Aunt    ??? Cancer-Colon Neg Hx    ??? Cancer-Ovarian Neg Hx    ??? Cancer-Uterine Neg Hx        Vitals:  ED Vitals    Date and Time T BP P RR SPO2P SPO2 User   08/01/19 2300 -- 146/65 -- -- 102 100 % [place]   08/01/19 2149 -- -- -- -- 83 100 % [place]   08/01/19 2131 -- -- -- -- 82 100 % [place]   08/01/19 2130 -- 122/64 -- -- -- -- [place]   08/01/19 1917 36.5 ???C (97.7 ???F) 128/76 -- 22 PER MINUTE 105 100 % [place]   08/01/19 1916 -- 145/85 -- -- 98 100 % ML  Physical Exam:  Physical Exam  Vitals signs and nursing note reviewed. Exam conducted with a chaperone present Morrie Sheldon, nurse ).   Constitutional:       Appearance: Normal appearance. She is obese. She is not toxic-appearing.   HENT:      Head: Normocephalic and atraumatic.      Right Ear: External ear normal.      Left Ear: External ear normal.      Nose: Nose normal.   Neck:      Musculoskeletal: Normal range of motion and neck supple.   Cardiovascular: Rate and Rhythm: Normal rate and regular rhythm.      Pulses: Normal pulses.      Heart sounds: Normal heart sounds.   Pulmonary:      Effort: Pulmonary effort is normal.      Breath sounds: Normal breath sounds.   Abdominal:      General: Abdomen is flat. Bowel sounds are normal. There is no distension.      Palpations: Abdomen is soft. There is no mass.      Tenderness: There is abdominal tenderness. There is guarding.      Hernia: No hernia is present.   Genitourinary:     Exam position: Lithotomy position.      Vagina: Bleeding (blood tinged discharge, some bubbles appreciated at the back of the vaginal wall region, speculum performed only to patient tolerance) present.   Musculoskeletal: Normal range of motion.   Skin:     General: Skin is warm and dry.      Capillary Refill: Capillary refill takes less than 2 seconds.   Neurological:      General: No focal deficit present.      Mental Status: She is alert and oriented to person, place, and time. Mental status is at baseline.   Psychiatric:         Mood and Affect: Mood normal.         Behavior: Behavior normal.         Laboratory Results:  Labs Reviewed   URINALYSIS DIPSTICK REFLEX TO CULTURE - Abnormal       Result Value Ref Range Status    Color,UA YELLOW   Final    Turbidity,UA CLEAR  CLEAR-CLEAR Final    Specific Gravity-Urine 1.016  1.003 - 1.035 Final    pH,UA 6.0  5.0 - 8.0 Final    Protein,UA NEG  NEG-NEG Final    Glucose,UA NEG  NEG-NEG Final    Ketones,UA NEG  NEG-NEG Final    Bilirubin,UA NEG  NEG-NEG Final    Blood,UA 2+ (*) NEG-NEG Final    Urobilinogen,UA NORMAL  NORM-NORMAL Final    Nitrite,UA NEG  NEG-NEG Final    Leukocytes,UA 3+ (*) NEG-NEG Final    Urine Ascorbic Acid, UA NEG  NEG-NEG Final   COMPREHENSIVE METABOLIC PANEL - Abnormal    Sodium 139  137 - 147 MMOL/L Final    Potassium 3.9  3.5 - 5.1 MMOL/L Final    Chloride 106  98 - 110 MMOL/L Final    Glucose 99  70 - 100 MG/DL Final    Blood Urea Nitrogen 11  7 - 25 MG/DL Final Creatinine 6.96  0.4 - 1.00 MG/DL Final    Calcium 9.4  8.5 - 10.6 MG/DL Final    Total Protein 7.7  6.0 - 8.0 G/DL Final    Total Bilirubin 0.2 (*) 0.3 - 1.2 MG/DL Final    Albumin 4.0  3.5 - 5.0  G/DL Final    Alk Phosphatase 76  25 - 110 U/L Final    AST (SGOT) 10  7 - 40 U/L Final    CO2 24  21 - 30 MMOL/L Final    ALT (SGPT) 11  7 - 56 U/L Final    Anion Gap 9  3 - 12 Final    eGFR Non African American >60  >60 mL/min Final    eGFR African American >60  >60 mL/min Final   CBC AND DIFF - Abnormal    White Blood Cells 10.1  4.5 - 11.0 K/UL Final    RBC 4.68  4.0 - 5.0 M/UL Final    Hemoglobin 13.2  12.0 - 15.0 GM/DL Final    Hematocrit 16.1  36 - 45 % Final    MCV 85.3  80 - 100 FL Final    MCH 28.1  26 - 34 PG Final    MCHC 33.0  32.0 - 36.0 G/DL Final    RDW 09.6  11 - 15 % Final    Platelet Count 227  150 - 400 K/UL Final    MPV 9.5  7 - 11 FL Final    Neutrophils 75  41 - 77 % Final    Lymphocytes 18 (*) 24 - 44 % Final    Monocytes 4  4 - 12 % Final    Eosinophils 2  0 - 5 % Final    Basophils 1  0 - 2 % Final    Absolute Neutrophil Count 7.57 (*) 1.8 - 7.0 K/UL Final    Absolute Lymph Count 1.83  1.0 - 4.8 K/UL Final    Absolute Monocyte Count 0.43  0 - 0.80 K/UL Final    Absolute Eosinophil Count 0.18  0 - 0.45 K/UL Final    Absolute Basophil Count 0.06  0 - 0.20 K/UL Final    MDW (Monocyte Distribution Width) 18.6  <20.7 Final   DIRECT EXAM (WET PREP)    Battery Name DIRECT EXAM,WET PREP       Specimen Description     Final    Value: VAGINAL  CERVICAL      Special Requests NONE   Final    Direct Exam     Final    Value: NO YEAST SEEN  NO CLUE CELLS SEEN  NO TRICHOMONAS SEEN  MANY  NEUTROPHILS      Report Status     Final    Value: FINAL  08/01/2019     CULTURE-URINE W/SENSITIVITY   URINALYSIS MICROSCOPIC REFLEX TO CULTURE    WBCs,UA 10-20  0 - 2 /HPF Final    RBCs,UA 0-2  0 - 3 /HPF Final    Comment,UA     Final    Value: Criteria for reflex to culture are WBC>10, Positive Nitrite, and/or >=+1 leukocytes. If quantity is not sufficient, an addendum will follow.      MucousUA TRACE   Final    Squamous Epithelial Cells 5-10  0 - 5 Final   URINE CLEAR TOP TUBE   UA REFLEX CULTURE LABEL   BLOOD BANK SAMPLE HOLD    BB Sample hold IN LAB   Final          Radiology Interpretation:    US PELVIS NON OB COMP   Final Result   Abnormal         1.  Simple left ovarian cyst measuring up to 6.3 cm, likely physiologic in a patient this age.  Follow-up pelvic ultrasound in 6-12 weeks is suggested to assess for evolution/involution.   2.  No evidence of left ovarian torsion. Right ovary not visualized.   3.  Surgically absent uterus.      #FOLLOW      By my electronic signature, I attest that I have personally reviewed the images for this examination and formulated the interpretations and opinions expressed in this report          Finalized by Jason Coop, M.D. on 08/02/2019 3:24 AM. Dictated by Norva Pavlov, MD on 08/02/2019 3:13 AM.         CT ABD/PELV W CONTRAST   Final Result         1.  No bowel obstruction or focal abscess.    2.  Interval hysterectomy. Vaginal cuff is unremarkable. Trace pelvic ascites, likely physiologic or postoperative.   3.  Left adnexal cystic lesion measuring up to 5.8 cm, likely representing a benign ovarian cyst in a patient this age; however, postoperative seroma could appear similar. This does not have an appearance to suggest abscess or acute hematoma. Pelvic ultrasound is recommended for further evaluation.      By my electronic signature, I attest that I have personally reviewed the images for this examination and formulated the interpretations and opinions expressed in this report          Finalized by Sherolyn Buba, M.D. on 08/02/2019 12:56 AM. Dictated by Norva Pavlov, MD on 08/02/2019 12:07 AM.             EKG:      ED Course:  11:55pm  Patient given toradol and IVF for symptoms, labs and pelvic exam obtained, patient refused testing for c/g, labs reviewed and stable, questionable uti, gyn consulted to see patient with post-op concerns, recommend further eval with CT abd/pelv to rule out other pathology, post-op inf/stranding, assess issues of constipation pain further. Will plan to follow-up with ct findings when complete. CT showed possible complex cyst, gyne recommended pelvic US which was ordered. They evaluated patient, please see their note for recommendations. Plan for discharge with additional pain control.       MDM  Reviewed: nursing note, vitals and previous chart  Reviewed previous: labs and ultrasound  Interpretation: CT scan and labs        Facility Administered Meds:  Medications   fentaNYL citrate PF (SUBLIMAZE) injection 50 mcg (has no administration in time range)   ondansetron (ZOFRAN) injection 4 mg (has no administration in time range)   lactated ringers infusion (1,000 mL Intravenous Given - New Bag 08/01/19 2149)   ketorolac (TORADOL) injection 15 mg (15 mg Intravenous Given 08/01/19 2149)   iohexoL (OMNIPAQUE-350) 350 mg/mL injection 100 mL (100 mL Intravenous Given 08/02/19 0000)   sodium chloride PF 0.9% injection 50 mL (50 mL Intravenous Given 08/02/19 0000)         Clinical Impression:  Clinical Impression   Pelvic pain   Urinary tract infection without hematuria, site unspecified       Disposition/Follow up  ED Disposition     ED Disposition    Discharge        DPT [place], OBGYN  [address]  [place] [address]  [phone]    Schedule an appointment as soon as possible for a visit         Medications:  New Prescriptions    CEPHALEXIN (KEFLEX) 500 MG CAPSULE    Take one capsule by mouth every 12 hours  for 7 days.       Procedure Notes:  Procedures      Attestation / Supervision:  The service was provided by the APP alone with immediate availability of a physician in the ED.      Prince Rome, PA-C    Attestation / Supervision Note concerning Aryauna Vonkaenel: I personally interviewed and examined the patient.  I have reviewed the history, physical, impression and plan outlined by the Physician Assistant.    The patient presents with (HPI) vaginal bleeding and pain post hysterectomy,  On examination there is pelvic tenderness, see PA note above for vaginal exam, CT scan with possible cyst, [place] with ovarian cyst noted, no torsion  My impression is post-op pain and bleeding,  My plan is increase pain medications with bowel reg per gyne consult recs, follow up with gynecology .      Oneida Alar, MD

## 2019-08-02 NOTE — ED Notes
Pt resting comfortably in bed, denies any new complaints, need for restroom assistance, or further needs at this time. Pts call light in reach, bed in low locked position. Belongings with pt at bedside.

## 2019-08-02 NOTE — Consults
Gynecology Consult  History and Physical Examination      Dorothy Todd  Admission Date: 08/01/2019                     Assessment:   38 y.o. [id number] s/p TLH, bilateral salpingectomy consult for vaginal bleeding. H/H and vitals stable. No leukocytosis. Vaginal cuff intact    Plan:    ~CT scan -   1. ???No bowel obstruction or focal abscess.   2. ???Interval hysterectomy. Vaginal cuff is unremarkable. Trace pelvic ascites, likely physiologic or postoperative.  3. ???Left adnexal cystic lesion measuring up to 5.8 cm, likely representing a benign ovarian cyst in a patient this age; however, postoperative seroma could appear similar. This does not have an appearance to suggest abscess or acute hematoma. Pelvic ultrasound is recommended for further evaluation.  ~Abdominal Ultrasound  1. ???Simple left ovarian cyst measuring up to 6.3 cm, likely physiologic in a patient this age. Follow-up pelvic ultrasound in 6-12 weeks is suggested to assess for evolution/involution.  2. ???No evidence of left ovarian torsion. Right ovary not visualized.  3. ???Surgically absent uterus.  ~Increase Norco to 10mg  PO BID   ~Miralax 17g twice a day with Colace 100mg  at bedtime.  ~Follow up with Dr. Tasia Catchings in 1-2 weeks.     Pt seen and discussed with Dr. Earl Lites, PGY-4 and Dr. Wallace Cullens, OBGYN attending    Keith Rake, MD  PGY-3, Obstetrics and Gynecology     Please page the Gynecology pager at [phone] with any questions or concerns.  Thank you for allowing [place] to participate in the care of your patient.    ____________________________________________________________________________  Reason for Consult:  Vaginal Bleeding    History of Present Illness:   Consepcion Todd is a 38 y.o. [id number] POD#11 s/p TLH, bilateral salpingectomy presenting to the ER for vaginal bleeding. Pt reports that she has had vaginal bleeding since day of surgery, however, 3 days ago she noted increased bleeding with bright red blood. She woke up to a saturated pad and soaked through her clothing. Today she changed her pad 3 times and they were saturated.  Pt also reports constipation where she is unable to have a bowel movement unless she takes a laxative. She uses a laxative to have a BM every other day. Last BM yesterday, normal w/o straining.  She reports that she also has abdominal pain that is felt most at the umbilicus and in right lower quadrant. It hurts more with movement, laughing, coughing, etc. Pt describes a popping sensation at the navel 3 days ago. Pain is 8/10 at worse and 6/10 after receiving Toradol in ED. She has been taking Norco 7.5mg  BID for hx of osteoarthritis.   Endorses lightheadedness, dizziness, headache, palpitations, burning with urination, Denies chest pain, SOB.    Review of Systems:  A comprehensive review of systems was performed and was negative, except as in HPI    OB History:   G5P4014, SVD x                      Gyn History:      Menstrual Cycles: s/p TLH     PmHx:   Medical History:   Diagnosis Date   ??? Anxiety    ??? Arthritis     Osteo   ??? Asthma    ??? Back pain    ??? Coronary artery disease    ??? Depression    ??? Diabetes (HCC)    ???  Gastrointestinal disorder     Well controlled   ??? Herpes    ??? HTN (hypertension)    ??? Hyperlipidemia    ??? Hypothyroidism    ??? Migraines    ??? Sinus problem     a mass of mold       PsHx:   Surgical History:   Procedure Laterality Date   ??? ENDOMETRIAL ABLATION  2016   ??? HX TONSILLECTOMY  2017   ??? ENDOSCOPY NASAL/ SINUS WITH ETHMOIDECTOMY AND SPHENOIDOTOMY/ TISSUE REMOVAL Left 09/18/2018    Performed by Neldon Newport, MD at [facility] OR   ??? ENDOSCOPY NASAL/ SINUS WITH EXCISION TISSUE MAXILLARY SINUS Left 09/18/2018    Performed by Neldon Newport, MD at [facility] OR   ??? STEREOTACTIC COMPUTER-ASSISTED CRANIAL PROCEDURE - EXTRADURAL N/A 09/18/2018    Performed by Neldon Newport, MD at [facility] OR   ??? TOTAL LAPAROSCOPIC HYSTERECTOMY, BILATERAL SALPINGECTOMY Bilateral 07/21/2019    Performed by Vear Clock, MD at [facility] OR ??? CYSTOURETHROSCOPY N/A 07/21/2019    Performed by Vear Clock, MD at [facility] OR   ??? HX CESAREAN SECTION      GA   ??? HX TUBAL LIGATION         FmHx:   Family History   Problem Relation Age of Onset   ??? Diabetes Mother    ??? Hypertension Mother    ??? Diabetes Maternal Grandmother    ??? Heart Disease Maternal Grandmother    ??? Stroke Maternal Grandmother    ??? High Cholesterol Maternal Grandmother    ??? Diabetes Maternal Grandfather    ??? Diabetes Paternal Grandmother    ??? Cancer Paternal Grandmother         cervical   ??? High Cholesterol Paternal Grandmother    ??? Stroke Paternal Grandmother    ??? Heart Disease Paternal Grandmother    ??? Cervical Cancer Paternal Grandmother    ??? Diabetes Paternal Grandfather    ??? Hypertension Father    ??? Cancer Paternal Aunt         breast&stomach   ??? Cancer-Breast Paternal Aunt    ??? Stomach Cancer Paternal Aunt    ??? Cancer Paternal Aunt         breast&cervical   ??? Cancer-Breast Paternal Aunt    ??? Cervical Cancer Paternal Aunt    ??? Cancer-Colon Neg Hx    ??? Cancer-Ovarian Neg Hx    ??? Cancer-Uterine Neg Hx        Social:   Denies tobacco, Denies EtOH, Denies illicit drug use.    Married, feels safe at home.  Works as Engineer, civil (consulting).    Allergies:    Cherry and Cherry flavor    Medications:  No current facility-administered medications on file prior to encounter.      Current Outpatient Medications on File Prior to Encounter   Medication Sig Dispense Refill   ??? acetaminophen (TYLENOL) 325 mg tablet Take two tablets by mouth every 4 hours as needed for Pain. (Patient not taking: Reported on 07/10/2019) 30 tablet 0   ??? atenolol (TENORMIN) 25 mg tablet Take 25 mg by mouth daily.     ??? atorvastatin (LIPITOR) 20 mg tablet Take 20 mg by mouth at bedtime daily.     ??? azelastine(+) (ASTELIN) 137 mcg (0.1 %) nasal spray Apply two sprays to each nostril as directed twice daily. Use in each nostril as directed 90 mL 3   ??? cetirizine (ZYRTEC) 10 mg tablet Take 10 mg by mouth  daily as needed. ??? diclofenac(+) (VOLTAREN) 1 % topical gel Apply two g topically to affected area three times daily as needed. 200 g 0   ??? duloxetine DR (CYMBALTA) 60 mg capsule Take 60 mg by mouth at bedtime daily.     ??? fluticasone propionate (FLONASE) 50 mcg/actuation nasal spray Apply two sprays to each nostril as directed twice daily. Shake bottle gently before using. 96 g 3   ??? fluticasone propionate (FLOVENT HFA) 220 mcg/actuation inhaler Inhale 2 puffs by mouth into the lungs every 12 hours.     ??? furosemide (LASIX) 20 mg tablet Take 20 mg by mouth daily as needed.     ??? gabapentin (NEURONTIN) 300 mg capsule Take 300 mg by mouth every 8 hours.     ??? HYDROcodone/acetaminophen (NORCO) 5/325 mg tablet Take 1 tablet by mouth every 4 hours as needed for Pain     ??? ibuprofen (MOTRIN) 600 mg tablet Take one tablet by mouth every 6 hours as needed. Take with food. 30 tablet 0   ??? levothyroxine (SYNTHROID) 25 mcg tablet Take 25 mcg by mouth daily 30 minutes before breakfast.     ??? liraglutide(+) (VICTOZA) 0.6 mg/0.1 mL (18 mg/3 mL) pnij Inject 0.6 mg under the skin daily.     ??? lisinopriL-hydrochlorothiazide (ZESTORETIC) 10-12.5 mg tablet Take 1 tablet by mouth every morning.     ??? methylPREDNIsolone (MEDROL DOSEPAK) 4 mg tablet Take medication as directed on package for 6 days. Take with food. (Patient not taking: Reported on 07/10/2019) 21 tablet 0   ??? ondansetron (ZOFRAN ODT) 4 mg rapid dissolve tablet Dissolve one tablet by mouth every 8 hours as needed for Nausea or Vomiting. Place on tongue to disolve. (Patient not taking: Reported on 07/10/2019) 10 tablet 0   ??? oxyCODONE (ROXICODONE) 5 mg tablet Take one tablet to two tablets by mouth every 4 hours as needed 30 tablet 0   ??? oxyCODONE (ROXICODONE, OXY-IR) 5 mg tablet Take one tablet by mouth every 4 hours as needed for Pain (Patient not taking: Reported on 07/10/2019) 10 tablet 0   ??? pantoprazole DR (PROTONIX) 40 mg tablet Take 40 mg by mouth at bedtime daily. ??? potassium chloride SR (K-DUR) 20 mEq tablet Take 20 mEq by mouth daily as needed. Take with a meal and a full glass of water.      ??? prednisone (DELTASONE) 10 mg tablet Take 2 tablets daily for 5 days, then 1 for 5 days (Patient not taking: Reported on 07/10/2019) 15 tablet 0   ??? sodium chloride/aloe vera (AYR) nasal spray Apply one spray to two sprays to each nostril as directed four times daily. (Patient not taking: Reported on 07/10/2019) 1 Bottle 0   ??? tiZANidine (ZANAFLEX) 4 mg tablet Take 4 mg by mouth every 8 hours as needed.     ??? VENTOLIN HFA 90 mcg/actuation aerosol inhaler Inhale 2 puffs by mouth into the lungs every 6 hours as needed for Wheezing or Shortness of Breath. Shake well before use.     ??? zolpidem (AMBIEN) 10 mg tablet Take 10 mg by mouth at bedtime as needed for Sleep.         Physical Exam:  Vitals:    08/01/19 1917 08/01/19 2130 08/01/19 2131 08/01/19 2149   BP: 128/76 122/64     BP Source: Arm, Left Upper      Temp: 36.5 ???C (97.7 ???F)      SpO2: 100%  100% 100%   Weight:  Gen: NAD  CV: RRR, no appreciable murmurs  Chest: Unlabored, CTAB  Abd: Diffusely tender to light palpation greatest in right lower, no rebound or guarding,   Ext: No LE edema, nttp LE, Homan'd sign absent bilaterally  GU: normal appearing external genitalia, vaginal cuff visually intact with a small band of granulation tissue, no defect noted, small amount of bleeding noted from the surface of the cuff. On bimanual exam appropriately tender to palpation, no palpable defect, no abnormal vaginal discharge      Lab/Radiology/Other Diagnostic Tests:    24-hour labs:    Results for orders placed or performed during the hospital encounter of 08/01/19 (from the past 24 hour(s))   BLOOD BANK SAMPLE HOLD    Collection Time: 08/01/19  8:53 PM   Result Value Ref Range    BB Sample hold IN LAB    URINALYSIS DIPSTICK REFLEX TO CULTURE    Collection Time: 08/01/19  8:54 PM   Result Value Ref Range    Color,UA YELLOW Turbidity,UA CLEAR CLEAR-CLEAR    Specific Gravity-Urine 1.016 1.003 - 1.035    pH,UA 6.0 5.0 - 8.0    Protein,UA NEG NEG-NEG    Glucose,UA NEG NEG-NEG    Ketones,UA NEG NEG-NEG    Bilirubin,UA NEG NEG-NEG    Blood,UA 2+ (A) NEG-NEG    Urobilinogen,UA NORMAL NORM-NORMAL    Nitrite,UA NEG NEG-NEG    Leukocytes,UA 3+ (A) NEG-NEG    Urine Ascorbic Acid, UA NEG NEG-NEG   URINALYSIS MICROSCOPIC REFLEX TO CULTURE    Collection Time: 08/01/19  8:54 PM   Result Value Ref Range    WBCs,UA 10-20 0 - 2 /HPF    RBCs,UA 0-2 0 - 3 /HPF    Comment,UA       Criteria for reflex to culture are WBC>10, Positive Nitrite, and/or >=+1   leukocytes. If quantity is not sufficient, an addendum will follow.      MucousUA TRACE     Squamous Epithelial Cells 5-10 0 - 5   COMPREHENSIVE METABOLIC PANEL    Collection Time: 08/01/19  8:54 PM   Result Value Ref Range    Sodium 139 137 - 147 MMOL/L    Potassium 3.9 3.5 - 5.1 MMOL/L    Chloride 106 98 - 110 MMOL/L    Glucose 99 70 - 100 MG/DL    Blood Urea Nitrogen 11 7 - 25 MG/DL    Creatinine 4.54 0.4 - 1.00 MG/DL    Calcium 9.4 8.5 - 09.8 MG/DL    Total Protein 7.7 6.0 - 8.0 G/DL    Total Bilirubin 0.2 (L) 0.3 - 1.2 MG/DL    Albumin 4.0 3.5 - 5.0 G/DL    Alk Phosphatase 76 25 - 110 U/L    AST (SGOT) 10 7 - 40 U/L    CO2 24 21 - 30 MMOL/L    ALT (SGPT) 11 7 - 56 U/L    Anion Gap 9 3 - 12    eGFR Non African American >60 >60 mL/min    eGFR African American >60 >60 mL/min   CBC AND DIFF    Collection Time: 08/01/19  8:54 PM   Result Value Ref Range    White Blood Cells 10.1 4.5 - 11.0 K/UL    RBC 4.68 4.0 - 5.0 M/UL    Hemoglobin 13.2 12.0 - 15.0 GM/DL    Hematocrit 11.9 36 - 45 %    MCV 85.3 80 - 100 FL    MCH 28.1 26 -  34 PG    MCHC 33.0 32.0 - 36.0 G/DL    RDW 16.1 11 - 15 %    Platelet Count 227 150 - 400 K/UL    MPV 9.5 7 - 11 FL    Neutrophils 75 41 - 77 %    Lymphocytes 18 (L) 24 - 44 %    Monocytes 4 4 - 12 %    Eosinophils 2 0 - 5 %    Basophils 1 0 - 2 % Absolute Neutrophil Count 7.57 (H) 1.8 - 7.0 K/UL    Absolute Lymph Count 1.83 1.0 - 4.8 K/UL    Absolute Monocyte Count 0.43 0 - 0.80 K/UL    Absolute Eosinophil Count 0.18 0 - 0.45 K/UL    Absolute Basophil Count 0.06 0 - 0.20 K/UL    MDW (Monocyte Distribution Width) 18.6 <20.7   DIRECT EXAM (WET PREP)    Collection Time: 08/01/19  9:03 PM   Result Value Ref Range    Battery Name DIRECT EXAM,WET PREP     Specimen Description VAGINAL  CERVICAL       Special Requests NONE     Direct Exam       NO YEAST SEEN  NO CLUE CELLS SEEN  NO TRICHOMONAS SEEN  MANY  NEUTROPHILS      Report Status FINAL  08/01/2019          Point of Care Testing:  Glucose: 99 (08/01/19 2054)

## 2019-08-03 LAB — CULTURE-URINE W/SENSITIVITY: Lab: >10

## 2019-08-06 ENCOUNTER — Ambulatory Visit (INDEPENDENT_AMBULATORY_CARE_PROVIDER_SITE_OTHER): Payer: Medicaid Other

## 2019-08-06 ENCOUNTER — Other Ambulatory Visit: Payer: Self-pay

## 2019-08-06 DIAGNOSIS — O24410 Gestational diabetes mellitus in pregnancy, diet controlled: Secondary | ICD-10-CM

## 2019-08-06 DIAGNOSIS — O24419 Gestational diabetes mellitus in pregnancy, unspecified control: Secondary | ICD-10-CM

## 2019-08-06 NOTE — Progress Notes (Signed)
[place]  [place]  [id number]       Name: Shantai Tiedeman MRN:  [id number]   Date: 08/06/2019 Age: 38 y.o.     Elle Antionet Leven presents today at [redacted]w[redacted]d gestation for an initial GDM education visit. Pt was ordered glucometer/supplies to test QID, but has not picked up from pharmacy. Admits to drinking soda 3 times a day, but eats bread and pasta in moderation. Discussed diagnosis, healthy eating, and exercise.     Hypoglycemia: Was not assessed at this time    Medications:  . Blood Sugar Diagnostic (BLOOD GLUCOSE TEST) Strip 1 Strip by Does not apply route Four times a day   . Blood-Glucose Meter Misc 1 Units by Does not apply route Four times a day   . Blood-Glucose Meter Misc Glucometer of choice.  Use as directed.   . buprenorphine HCL (SUBUTEX) 2 mg Sublingual Tablet, Sublingual 2 mg by Sublingual route Once a day   . buprenorphine-naloxone (SUBOXONE) 8-2 mg Sublingual Tablet, Sublingual 1.5 Tabs by Sublingual route Once a day    . lancets (UNIVERSAL 1 LANCETS) 26 gauge Misc Fingersticks 4 times daily and as needed.   . Lancets Misc 1 Units by Does not apply route Four times a day   . prenatal vitamin-iron-folate Tablet Take 1 Tab by mouth Once a day        LMP 12/09/2018       Latest A1c: 6%    Assessment & Plan:  - Cut out soda from diet completely  - Educated on FBG and post prandial goals  - Educated on complications associated with untreated GDM    Follow up in 1 week    Liliana Cline, [facility]  08/06/2019, 09:30  Encounter Date: 08/06/2019    I discussed management with the resident/fellow/PharmD. I reviewed the resident/fellow/PharmD note and agree with the documented findings and plan of care except as noted below:      Alvin Critchley, MD

## 2019-08-07 ENCOUNTER — Encounter (INDEPENDENT_AMBULATORY_CARE_PROVIDER_SITE_OTHER): Payer: Self-pay | Admitting: Obstetrics

## 2019-08-07 ENCOUNTER — Ambulatory Visit (INDEPENDENT_AMBULATORY_CARE_PROVIDER_SITE_OTHER): Payer: Medicaid Other | Admitting: Obstetrics

## 2019-08-07 VITALS — BP 154/72 | HR 72 | Temp 96.8°F | Ht 68.0 in | Wt 193.6 lb

## 2019-08-07 DIAGNOSIS — F1190 Opioid use, unspecified, uncomplicated: Secondary | ICD-10-CM

## 2019-08-07 DIAGNOSIS — Z5181 Encounter for therapeutic drug level monitoring: Secondary | ICD-10-CM

## 2019-08-07 DIAGNOSIS — O133 Gestational [pregnancy-induced] hypertension without significant proteinuria, third trimester: Secondary | ICD-10-CM

## 2019-08-07 DIAGNOSIS — O09293 Supervision of pregnancy with other poor reproductive or obstetric history, third trimester: Secondary | ICD-10-CM

## 2019-08-07 DIAGNOSIS — Z8759 Personal history of other complications of pregnancy, childbirth and the puerperium: Secondary | ICD-10-CM

## 2019-08-07 DIAGNOSIS — Z87440 Personal history of urinary (tract) infections: Secondary | ICD-10-CM

## 2019-08-07 DIAGNOSIS — O24410 Gestational diabetes mellitus in pregnancy, diet controlled: Secondary | ICD-10-CM

## 2019-08-07 DIAGNOSIS — Z79899 Other long term (current) drug therapy: Secondary | ICD-10-CM

## 2019-08-07 DIAGNOSIS — O09523 Supervision of elderly multigravida, third trimester: Secondary | ICD-10-CM

## 2019-08-07 DIAGNOSIS — Z141 Cystic fibrosis carrier: Secondary | ICD-10-CM

## 2019-08-07 DIAGNOSIS — F1290 Cannabis use, unspecified, uncomplicated: Secondary | ICD-10-CM

## 2019-08-07 DIAGNOSIS — O99332 Smoking (tobacco) complicating pregnancy, second trimester: Secondary | ICD-10-CM

## 2019-08-07 DIAGNOSIS — O0993 Supervision of high risk pregnancy, unspecified, third trimester: Secondary | ICD-10-CM

## 2019-08-07 DIAGNOSIS — O99320 Drug use complicating pregnancy, unspecified trimester: Secondary | ICD-10-CM

## 2019-08-07 DIAGNOSIS — Z9889 Other specified postprocedural states: Secondary | ICD-10-CM

## 2019-08-07 DIAGNOSIS — E669 Obesity, unspecified: Secondary | ICD-10-CM

## 2019-08-07 DIAGNOSIS — Z833 Family history of diabetes mellitus: Secondary | ICD-10-CM

## 2019-08-07 DIAGNOSIS — F419 Anxiety disorder, unspecified: Secondary | ICD-10-CM

## 2019-08-07 DIAGNOSIS — O99213 Obesity complicating pregnancy, third trimester: Secondary | ICD-10-CM

## 2019-08-07 DIAGNOSIS — R569 Unspecified convulsions: Secondary | ICD-10-CM

## 2019-08-07 HISTORY — DX: Gestational diabetes mellitus in pregnancy, diet controlled: O24.410

## 2019-08-07 HISTORY — DX: Gestational (pregnancy-induced) hypertension without significant proteinuria, third trimester: O13.3

## 2019-08-07 NOTE — Progress Notes (Signed)
Name: Ellen Dillon MRN:  [id number]   Date: 08/07/2019 Age: 38 y.o. 1981/08/16          Subjective:   Ellen Dillon is being seen today for her obstetrical visit.  She is at [redacted]w[redacted]d weeks gestation.       Objective:   Weight: 87.8 kg (193 lb 9.6 oz)  BP (Non-Invasive): (!) 154/72  Fundal height: 35  Preterm Labor: None  Fetal Movement: Present  Edema: Negative  FHR (1): 148            BP (!) 154/72   Pulse 72   Temp 36 C (96.8 F)   Ht 1.727 m (5\' 8" )   Wt 87.8 kg (193 lb 9.6 oz)   LMP 12/09/2018   BMI 29.44 kg/m       Lab Results   Component Value Date    SYPHILISQL Non-reactive 02/27/2019    HIV12 NONREACTIVE 12/25/2011    UCULT NO GROWTH AFTER 2 DAYS INCUBATION. 12/25/2011     Assessment:   Pregnancy [redacted]w[redacted]d weeks    ICD-10-CM    1. Seizure (CMS HCC) R56.9    2. 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 Smoking (tobacco) complicating pregnancy, second trimester O99.332    4. History of pregnancy induced hypertension Z87.59    5. Anxiety F41.9    6. Hx of induced abortion Z98.890    7. Hx of spontaneous abortion, currently pregnant, third trimester O09.293    8. 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 High-risk pregnancy in third trimester O09.93    12. 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 Multigravida of advanced maternal age in third trimester O09.523    53. Hx: UTI (urinary tract infection) Z87.440 [facility]     COMPREHENSIVE METABOLIC PROFILE - [facility] ONLY     URIC ACID     PROTEIN/CREATININE RATIO, URINE, RANDOM     URINE CULTURE   15. Diet controlled White classification A1 gestational diabetes mellitus (GDM) O24.410 [facility]     COMPREHENSIVE METABOLIC PROFILE - [facility] ONLY     URIC ACID     PROTEIN/CREATININE RATIO, URINE, RANDOM     URINE CULTURE     FETAL NON STRESS TEST ([facility] ONLY)   16. Gestational hypertension, third trimester O13.3    17. 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 Ailene Ravel    Plan:   NST reactive, Finishes seeing Dietitian and Education later today. Start log today. Get labs for [place]. RTO1 week.     Christiane Ha, MD

## 2019-08-07 NOTE — Procedures (Signed)
Reactive

## 2019-08-13 ENCOUNTER — Encounter: Admit: 2019-08-13 | Discharge: 2019-08-13

## 2019-08-13 ENCOUNTER — Ambulatory Visit (INDEPENDENT_AMBULATORY_CARE_PROVIDER_SITE_OTHER): Payer: Medicaid Other

## 2019-08-13 ENCOUNTER — Other Ambulatory Visit: Payer: Self-pay

## 2019-08-13 DIAGNOSIS — O24419 Gestational diabetes mellitus in pregnancy, unspecified control: Secondary | ICD-10-CM

## 2019-08-13 DIAGNOSIS — K929 Disease of digestive system, unspecified: Secondary | ICD-10-CM

## 2019-08-13 DIAGNOSIS — M1990 Unspecified osteoarthritis, unspecified site: Secondary | ICD-10-CM

## 2019-08-13 DIAGNOSIS — M549 Dorsalgia, unspecified: Secondary | ICD-10-CM

## 2019-08-13 DIAGNOSIS — I2510 Atherosclerotic heart disease of native coronary artery without angina pectoris: Secondary | ICD-10-CM

## 2019-08-13 DIAGNOSIS — J349 Unspecified disorder of nose and nasal sinuses: Secondary | ICD-10-CM

## 2019-08-13 DIAGNOSIS — J45909 Unspecified asthma, uncomplicated: Secondary | ICD-10-CM

## 2019-08-13 DIAGNOSIS — I10 Essential (primary) hypertension: Secondary | ICD-10-CM

## 2019-08-13 DIAGNOSIS — B009 Herpesviral infection, unspecified: Secondary | ICD-10-CM

## 2019-08-13 DIAGNOSIS — E039 Hypothyroidism, unspecified: Secondary | ICD-10-CM

## 2019-08-13 DIAGNOSIS — F419 Anxiety disorder, unspecified: Secondary | ICD-10-CM

## 2019-08-13 DIAGNOSIS — E119 Type 2 diabetes mellitus without complications: Secondary | ICD-10-CM

## 2019-08-13 DIAGNOSIS — N939 Abnormal uterine and vaginal bleeding, unspecified: Principal | ICD-10-CM

## 2019-08-13 DIAGNOSIS — G43909 Migraine, unspecified, not intractable, without status migrainosus: Secondary | ICD-10-CM

## 2019-08-13 DIAGNOSIS — E785 Hyperlipidemia, unspecified: Secondary | ICD-10-CM

## 2019-08-13 DIAGNOSIS — F329 Major depressive disorder, single episode, unspecified: Secondary | ICD-10-CM

## 2019-08-13 MED ORDER — PROMETHAZINE 12.5 MG PO TAB
12.5 mg | ORAL_TABLET | ORAL | 3 refills | 7.00000 days | Status: AC | PRN
Start: 2019-08-13 — End: ?

## 2019-08-13 MED ORDER — DOCUSATE SODIUM 100 MG PO CAP
200 mg | ORAL_CAPSULE | Freq: Two times a day (BID) | ORAL | 3 refills | Status: AC
Start: 2019-08-13 — End: ?

## 2019-08-13 MED ORDER — HYDROCODONE-ACETAMINOPHEN 5-325 MG PO TAB
1 | ORAL_TABLET | ORAL | 0 refills | 15.00000 days | Status: AC | PRN
Start: 2019-08-13 — End: ?

## 2019-08-13 MED ORDER — POLYETHYLENE GLYCOL 3350 17 GRAM PO PWPK
17 g | Freq: Two times a day (BID) | ORAL | 3 refills | 18.00000 days | Status: AC
Start: 2019-08-13 — End: ?

## 2019-08-13 NOTE — Patient Instructions
For up to date information on the COVID-19 virus, visit the [facility] website. [email or web address]  ??? General supportive care during cold and flu season and infection prevention reminders:    o Wash hands often with soap and water for at least 20 seconds   o Cover your mouth and nose   o Social distancing: try to maintain 6 feet between you and other people   o Stay home if sick and symptoms mild or manageable?  ??? If you must be around people wear a mask    ??? If you are having symptoms of a lower respiratory infection (cough, shortness of breath) and/or fever AND either traveled in last 30 days (internationally or to region of exposure) OR known exposure to patient with COVID19:     o Call your primary care provider for questions or health needs.   ??? Tell your doctor about your recent travel and your symptoms     o In a medical emergency, call 911 or go to the nearest emergency room.    General Instructions  To Contact Me:   Please send a MyChart message to Ob/Gyn or call [phone] to reach your doctor???s nurse team.  To Have Medication Refilled:   Please use the MyChart Refill request or contact your pharmacy directly to request medication refills. Please allow 72 hours.  To Receive Your Test Results:   You will receive your test results in person at your appointment, or by MyChart if you are signed up. We prefer to discuss test results during your appointment time, so please try to have your labs done several days before your next routine visit. If unable to discuss in person, your results will either be sent to you via MyChart, by telephone, or by letter. If you are expecting results and have not heard from my office within 2 weeks of your testing, please send a MyChart message or call my office.  Our Lab (Location, Hours, and Fasting Information)  Lab Location: the 1st floor of the Medical Office Building, directly to the left of the Information Desk. Lab Hours: Monday 6:30 am-6 pm, Tuesday-Friday 7 am-6 pm, and Saturday 7 am-noon.   Fasting for Lab: For fasting labs, please:   Do not eat for at least 8-10 hours before having your labs drawn, drink plenty of water, and make sure to continue your medications as prescribed (unless otherwise specified).  Radiology is on the 2nd floor of the Medical Office Building.  To Schedule an Appointment:   Contact our schedulers at [phone] and select 1. If it has been over a year since your last appointment, please ask for an annual or yearly physical appointment.   Receive Appointment Reminders on Your Cell Phone:   Make sure we have your cell phone number, and text [place] to [phone].  For Urgent Questions:   On nights, weekends or holidays, call the operator at [phone], and ask for the Ob/Gyn on call.   For emergencies, call 911.    We value your feedback and you may receive a survey about your experience with our office. If you had a favorable experience, the only positive survey results that we will receive are if we are rated in the 9 or 10 range out of 10 points.

## 2019-08-13 NOTE — Progress Notes
Date of Service: 08/13/2019    Subjective:             Dorothy Todd is a 38 y.o. female.    38yo A5W0981 who presents for follow up vER/Postop visit.     S/p hysterectomy on 7/27.   Has had persistent bleeding  Was changing a pad three times in a day.   Was recommended to go to the ER for evaluation.   Had CT scan which was unremarkable.   Exam showed minimal bleeding from granulation tissue.   Still having bleeding. Changes a pad 2 times a day because it is mostly saturated.   Has had issues with bleeding in the past.   Still taking some narcotic for pain, unable to take ibuprofen due to other medical issues.   Takes celebrex daily.              Review of Systems   Constitutional: Negative for fatigue, fever and unexpected weight change.   HENT: Negative for voice change.    Respiratory: Negative for cough and shortness of breath.    Cardiovascular: Negative for chest pain and leg swelling.   Gastrointestinal: Negative for abdominal pain, blood in stool, constipation, diarrhea, nausea and vomiting.   Genitourinary: Negative for difficulty urinating, dyspareunia, dysuria, enuresis, frequency, genital sores, hematuria, menstrual problem, pelvic pain, urgency, vaginal bleeding, vaginal discharge and vaginal pain.   Musculoskeletal: Positive for arthralgias and back pain.   Skin: Negative for rash.   Allergic/Immunologic: Positive for environmental allergies.   Neurological: Negative for light-headedness and headaches.   Hematological: Negative for adenopathy. Does not bruise/bleed easily.   Psychiatric/Behavioral: Negative for confusion. The patient is not nervous/anxious.          Objective:         ??? acetaminophen (TYLENOL) 325 mg tablet Take two tablets by mouth every 4 hours as needed for Pain. (Patient not taking: Reported on 07/10/2019)   ??? atenolol (TENORMIN) 25 mg tablet Take 25 mg by mouth daily.   ??? atorvastatin (LIPITOR) 20 mg tablet Take 20 mg by mouth at bedtime daily. ??? azelastine(+) (ASTELIN) 137 mcg (0.1 %) nasal spray Apply two sprays to each nostril as directed twice daily. Use in each nostril as directed   ??? cetirizine (ZYRTEC) 10 mg tablet Take 10 mg by mouth daily as needed.   ??? diclofenac(+) (VOLTAREN) 1 % topical gel Apply two g topically to affected area three times daily as needed.   ??? docusate (COLACE) 100 mg capsule Take one capsule by mouth twice daily.   ??? duloxetine DR (CYMBALTA) 60 mg capsule Take 60 mg by mouth at bedtime daily.   ??? fluticasone propionate (FLONASE) 50 mcg/actuation nasal spray Apply two sprays to each nostril as directed twice daily. Shake bottle gently before using.   ??? fluticasone propionate (FLOVENT HFA) 220 mcg/actuation inhaler Inhale 2 puffs by mouth into the lungs every 12 hours.   ??? furosemide (LASIX) 20 mg tablet Take 20 mg by mouth daily as needed.   ??? gabapentin (NEURONTIN) 300 mg capsule Take 300 mg by mouth every 8 hours.   ??? HYDROcodone/acetaminophen (NORCO) 10/325 mg tablet Take one tablet by mouth every 6 hours as needed for Pain   ??? HYDROcodone/acetaminophen (NORCO) 5/325 mg tablet Take 1 tablet by mouth every 4 hours as needed for Pain   ??? ibuprofen (MOTRIN) 600 mg tablet Take one tablet by mouth every 6 hours as needed. Take with food.   ??? levothyroxine (SYNTHROID) 25  mcg tablet Take 25 mcg by mouth daily 30 minutes before breakfast.   ??? liraglutide(+) (VICTOZA) 0.6 mg/0.1 mL (18 mg/3 mL) pnij Inject 0.6 mg under the skin daily.   ??? lisinopriL-hydrochlorothiazide (ZESTORETIC) 10-12.5 mg tablet Take 1 tablet by mouth every morning.   ??? methylPREDNIsolone (MEDROL DOSEPAK) 4 mg tablet Take medication as directed on package for 6 days. Take with food. (Patient not taking: Reported on 07/10/2019)   ??? ondansetron (ZOFRAN ODT) 4 mg rapid dissolve tablet Dissolve one tablet by mouth every 8 hours as needed for Nausea or Vomiting. Place on tongue to disolve. (Patient not taking: Reported on 07/10/2019) ??? oxyCODONE (ROXICODONE) 5 mg tablet Take one tablet to two tablets by mouth every 4 hours as needed   ??? oxyCODONE (ROXICODONE, OXY-IR) 5 mg tablet Take one tablet by mouth every 4 hours as needed for Pain (Patient not taking: Reported on 07/10/2019)   ??? pantoprazole DR (PROTONIX) 40 mg tablet Take 40 mg by mouth at bedtime daily.   ??? polyethylene glycol 3350 (MIRALAX) 17 g packet Take one packet by mouth daily.   ??? potassium chloride SR (K-DUR) 20 mEq tablet Take 20 mEq by mouth daily as needed. Take with a meal and a full glass of water.    ??? prednisone (DELTASONE) 10 mg tablet Take 2 tablets daily for 5 days, then 1 for 5 days (Patient not taking: Reported on 07/10/2019)   ??? sodium chloride/aloe vera (AYR) nasal spray Apply one spray to two sprays to each nostril as directed four times daily. (Patient not taking: Reported on 07/10/2019)   ??? tiZANidine (ZANAFLEX) 4 mg tablet Take 4 mg by mouth every 8 hours as needed.   ??? VENTOLIN HFA 90 mcg/actuation aerosol inhaler Inhale 2 puffs by mouth into the lungs every 6 hours as needed for Wheezing or Shortness of Breath. Shake well before use.   ??? zolpidem (AMBIEN) 10 mg tablet Take 10 mg by mouth at bedtime as needed for Sleep.     Vitals:    08/13/19 1336   BP: 109/62   BP Source: Arm, Left Upper   Patient Position: Sitting   Pulse: 80   Weight: 120.9 kg (266 lb 9.6 oz)   Height: 168.9 cm (66.5)   PainSc: Six     Body mass index is 42.39 kg/m???.     Physical Exam  Constitutional:       Appearance: Normal appearance.   HENT:      Head: Normocephalic and atraumatic.   Abdominal:      General: There is no distension.      Palpations: Abdomen is soft.      Tenderness: There is no abdominal tenderness.      Comments: Incision c/d/i      Genitourinary:     Comments: Normal external female genitalia, vaginal mucosa pink/rugated/moist, thick pink discharge with small amount of blood at the cervix with granulation tissue, sutures still visualized. Silver nitrate applied to granulation tissue. Digital exam with cuff intact.   Neurological:      General: No focal deficit present.      Mental Status: She is alert and oriented to person, place, and time.   Psychiatric:         Mood and Affect: Mood normal.         Behavior: Behavior normal.              Assessment and Plan:        1. Postop visit: hysterectomy  with persistent bleeding. Silver nitrate applied. Wet prep collected since discharge was thick. Bleeding precautions.     2. Pain: narcotic refilled.     Will plan for return visit 3-4 weeks     Vear Clock MD

## 2019-08-13 NOTE — Progress Notes (Signed)
[place]  [place] [address]       Name: Ellen Dillon MRN:  [id number]   Date: 08/13/2019 Age: 38 y.o.     Caryl Pina [employer] presented for diabetes education and follow up after one week of blood glucose testing. Patient has cut down on soda from 3 to 1 drink/day, but substituted with [employer] Punch. Patient has remained on a lower carb diet, which is normal for her. Patient reports she is staying active.    Hypoglycemia: Denies      Medications:  . Blood Sugar Diagnostic (BLOOD GLUCOSE TEST) Strip 1 Strip by Does not apply route Four times a day   . Blood-Glucose Meter Misc 1 Units by Does not apply route Four times a day   . Blood-Glucose Meter Misc Glucometer of choice.  Use as directed.   . buprenorphine HCL (SUBUTEX) 2 mg Sublingual Tablet, Sublingual 2 mg by Sublingual route Once a day   . buprenorphine-naloxone (SUBOXONE) 8-2 mg Sublingual Tablet, Sublingual 1.5 Tabs by Sublingual route Once a day    . lancets (UNIVERSAL 1 LANCETS) 26 gauge Misc Fingersticks 4 times daily and as needed.   . Lancets Misc 1 Units by Does not apply route Four times a day   . prenatal vitamin-iron-folate Tablet Take 1 Tab by mouth Once a day        LMP 12/09/2018       Avg Bg: Fasting mostly in range, with a few high (156). Tests 1-2 times 1h post prandial (average 111)    Recommendation/Plan:   - Discussed hypoglycemia symptoms and how to manage  - Educated on insulin pen use in case initiated    Follow up in 1 week unless patient requests sooner appointment for insulin management.    Pauline Aus, PharmD    Liliana Cline, [facility]  08/13/2019, 14:41  I discussed patient with the PharmD. See above note for additional details. I agree with the findings and plan as documented.    Hoy Register, MD 08/17/2019, 18:33

## 2019-08-14 ENCOUNTER — Ambulatory Visit: Admit: 2019-08-13 | Discharge: 2019-08-14

## 2019-08-14 ENCOUNTER — Encounter: Admit: 2019-08-14 | Discharge: 2019-08-14

## 2019-08-14 ENCOUNTER — Ambulatory Visit (INDEPENDENT_AMBULATORY_CARE_PROVIDER_SITE_OTHER): Payer: Medicaid Other | Admitting: Obstetrics

## 2019-08-14 VITALS — BP 142/70 | HR 75 | Temp 97.2°F | Resp 16 | Ht 68.5 in | Wt 193.2 lb

## 2019-08-14 DIAGNOSIS — R569 Unspecified convulsions: Secondary | ICD-10-CM

## 2019-08-14 DIAGNOSIS — Z833 Family history of diabetes mellitus: Secondary | ICD-10-CM

## 2019-08-14 DIAGNOSIS — O99323 Drug use complicating pregnancy, third trimester: Secondary | ICD-10-CM

## 2019-08-14 DIAGNOSIS — Z141 Cystic fibrosis carrier: Secondary | ICD-10-CM

## 2019-08-14 DIAGNOSIS — F419 Anxiety disorder, unspecified: Secondary | ICD-10-CM

## 2019-08-14 DIAGNOSIS — F1290 Cannabis use, unspecified, uncomplicated: Secondary | ICD-10-CM

## 2019-08-14 DIAGNOSIS — O09523 Supervision of elderly multigravida, third trimester: Secondary | ICD-10-CM

## 2019-08-14 DIAGNOSIS — Z8759 Personal history of other complications of pregnancy, childbirth and the puerperium: Secondary | ICD-10-CM

## 2019-08-14 DIAGNOSIS — Z3A35 35 weeks gestation of pregnancy: Secondary | ICD-10-CM

## 2019-08-14 DIAGNOSIS — O09293 Supervision of pregnancy with other poor reproductive or obstetric history, third trimester: Secondary | ICD-10-CM

## 2019-08-14 DIAGNOSIS — F1190 Opioid use, unspecified, uncomplicated: Secondary | ICD-10-CM

## 2019-08-14 DIAGNOSIS — O99343 Other mental disorders complicating pregnancy, third trimester: Secondary | ICD-10-CM

## 2019-08-14 DIAGNOSIS — O99332 Smoking (tobacco) complicating pregnancy, second trimester: Secondary | ICD-10-CM

## 2019-08-14 DIAGNOSIS — O0993 Supervision of high risk pregnancy, unspecified, third trimester: Secondary | ICD-10-CM

## 2019-08-14 DIAGNOSIS — Z9889 Other specified postprocedural states: Secondary | ICD-10-CM

## 2019-08-14 DIAGNOSIS — O24410 Gestational diabetes mellitus in pregnancy, diet controlled: Secondary | ICD-10-CM

## 2019-08-14 DIAGNOSIS — O99213 Obesity complicating pregnancy, third trimester: Secondary | ICD-10-CM

## 2019-08-14 DIAGNOSIS — Z87440 Personal history of urinary (tract) infections: Secondary | ICD-10-CM

## 2019-08-14 NOTE — Telephone Encounter
Pt left V/M stating she is needing a return to work letter, would like it emailed to her.    Returned call to pt. pt states she needs the letter for today's date. Pt states she works at [employer] so she does not do any lifting.  Would like letter sent to [email or web address]

## 2019-08-14 NOTE — Telephone Encounter
Returned call to pt. pt notified of Dr.Ahmed's recommendations;  Would recommend NOT returning to work since she was still requiring narcotics for pain. We can evaluate this at her 6 week postop visit. Please let her know.      Pt V/U

## 2019-08-14 NOTE — Procedures (Signed)
Reactive

## 2019-08-14 NOTE — Progress Notes (Signed)
Name: Ellen Dillon MRN:  [id number]   Date: 08/14/2019 Age: 38 y.o. 07-07-1981          Subjective:   Ellen Dillon is being seen today for her obstetrical visit.  She is at [redacted]w[redacted]d weeks gestation.       Objective:   Weight: 87.6 kg (193 lb 3.2 oz)  BP (Non-Invasive): (!) 142/70  Protein: Negative  Glucose: Negative  Ketones: Negative            BP (!) 142/70   Pulse 75   Temp 36.2 C (97.2 F)   Resp 16   Ht 1.74 m (5' 8.5")   Wt 87.6 kg (193 lb 3.2 oz)   LMP 12/09/2018   SpO2 98%   BMI 28.95 kg/m       Lab Results   Component Value Date    SYPHILISQL Non-reactive 02/27/2019    HIV12 NONREACTIVE 12/25/2011    UCULT NO GROWTH AFTER 2 DAYS INCUBATION. 12/25/2011     Assessment:   Pregnancy [redacted]w[redacted]d weeks    ICD-10-CM    1. Seizure (CMS HCC)  R56.9    2. 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 Smoking (tobacco) complicating pregnancy, second trimester  O99.332    4. History of pregnancy induced hypertension  Z87.59    5. Anxiety  F41.9    6. Hx of induced abortion  Z98.890    7. Hx of spontaneous abortion, currently pregnant, third trimester  O09.293    8. 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  O99.213    11. High-risk pregnancy in third trimester  O09.93    12. 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 Multigravida of advanced maternal age in third trimester  O09.523    44. Hx: UTI (urinary tract infection)  Z87.440    15. Advanced maternal age in multigravida, third trimester  O36.523    60. Diet controlled White classification A1 gestational diabetes mellitus (GDM)  O24.410 FETAL NON STRESS TEST (AMB ONLY)     Fastings are nominal and only occasional PP elevation- sher is getting accustomed to the [facility] feedback- re-evaluate next week.    Plan:     Bring glucose log next week.     Christiane Ha, MD

## 2019-08-21 ENCOUNTER — Encounter (INDEPENDENT_AMBULATORY_CARE_PROVIDER_SITE_OTHER): Payer: Self-pay | Admitting: Obstetrics

## 2019-08-21 ENCOUNTER — Other Ambulatory Visit: Payer: Medicaid Other | Attending: Obstetrics

## 2019-08-21 ENCOUNTER — Ambulatory Visit (INDEPENDENT_AMBULATORY_CARE_PROVIDER_SITE_OTHER): Payer: Medicaid Other | Admitting: Obstetrics

## 2019-08-21 ENCOUNTER — Other Ambulatory Visit: Payer: Self-pay

## 2019-08-21 VITALS — BP 132/86 | HR 80 | Temp 97.7°F | Ht 68.0 in | Wt 197.0 lb

## 2019-08-21 DIAGNOSIS — F17210 Nicotine dependence, cigarettes, uncomplicated: Secondary | ICD-10-CM

## 2019-08-21 DIAGNOSIS — Z8759 Personal history of other complications of pregnancy, childbirth and the puerperium: Secondary | ICD-10-CM

## 2019-08-21 DIAGNOSIS — O0993 Supervision of high risk pregnancy, unspecified, third trimester: Secondary | ICD-10-CM

## 2019-08-21 DIAGNOSIS — O09293 Supervision of pregnancy with other poor reproductive or obstetric history, third trimester: Secondary | ICD-10-CM

## 2019-08-21 DIAGNOSIS — Z141 Cystic fibrosis carrier: Secondary | ICD-10-CM

## 2019-08-21 DIAGNOSIS — Z9889 Other specified postprocedural states: Secondary | ICD-10-CM

## 2019-08-21 DIAGNOSIS — Z87440 Personal history of urinary (tract) infections: Secondary | ICD-10-CM

## 2019-08-21 DIAGNOSIS — O24410 Gestational diabetes mellitus in pregnancy, diet controlled: Secondary | ICD-10-CM

## 2019-08-21 DIAGNOSIS — Z8669 Personal history of other diseases of the nervous system and sense organs: Secondary | ICD-10-CM | POA: Insufficient documentation

## 2019-08-21 DIAGNOSIS — O99323 Drug use complicating pregnancy, third trimester: Secondary | ICD-10-CM

## 2019-08-21 DIAGNOSIS — O09523 Supervision of elderly multigravida, third trimester: Secondary | ICD-10-CM

## 2019-08-21 DIAGNOSIS — O99333 Smoking (tobacco) complicating pregnancy, third trimester: Secondary | ICD-10-CM

## 2019-08-21 DIAGNOSIS — F1190 Opioid use, unspecified, uncomplicated: Secondary | ICD-10-CM

## 2019-08-21 DIAGNOSIS — Z3493 Encounter for supervision of normal pregnancy, unspecified, third trimester: Secondary | ICD-10-CM

## 2019-08-21 DIAGNOSIS — Z3A36 36 weeks gestation of pregnancy: Secondary | ICD-10-CM

## 2019-08-21 DIAGNOSIS — Z833 Family history of diabetes mellitus: Secondary | ICD-10-CM

## 2019-08-21 DIAGNOSIS — O133 Gestational [pregnancy-induced] hypertension without significant proteinuria, third trimester: Secondary | ICD-10-CM

## 2019-08-21 DIAGNOSIS — O99213 Obesity complicating pregnancy, third trimester: Secondary | ICD-10-CM

## 2019-08-21 NOTE — Progress Notes (Signed)
Name: Ellen Dillon MRN:  [id number]   Date: 08/21/2019 Age: 38 y.o. 06/21/1981          Subjective:   Ellen Dillon is being seen today for her obstetrical visit.  She is at [redacted]w[redacted]d weeks gestation.       Objective:   Weight: 89.4 kg (197 lb)  BP (Non-Invasive): 132/86            BP 132/86   Pulse 80   Temp 36.5 C (97.7 F)   Ht 1.727 m (5\' 8" )   Wt 89.4 kg (197 lb)   LMP 12/09/2018   BMI 29.95 kg/m       Lab Results   Component Value Date    SYPHILISQL Non-reactive 02/27/2019    HIV12 NONREACTIVE 12/25/2011    UCULT NO GROWTH AFTER 2 DAYS INCUBATION. 12/25/2011     Assessment:   Pregnancy [redacted]w[redacted]d weeks    ICD-10-CM    1. Tobacco smoking complicating pregnancy, third trimester  O99.333    2. Hx of seizure disorder  Z86.69    3. Heroin use complicating pregnancy  C16.606     F11.90    4. Family history of diabetes mellitus in mother  Z32.3    43. Gestational hypertension, third trimester  O13.3    6. Hx: UTI (urinary tract infection)  Z87.440    7. History of pregnancy induced hypertension  Z87.59    8. Hx of spontaneous abortion, currently pregnant, third trimester  O09.293    9. 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  O99.213    10. High-risk pregnancy in third trimester  O09.93    11. Multigravida of advanced maternal age in third trimester  O09.523    65. Diet controlled White classification A1 gestational diabetes mellitus (GDM)  O24.410    13. Hx of induced abortion  Z98.890    61. 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  Doing well.  GBs taken. [place] in 9-10 days and NST next week.     Plan:       Christiane Ha, MD

## 2019-08-22 LAB — GROUP B STREPTOCOCCUS DNA BY PCR: GROUP B STREPTOCOCCUS (GBS) DNA BY PCR: NEGATIVE

## 2019-08-27 ENCOUNTER — Other Ambulatory Visit: Payer: Self-pay

## 2019-08-27 ENCOUNTER — Ambulatory Visit (INDEPENDENT_AMBULATORY_CARE_PROVIDER_SITE_OTHER): Payer: Medicaid Other

## 2019-08-27 DIAGNOSIS — O24410 Gestational diabetes mellitus in pregnancy, diet controlled: Secondary | ICD-10-CM

## 2019-08-27 NOTE — Progress Notes (Signed)
[place]  [place] [address]       Name: Ellen Dillon MRN:  [id number]   Date: 08/27/2019 Age: 38 y.o.     Isabeau Antionet Goodchild presents today at [redacted]w[redacted]d gestation for diabetes education and follow up. She forgot her blood glucose logs today, but reported that her fasting and prandial glucose is in range. Reports testing 4 times daily. Diet has been consistent with portion control and limiting soda.    Hypoglycemia: denies      Medications:  . Blood Sugar Diagnostic (BLOOD GLUCOSE TEST) Strip 1 Strip by Does not apply route Four times a day   . Blood-Glucose Meter Misc 1 Units by Does not apply route Four times a day   . Blood-Glucose Meter Misc Glucometer of choice.  Use as directed.   . buprenorphine HCL (SUBUTEX) 2 mg Sublingual Tablet, Sublingual 2 mg by Sublingual route Once a day   . buprenorphine-naloxone (SUBOXONE) 8-2 mg Sublingual Tablet, Sublingual 1.5 Tabs by Sublingual route Once a day    . lancets (UNIVERSAL 1 LANCETS) 26 gauge Misc Fingersticks 4 times daily and as needed.   . Lancets Misc 1 Units by Does not apply route Four times a day   . prenatal vitamin-iron-folate Tablet Take 1 Tab by mouth Once a day   . TARON PRENATAL-DHA 30 mg iron-1.2 mg-55 mg-265 mg Oral Capsule    . VITAFOL-OB+DHA 65-1-250 mg Oral Combo Pack         LMP 12/09/2018         Avg Bg: *self-reported*     - Fasting 80s-90s     - Post-prandial all <120, one elevated at 136      Recommend 1 week follow up and bring glucose logs to appointment. Discussed hypoglycemia symptoms and management if it should occur.        Pauline Aus, PharmD  08/27/2019, 14:26    Encounter Date: 08/27/2019    I discussed management with the resident/fellow/PharmD. I reviewed the resident/fellow/PharmD note and agree with the documented findings and plan of care except as noted below:      Alvin Critchley, MD

## 2019-08-28 ENCOUNTER — Ambulatory Visit (INDEPENDENT_AMBULATORY_CARE_PROVIDER_SITE_OTHER): Payer: Medicaid Other | Admitting: Obstetrics

## 2019-08-28 VITALS — BP 134/84 | HR 67 | Temp 96.3°F | Ht 68.0 in | Wt 198.8 lb

## 2019-08-28 DIAGNOSIS — O99323 Drug use complicating pregnancy, third trimester: Secondary | ICD-10-CM

## 2019-08-28 DIAGNOSIS — O99343 Other mental disorders complicating pregnancy, third trimester: Secondary | ICD-10-CM

## 2019-08-28 DIAGNOSIS — F419 Anxiety disorder, unspecified: Secondary | ICD-10-CM

## 2019-08-28 DIAGNOSIS — Z3493 Encounter for supervision of normal pregnancy, unspecified, third trimester: Secondary | ICD-10-CM

## 2019-08-28 DIAGNOSIS — F1290 Cannabis use, unspecified, uncomplicated: Secondary | ICD-10-CM

## 2019-08-28 DIAGNOSIS — Z87440 Personal history of urinary (tract) infections: Secondary | ICD-10-CM

## 2019-08-28 DIAGNOSIS — Z833 Family history of diabetes mellitus: Secondary | ICD-10-CM

## 2019-08-28 DIAGNOSIS — O09523 Supervision of elderly multigravida, third trimester: Secondary | ICD-10-CM

## 2019-08-28 DIAGNOSIS — Z3A37 37 weeks gestation of pregnancy: Secondary | ICD-10-CM

## 2019-08-28 DIAGNOSIS — F1190 Opioid use, unspecified, uncomplicated: Secondary | ICD-10-CM

## 2019-08-28 DIAGNOSIS — O0993 Supervision of high risk pregnancy, unspecified, third trimester: Secondary | ICD-10-CM

## 2019-08-28 DIAGNOSIS — E669 Obesity, unspecified: Secondary | ICD-10-CM

## 2019-08-28 DIAGNOSIS — Z8669 Personal history of other diseases of the nervous system and sense organs: Secondary | ICD-10-CM

## 2019-08-28 DIAGNOSIS — Z8759 Personal history of other complications of pregnancy, childbirth and the puerperium: Secondary | ICD-10-CM

## 2019-08-28 DIAGNOSIS — Z141 Cystic fibrosis carrier: Secondary | ICD-10-CM

## 2019-08-28 DIAGNOSIS — F17200 Nicotine dependence, unspecified, uncomplicated: Secondary | ICD-10-CM

## 2019-08-28 DIAGNOSIS — O99333 Smoking (tobacco) complicating pregnancy, third trimester: Secondary | ICD-10-CM

## 2019-08-28 DIAGNOSIS — O09293 Supervision of pregnancy with other poor reproductive or obstetric history, third trimester: Secondary | ICD-10-CM

## 2019-08-28 DIAGNOSIS — O24410 Gestational diabetes mellitus in pregnancy, diet controlled: Secondary | ICD-10-CM

## 2019-08-28 DIAGNOSIS — Z9889 Other specified postprocedural states: Secondary | ICD-10-CM

## 2019-08-28 DIAGNOSIS — O99213 Obesity complicating pregnancy, third trimester: Secondary | ICD-10-CM

## 2019-08-28 DIAGNOSIS — O133 Gestational [pregnancy-induced] hypertension without significant proteinuria, third trimester: Secondary | ICD-10-CM

## 2019-08-28 DIAGNOSIS — O99320 Drug use complicating pregnancy, unspecified trimester: Secondary | ICD-10-CM

## 2019-08-28 LAB — POCT URINE DIPSTICK
BILIRUBIN: NEGATIVE
BLOOD: NEGATIVE
COLOR: NORMAL
GLUCOSE: NEGATIVE
KETONE: NEGATIVE
LEUKOCYTES: NEGATIVE
NITRITE: NEGATIVE
PH: 7
PROTEIN: NEGATIVE
SPECIFIC GRAVITY: 1.025
UROBILINOGEN: 0.2

## 2019-08-28 NOTE — Nursing Note (Signed)
08/28/19 1300   Urine test  (Siemens Multistix 10 SG)   Time collected 1318   Color Yellow   Clarity Clear   Glucose Negative   Bilirubin Negative   Ketones Negative   Urine Specific Gravity 1.025   Blood (urine) Negative   pH 7.0   Protein Negative   Urobilinogen 0.2mg /dL (Normal)   Nitrite Negative   Leukocytes Negative   Lot # [id number]   Expiration Date 08/24/20   Initials rms

## 2019-08-28 NOTE — Progress Notes (Signed)
Name: Ellen Dillon MRN:  [id number]   Date: 08/28/2019 Age: 38 y.o. Oct 02, 1981          Subjective:   Ellen Dillon is being seen today for her obstetrical visit.  She is at [redacted]w[redacted]d weeks gestation.       Objective:   Weight: 90.2 kg (198 lb 12.8 oz)  BP (Non-Invasive): 134/84  Fundal height: 38  Fetal Movement: Present  Presentation: Cephalic  Edema: Negative  FHR (1): 144            BP 134/84   Pulse 67   Temp 35.7 C (96.3 F)   Ht 1.727 m (5\' 8" )   Wt 90.2 kg (198 lb 12.8 oz)   LMP 12/09/2018   SpO2 98%   BMI 30.23 kg/m       Lab Results   Component Value Date    SYPHILISQL Non-reactive 02/27/2019    HIV12 NONREACTIVE 12/25/2011    UCULT NO GROWTH AFTER 2 DAYS INCUBATION. 12/25/2011     Assessment:   Pregnancy [redacted]w[redacted]d weeks    ICD-10-CM    1. Prenatal care, third trimester  Z34.93 POCT URINE DIPSTICK   2. Hx of seizure disorder  Z86.69    3. Heroin use complicating pregnancy  U63.335     F11.90    4. Tobacco smoking complicating pregnancy, third trimester  O99.333    5. History of pregnancy induced hypertension  Z87.59    6. Anxiety  F41.9    7. Hx of induced abortion  Z98.890    8. Hx of spontaneous abortion, currently pregnant, third trimester  O09.293    9. Family history of diabetes mellitus in mother  Z24.3    57. 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  O99.213    12. High-risk pregnancy in third trimester  O09.93    13. 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 Multigravida of advanced maternal age in third trimester  O09.523    87. Hx: UTI (urinary tract infection)  Z87.440    16. Diet controlled White classification A1 gestational diabetes mellitus (GDM)  O24.410    17. Gestational hypertension, third trimester  O13.3        Plan:     Cytotec Monday night.    Christiane Ha, MD

## 2019-08-29 ENCOUNTER — Telehealth (INDEPENDENT_AMBULATORY_CARE_PROVIDER_SITE_OTHER): Payer: Self-pay | Admitting: Obstetrics

## 2019-08-29 NOTE — Telephone Encounter (Signed)
Called patient to inform her of 5pm induction appointment on 9/7. Patient stated she was told Tuesday 9/8. Advised patient I would call Dr. Deon Pilling to confirm. When calling back, patient stated she was unaware as to why she was being induced. Patient became anxious and crying; asking if her GDM, age, and Suboxone use are reason for being induced. Advised ptient I was unsure but I would call Dr. Deon Pilling and let him know she is concerned. Patient stated she thinks she is too early for delivery of baby. Advised patient her GA at her IOL appointment is normal.     Patient's mother came onto phone and asked questions regarding patient support person and protocol at hospital.    Spoke to patient again and advised I would call Dr. Deon Pilling again and let him know her concerns. Patient verbalized understanding and had no further questions or concerns.    Gerrianne Scale, LPN  08/27/7341, 87:68

## 2019-09-01 ENCOUNTER — Other Ambulatory Visit: Payer: Self-pay

## 2019-09-01 ENCOUNTER — Ambulatory Visit (HOSPITAL_COMMUNITY): Admit: 2019-09-01 | Payer: Medicaid Other

## 2019-09-01 ENCOUNTER — Inpatient Hospital Stay
Admission: RE | Admit: 2019-09-01 | Discharge: 2019-09-04 | DRG: 806 | Disposition: A | Discharge status: Home / Self Care | Payer: Medicaid Other | Source: Ambulatory Visit | Attending: Family Medicine | Admitting: Family Medicine

## 2019-09-01 ENCOUNTER — Encounter (HOSPITAL_COMMUNITY): Payer: Self-pay

## 2019-09-01 ENCOUNTER — Inpatient Hospital Stay (HOSPITAL_COMMUNITY): Payer: Medicaid Other | Admitting: Family Medicine

## 2019-09-01 DIAGNOSIS — F17200 Nicotine dependence, unspecified, uncomplicated: Secondary | ICD-10-CM

## 2019-09-01 DIAGNOSIS — F1290 Cannabis use, unspecified, uncomplicated: Secondary | ICD-10-CM | POA: Diagnosis present

## 2019-09-01 DIAGNOSIS — O09523 Supervision of elderly multigravida, third trimester: Secondary | ICD-10-CM

## 2019-09-01 DIAGNOSIS — O99333 Smoking (tobacco) complicating pregnancy, third trimester: Secondary | ICD-10-CM

## 2019-09-01 DIAGNOSIS — O24410 Gestational diabetes mellitus in pregnancy, diet controlled: Secondary | ICD-10-CM

## 2019-09-01 DIAGNOSIS — O24420 Gestational diabetes mellitus in childbirth, diet controlled: Principal | ICD-10-CM | POA: Diagnosis present

## 2019-09-01 DIAGNOSIS — O139 Gestational [pregnancy-induced] hypertension without significant proteinuria, unspecified trimester: Secondary | ICD-10-CM | POA: Diagnosis present

## 2019-09-01 DIAGNOSIS — O99323 Drug use complicating pregnancy, third trimester: Secondary | ICD-10-CM

## 2019-09-01 DIAGNOSIS — Z141 Cystic fibrosis carrier: Secondary | ICD-10-CM

## 2019-09-01 DIAGNOSIS — O99334 Smoking (tobacco) complicating childbirth: Secondary | ICD-10-CM | POA: Diagnosis present

## 2019-09-01 DIAGNOSIS — O134 Gestational [pregnancy-induced] hypertension without significant proteinuria, complicating childbirth: Secondary | ICD-10-CM | POA: Diagnosis present

## 2019-09-01 DIAGNOSIS — F1120 Opioid dependence, uncomplicated: Secondary | ICD-10-CM | POA: Diagnosis present

## 2019-09-01 DIAGNOSIS — Z3A38 38 weeks gestation of pregnancy: Secondary | ICD-10-CM

## 2019-09-01 DIAGNOSIS — Z79899 Other long term (current) drug therapy: Secondary | ICD-10-CM

## 2019-09-01 DIAGNOSIS — O133 Gestational [pregnancy-induced] hypertension without significant proteinuria, third trimester: Secondary | ICD-10-CM

## 2019-09-01 DIAGNOSIS — Z23 Encounter for immunization: Secondary | ICD-10-CM

## 2019-09-01 DIAGNOSIS — O99324 Drug use complicating childbirth: Secondary | ICD-10-CM | POA: Diagnosis present

## 2019-09-01 DIAGNOSIS — F17210 Nicotine dependence, cigarettes, uncomplicated: Secondary | ICD-10-CM | POA: Diagnosis present

## 2019-09-01 HISTORY — DX: Gestational (pregnancy-induced) hypertension without significant proteinuria, unspecified trimester: O13.9

## 2019-09-01 LAB — CBC WITH DIFF
BASOPHIL #: 0 10*3/uL (ref 0.00–0.10)
BASOPHIL %: 1 % (ref 0–3)
EOSINOPHIL #: 0.1 10*3/uL (ref 0.00–0.50)
EOSINOPHIL %: 1 % (ref 0–5)
HCT: 36.1 % (ref 36.0–45.0)
HGB: 11.5 g/dL — ABNORMAL LOW (ref 12.0–15.5)
LYMPHOCYTE #: 1.8 10*3/uL (ref 1.00–4.80)
LYMPHOCYTE %: 21 % (ref 15–43)
MCH: 22.4 pg — ABNORMAL LOW (ref 27.5–33.2)
MCHC: 31.7 g/dL — ABNORMAL LOW (ref 32.0–36.0)
MCV: 70.7 fL — ABNORMAL LOW (ref 82.0–97.0)
MONOCYTE #: 0.7 10*3/uL (ref 0.20–0.90)
MONOCYTE %: 7 % (ref 5–12)
MPV: 9.7 fL (ref 7.4–10.5)
NEUTROPHIL #: 6.3 10*3/uL (ref 1.50–6.50)
NEUTROPHIL %: 71 % (ref 43–76)
PLATELETS: 159 10*3/uL (ref 150–450)
RBC: 5.11 10*6/uL — ABNORMAL HIGH (ref 4.00–5.10)
RDW: 14.3 % (ref 11.0–16.0)
WBC: 8.9 10*3/uL (ref 4.0–11.0)

## 2019-09-01 LAB — TYPE AND SCREEN
ABO/RH(D): O POS
ANTIBODY SCREEN: NEGATIVE

## 2019-09-01 LAB — URINALYSIS, MACROSCOPIC
BILIRUBIN: NEGATIVE mg/dL
GLUCOSE: NORMAL mg/dL
KETONES: NEGATIVE mg/dL
LEUKOCYTES: NEGATIVE WBCs/uL
NITRITE: NEGATIVE
PH: 7 (ref 5.0–7.5)
PROTEIN: 15 mg/dL — AB
SPECIFIC GRAVITY: 1.015 (ref 1.005–1.020)
UROBILINOGEN: <1 mg/dL (ref <=2.0)

## 2019-09-01 LAB — COMPREHENSIVE METABOLIC PROFILE - BMC/JMC ONLY
ALBUMIN/GLOBULIN RATIO: 0.9 (ref 0.8–2.0)
ALBUMIN: 3.1 g/dL — ABNORMAL LOW (ref 3.5–5.0)
ALKALINE PHOSPHATASE: 224 U/L — ABNORMAL HIGH (ref 38–126)
ALT (SGPT): 22 U/L (ref 14–54)
ANION GAP: 8 mmol/L (ref 3–11)
AST (SGOT): 24 U/L (ref 15–41)
BILIRUBIN TOTAL: 0.3 mg/dL (ref 0.3–1.2)
BUN/CREA RATIO: 23 — ABNORMAL HIGH (ref 6–22)
BUN: 12 mg/dL (ref 6–20)
CALCIUM: 9.2 mg/dL (ref 8.6–10.3)
CHLORIDE: 105 mmol/L (ref 101–111)
CO2 TOTAL: 20 mmol/L — ABNORMAL LOW (ref 22–32)
CREATININE: 0.52 mg/dL (ref 0.44–1.00)
ESTIMATED GFR: >60 mL/min/{1.73_m2} (ref >60)
GLUCOSE: 111 mg/dL — ABNORMAL HIGH (ref 70–110)
POTASSIUM: 3.9 mmol/L (ref 3.4–5.1)
PROTEIN TOTAL: 6.6 g/dL (ref 6.4–8.3)
SODIUM: 133 mmol/L — ABNORMAL LOW (ref 136–145)

## 2019-09-01 LAB — SCAN DIFFERENTIAL
PLATELET CLUMPS: ABSENT
PLATELET ESTIMATE: ADEQUATE
PLATELET SATELLITOSIS: ABSENT
WBC MORPHOLOGY COMMENT: NORMAL

## 2019-09-01 LAB — URIC ACID: URIC ACID: 5 mg/dL (ref 2.6–7.0)

## 2019-09-01 LAB — DRUG SCREEN,URINE - BMC/JMC ONLY
AMPHETAMINES URINE: NEGATIVE
BARBITURATES URINE: NEGATIVE
BENZODIAZEPINES URINE: NEGATIVE
CANNABINOIDS URINE: POSITIVE — AB
COCAINE METABOLITES URINE: NEGATIVE
METHADONE URINE: NEGATIVE
OPIATES URINE: NEGATIVE
OXYCODONE URINE: NEGATIVE
PCP URINE: NEGATIVE
TRICYCLIC ANTIDEPRESSANTS URINE: NEGATIVE

## 2019-09-01 LAB — URINALYSIS, MICROSCOPIC

## 2019-09-01 LAB — PROTEIN/CREATININE RATIO, URINE, RANDOM
CREATININE RANDOM URINE: 159 mg/dL
PROTEIN RANDOM URINE: 23 mg/dL — ABNORMAL HIGH (ref <10)
PROTEIN/CREATININE RATIO RANDOM URINE: 0.145 mg/mg (ref <=0.200)

## 2019-09-01 LAB — LDH: LDH: 151 U/L (ref 98–192)

## 2019-09-01 MED ORDER — PRENATAL VIT-IRON-FOLATE TAB WRAPPER
1.0000 | ORAL_TABLET | Freq: Every day | Status: DC
Start: 2019-09-02 — End: 2019-09-04
  Administered 2019-09-02: 09:00:00
  Administered 2019-09-03 – 2019-09-04 (×2): 1 via ORAL
  Filled 2019-09-01 (×2): qty 1

## 2019-09-01 MED ORDER — CARBOPROST TROMETHAMINE 250 MCG/ML INTRAMUSCULAR SOLUTION
250.00 ug | Freq: Once | INTRAMUSCULAR | Status: DC | PRN
Start: 2019-09-01 — End: 2019-09-04

## 2019-09-01 MED ORDER — BUPRENORPHINE HCL 2 MG SUBLINGUAL TABLET
4.00 mg | SUBLINGUAL_TABLET | Freq: Every evening | SUBLINGUAL | Status: DC
Start: 2019-09-01 — End: 2019-09-04
  Administered 2019-09-01 – 2019-09-04 (×4): 4 mg via SUBLINGUAL
  Filled 2019-09-01 (×2): qty 1
  Filled 2019-09-01 (×4): qty 2

## 2019-09-01 MED ORDER — OXYTOCIN 30 UNIT/500 ML IN 0.9 % SODIUM CHLORIDE INTRAVENOUS
333.00 mL/h | Freq: Once | INTRAVENOUS | Status: AC | PRN
Start: 2019-09-01 — End: 2019-09-02
  Administered 2019-09-02: 42 mL/h via INTRAVENOUS
  Administered 2019-09-02: 19.98 [IU]/h via INTRAVENOUS

## 2019-09-01 MED ORDER — CALCIUM 200 MG (AS CALCIUM CARBONATE 500 MG) CHEWABLE TABLET
500.0000 mg | CHEWABLE_TABLET | Freq: Every day | ORAL | Status: DC
Start: 2019-09-01 — End: 2019-09-04
  Administered 2019-09-01 – 2019-09-03 (×4): 500 mg via ORAL
  Administered 2019-09-04: 09:00:00 0 mg via ORAL
  Filled 2019-09-01 (×4): qty 1

## 2019-09-01 MED ORDER — SODIUM PHOSPHATES 19 GRAM-7 GRAM/118 ML ENEMA
133.00 mL | ENEMA | Freq: Once | RECTAL | Status: DC | PRN
Start: 2019-09-01 — End: 2019-09-04

## 2019-09-01 MED ORDER — MISOPROSTOL 100 MCG TABLET
25.00 ug | ORAL_TABLET | ORAL | Status: DC
Start: 2019-09-01 — End: 2019-09-01

## 2019-09-01 MED ORDER — OXYTOCIN 10 UNIT/ML INJECTION SOLUTION
10.00 [IU] | Freq: Once | INTRAMUSCULAR | Status: DC | PRN
Start: 2019-09-01 — End: 2019-09-04
  Filled 2019-09-01: qty 1

## 2019-09-01 MED ORDER — MISOPROSTOL 100 MCG TABLET
25.00 ug | ORAL_TABLET | ORAL | Status: AC
Start: 2019-09-01 — End: 2019-09-01
  Administered 2019-09-01: 25 ug via VAGINAL
  Filled 2019-09-01: qty 1

## 2019-09-01 MED ORDER — LIDOCAINE (PF) 10 MG/ML (1 %) INJECTION SOLUTION
10.00 mL | Freq: Once | INTRAMUSCULAR | Status: DC | PRN
Start: 2019-09-01 — End: 2019-09-02

## 2019-09-01 MED ORDER — MISOPROSTOL 100 MCG TABLET
25.00 ug | ORAL_TABLET | ORAL | Status: AC
Start: 2019-09-01 — End: 2019-09-02
  Administered 2019-09-01: 25 ug via BUCCAL
  Administered 2019-09-02: 0 ug via BUCCAL
  Administered 2019-09-02: 07:00:00
  Filled 2019-09-01: qty 1

## 2019-09-01 MED ORDER — NICOTINE 7 MG/24 HR DAILY TRANSDERMAL PATCH
7.00 mg | MEDICATED_PATCH | Freq: Every evening | TRANSDERMAL | Status: DC
Start: 2019-09-01 — End: 2019-09-03
  Administered 2019-09-01: 20:00:00 7 mg via TRANSDERMAL
  Administered 2019-09-02: 20:00:00
  Administered 2019-09-02: 7 mg via TRANSDERMAL
  Administered 2019-09-03: 12:00:00
  Filled 2019-09-01 (×2): qty 1

## 2019-09-01 MED ORDER — METHYLERGONOVINE 0.2 MG/ML (1 ML) INJECTION SOLUTION
0.20 mg | Freq: Once | INTRAMUSCULAR | Status: DC | PRN
Start: 2019-09-01 — End: 2019-09-04

## 2019-09-01 MED ORDER — MISOPROSTOL 100 MCG TABLET
1000.00 ug | ORAL_TABLET | Freq: Once | ORAL | Status: DC | PRN
Start: 2019-09-01 — End: 2019-09-04
  Filled 2019-09-01: qty 10

## 2019-09-01 MED ORDER — CALCIUM 200 MG (AS CALCIUM CARBONATE 500 MG) CHEWABLE TABLET
500.0000 mg | CHEWABLE_TABLET | Freq: Every day | ORAL | Status: DC
Start: 2019-09-02 — End: 2019-09-01

## 2019-09-01 MED ORDER — TRANEXAMIC ACID 1,000 MG/10 ML (100 MG/ML) INTRAVENOUS SOLUTION
1000.00 mg | Freq: Once | INTRAVENOUS | Status: DC | PRN
Start: 2019-09-01 — End: 2019-09-04

## 2019-09-01 MED ORDER — SODIUM CHLORIDE 0.9 % (FLUSH) INJECTION SYRINGE
10.00 mL | INJECTION | INTRAMUSCULAR | Status: DC | PRN
Start: 2019-09-01 — End: 2019-09-04

## 2019-09-01 MED ORDER — BUPRENORPHINE HCL 2 MG SUBLINGUAL TABLET
6.00 mg | SUBLINGUAL_TABLET | Freq: Two times a day (BID) | SUBLINGUAL | Status: DC
Start: 2019-09-02 — End: 2019-09-04
  Administered 2019-09-02 – 2019-09-04 (×6): 6 mg via SUBLINGUAL
  Filled 2019-09-01: qty 2
  Filled 2019-09-01 (×6): qty 3

## 2019-09-01 NOTE — Nurses Notes (Signed)
Shift report provided to Bubba Hales RN at [phone] with opportunity to ask questions and clarify issues.

## 2019-09-01 NOTE — H&P (Signed)
History & Physical  [facility]  Operated by [facility]  [address] [place] [address]  Dept: [phone]     Ellen Dillon is a 38 y.o. y G4P0030 at [redacted]w[redacted]d  Estimated Date of Delivery: 09/15/19    CC:  Induction of labor     HPI: Ellen Dillon a 38y.o. 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 Anxiety    . Hypertension     due to pregnancy   . 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 Overweight(278.02)    . Panic attack    . [facility]    [facility] 51 26      Past Surgical History:   Procedure Laterality Date   . HX BREAST BIOPSY      x3 cyst    . HX CYST INCISION AND DRAINAGE  LEFT   . HX DILATION AND CURETTAGE  01/04/12     Dilation and Curettage suction        Social History        Socioeconomic History   . Marital status: Single     Spouse name: Not on file   . Number of children: 0   . Years of education: 12.5   . Highest education level: Not on file   Occupational History   . Not on file   Social Needs   . Financial resource strain: Not on file   . Food insecurity     Worry: Not on file     Inability: Not on file   . Transportation needs     Medical: Not on file     Non-medical: Not on file   Tobacco Use   . Smoking status: Current Every  Day Smoker     Packs/day: 0.50     Years: 14.00     Pack years: 7.00     Types: Cigarettes   . Smokeless tobacco: Never Used   Substance and Sexual Activity   . Alcohol use: No   . Drug use: No     Types: Other     Comment: denies/history of opiate pills and cocaine   . Sexual activity: Yes     Partners: Male   Lifestyle   . Physical activity     Days per week: Not on file     Minutes per session: Not on file   . Stress: Not on file   Relationships   . Social Product manager on phone: Not on file     Gets together: Not on file     Attends religious service: Not on file     Active member of club or organization: Not on file     Attends meetings of clubs or organizations: Not on file     Relationship status: Not on file   . Intimate partner violence     Fear of current or ex partner: Not on file     Emotionally abused: Not on file     Physically abused: Not on file     Forced sexual activity: Not on file   Other Topics Concern   . Ability to Walk 1 Flight of Steps without SOB/CP Not Asked   . Routine Exercise Not Asked   . Ability to Walk 2 Flight of Steps without SOB/CP Yes   . Unable to Ambulate Not Asked   . Total Care Not Asked   . Ability To Do Own ADL's Yes   . Uses Walker Not Asked   . Other Activity Level Not Asked   . Uses Cane Not Asked   Social History Narrative   . Not on file       Allergies:  Allergies   Allergen Reactions   . Penicillins      HIVES   . Propoxyphene      HIVES   . Sulfa (Sulfonamides)      HIVES   . Tramadol Swelling        Physical Exam:  Filed Vitals:    09/01/19 1829 09/01/19 1836 09/01/19 1848 09/01/19 1905   BP: 139/77 (!) 146/81 (!) 144/81 (!) 164/86   Pulse: 77 88 74 76   Resp: _0 Temp: 36.3 C (97.3 F)                                FHT: 130 baseline, moderate variability, accelerations present, decelerations absent, overall reassuring: yes, Category: I      TOCO: contractions are absent      General:   alert, cooperative, appears of stated age   Skin:  normal   HEENT: {Sclera clear, anicteric   Lungs:   chest clear, no wheezing, rales, normal symmetric air entry, Heart exam - S1, S2 normal, no murmur, no gallop, rate regular   Heart:   Regular rate and rhythm, S1S2 present or without murmur or extra heart sounds   Extremities: no cyanosis, clubbing or edema present     Neurologic:  negative   Abdomen:  Gravid, nontender   Genitourinary: No excoriation and No Discharge   Mem. Ruptured: No   Presentations: Cephalic by bedside ultrasound  Cervix:     Dilation: 1 cm    Effacement: 50 %    Station: -1    Consistency: Firm    Position: Posterior     Bishop score   Total: 4  Favorable cervix :no    Labs:  Results for orders placed or performed during the hospital encounter of 09/01/19 (from the past 24 hour(s))   CBC/DIFF    Narrative    The following orders were created for panel order CBC/DIFF.  Procedure                               Abnormality         Status                     ---------                               -----------         ------                     CBC WITH RWER[[id number]]                Abnormal            Final result               SCAN DIFFERENTIAL[[id number]]                                Final result                 Please view results for these tests on the individual orders.   URINALYSIS W/RELFLEX TO CULTURE - [facility] ONLY    Specimen: Urine, Site not specified    Narrative    The following orders were created for panel order URINALYSIS W/RELFLEX TO CULTURE - [facility] ONLY.  Procedure                                Abnormality         Status                     ---------                               -----------         ------                     URINALYSIS, MACROSCOPIC[[id number]]                                                       Please view results for these tests on the individual orders.   COMPREHENSIVE METABOLIC PROFILE - [facility] ONLY   Result Value Ref Range    SODIUM 133 (L) 136 - 145 mmol/L    POTASSIUM 3.9 3.4 - 5.1 mmol/L    CHLORIDE 105 101 - 111 mmol/L    CO2 TOTAL 20 (L) 22 - 32 mmol/L    ANION  GAP 8 3 - 11 mmol/L    BUN 12 6 - 20 mg/dL    CREATININE 0.52 0.44 - 1.00 mg/dL    BUN/CREA RATIO 23 (H) 6 - 22    ESTIMATED GFR >60 >60 mL/min/1.23m2    ALBUMIN 3.1 (L) 3.5 - 5.0 g/dL    CALCIUM 9.2 8.6 - 10.3 mg/dL    GLUCOSE 111 (H) 70 - 110 mg/dL    ALKALINE PHOSPHATASE 224 (H) 38 - 126 U/L    ALT (SGPT) 22 14 - 54 U/L    AST (SGOT) 24 15 - 41 U/L    BILIRUBIN TOTAL 0.3 0.3 - 1.2 mg/dL    PROTEIN TOTAL 6.6 6.4 - 8.3 g/dL    ALBUMIN/GLOBULIN RATIO 0.9 0.8 - 2.0    Narrative    Estimated Glomerular Filtration Rate (eGFR) calculated using the CKD-EPI (2009) equation, intended for patients 119years of age and older. If race and/or gender is not documented or "unknown," there will be no eGFR calculation.   LDH   Result Value Ref Range    LDH 151 98 - 192 U/L   URIC ACID   Result Value Ref Range    URIC ACID 5.0 2.6 - 7.0 mg/dL   CBC WITH DIFF   Result Value Ref Range    WBC 8.9 4.0 - 11.0 x10^3/uL    RBC 5.11 (H) 4.00 - 5.10 x10^6/uL    HGB 11.5 (L) 12.0 - 15.5 g/dL    HCT 36.1 36.0 - 45.0 %    MCV 70.7 (L) 82.0 - 97.0 fL    MCH 22.4 (L) 27.5 - 33.2 pg    MCHC 31.7 (L) 32.0 - 36.0 g/dL    RDW 14.3 11.0 - 16.0 %    PLATELETS 159 150 - 450 x10^3/uL    MPV 9.7 7.4 - 10.5 fL    NEUTROPHIL % 71 43 - 76 %    LYMPHOCYTE % 21 15 - 43 %    MONOCYTE % 7 5 - 12 %    EOSINOPHIL % 1 0 - 5 %    BASOPHIL % 1 0 - 3 %    NEUTROPHIL # 6.30 1.50 - 6.50 x10^3/uL    LYMPHOCYTE # 1.80 1.00 - 4.80 x10^3/uL    MONOCYTE  # 0.70 0.20 - 0.90 x10^3/uL    EOSINOPHIL # 0.10 0.00 - 0.50 x10^3/uL    BASOPHIL # 0.00 0.00 - 0.10 x10^3/uL   SCAN DIFFERENTIAL   Result Value Ref Range    PLATELET ESTIMATE Adequate     PLATELET CLUMPS Absent     LARGE PLATELETS Present     PLATELET SATELLITOSIS Absent     HYPOCHROMASIA Moderate     MICROCYTOSIS Slight     WBC MORPHOLOGY COMMENT Normal         Assessment:  AFinleigh Cheongis a 38y.o. G4P0030 at 312w0dy LMP/FTUS that presents for induction of labor.  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    Plan:  - Admit to L&D  - Miso 2564mintravaginal placed; plan for buccal miso overnight for continued cervical ripening  - EFM/Toco intermittent overnight as long as reassuring  - VS q1 hr  - regular diet  as tolerated  - CBC, Type and screen; CMP, LDH, uric acid, urine prot:creatinine  - urine drug screen pending  - FSBS fasting and 1hr postprandial until in labor, then every 2 hours  - continue home subutex 25m AM, 653mnoon, 51m60mvening  - fleets enema at patient request  - nicotine patch  - consult OB Anesthesia for epidural placement if patient requests  - repeat cervical exam Q4h or PRN clinical indication     Discussed treatment plan with attending physician, Dr. WarLeonides Schanz /NaEvonnie DawesO 09/01/2019, 18:29  [place]   We discussed the management of her induction at the time of the visit.  I agree with the assessment and plan.  I was present on the unit at the time of admission.     RicPrimus BravoD 09/01/2019, 22:33

## 2019-09-02 ENCOUNTER — Encounter (HOSPITAL_COMMUNITY): Payer: Self-pay | Admitting: Anesthesiology

## 2019-09-02 ENCOUNTER — Ambulatory Visit (HOSPITAL_COMMUNITY): Payer: Medicaid Other

## 2019-09-02 ENCOUNTER — Encounter (HOSPITAL_COMMUNITY): Payer: Self-pay

## 2019-09-02 DIAGNOSIS — O134 Gestational [pregnancy-induced] hypertension without significant proteinuria, complicating childbirth: Secondary | ICD-10-CM

## 2019-09-02 DIAGNOSIS — O24420 Gestational diabetes mellitus in childbirth, diet controlled: Principal | ICD-10-CM

## 2019-09-02 DIAGNOSIS — O99334 Smoking (tobacco) complicating childbirth: Secondary | ICD-10-CM

## 2019-09-02 DIAGNOSIS — O99324 Drug use complicating childbirth: Secondary | ICD-10-CM

## 2019-09-02 LAB — POC FINGERSTICK GLUCOSE - BMC/JMC (RESULTS)
GLUCOSE, POC: 90 mg/dL (ref 60–100)
GLUCOSE, POC: 91 mg/dL (ref 60–100)
GLUCOSE, POC: 92 mg/dL (ref 60–100)
GLUCOSE, POC: 99 mg/dL (ref 60–100)

## 2019-09-02 MED ORDER — BUPRENORPHINE HCL 2 MG SUBLINGUAL TABLET
2.00 mg | SUBLINGUAL_TABLET | SUBLINGUAL | Status: DC | PRN
Start: 2019-09-02 — End: 2019-09-02
  Administered 2019-09-02 (×2): 2 mg via SUBLINGUAL
  Filled 2019-09-02: qty 1

## 2019-09-02 MED ORDER — FENTANYL-BUPIVACAINE-NACL (PF) 2 MCG/ML-0.125 % EPIDURAL - JMH
EPIDURAL | Status: DC
Start: 2019-09-02 — End: 2019-09-02

## 2019-09-02 MED ORDER — LACTATED RINGERS INTRAVENOUS SOLUTION
INTRAVENOUS | Status: DC
Start: 2019-09-02 — End: 2019-09-02
  Administered 2019-09-02 (×2): via INTRAVENOUS

## 2019-09-02 MED ORDER — FENTANYL-BUPIVACAINE-NACL (PF) 2 MCG/ML-0.125 % EPIDURAL - JMH
EPIDURAL | Status: DC | PRN
Start: 2019-09-02 — End: 2019-09-02
  Administered 2019-09-02 (×2): 14 mL/h via EPIDURAL

## 2019-09-02 MED ORDER — DOCUSATE SODIUM 100 MG CAPSULE
100.0000 mg | ORAL_CAPSULE | Freq: Two times a day (BID) | ORAL | Status: DC
Start: 2019-09-02 — End: 2019-09-04
  Administered 2019-09-02 – 2019-09-04 (×4): 100 mg via ORAL
  Administered 2019-09-04: 21:00:00
  Administered 2019-09-04: 100 mg via ORAL
  Filled 2019-09-02 (×5): qty 1

## 2019-09-02 MED ORDER — LIDOCAINE HCL 10 MG/ML (1 %) INJECTION SOLUTION
Freq: Once | INTRAMUSCULAR | Status: AC | PRN
Start: 2019-09-02 — End: 2019-09-02
  Administered 2019-09-02: 10 mL

## 2019-09-02 MED ORDER — IBUPROFEN 800 MG TABLET
800.0000 mg | ORAL_TABLET | Freq: Three times a day (TID) | ORAL | Status: DC | PRN
Start: 2019-09-02 — End: 2019-09-04
  Administered 2019-09-02 – 2019-09-04 (×5): 800 mg via ORAL
  Filled 2019-09-02 (×5): qty 1

## 2019-09-02 MED ORDER — OXYTOCIN 30 UNIT/500 ML IN 0.9 % SODIUM CHLORIDE INTRAVENOUS
1.0000 m[IU]/min | INTRAVENOUS | Status: DC
Start: 2019-09-02 — End: 2019-09-02
  Administered 2019-09-02: 12 m[IU]/min via INTRAVENOUS
  Administered 2019-09-02: 1 m[IU]/min via INTRAVENOUS
  Administered 2019-09-02: 12:00:00 8 m[IU]/min via INTRAVENOUS
  Administered 2019-09-02: 14 m[IU]/min via INTRAVENOUS
  Administered 2019-09-02: 11:00:00 4 m[IU]/min via INTRAVENOUS
  Administered 2019-09-02: 13:00:00 10 m[IU]/min via INTRAVENOUS
  Administered 2019-09-02: 11:00:00 6 m[IU]/min via INTRAVENOUS
  Administered 2019-09-02: 10:00:00 2 m[IU]/min via INTRAVENOUS
  Filled 2019-09-02: qty 500

## 2019-09-02 MED ORDER — LANOLIN TOPICAL CREAM
TOPICAL_CREAM | CUTANEOUS | Status: DC | PRN
Start: 2019-09-02 — End: 2019-09-04
  Administered 2019-09-02: 22:00:00 via TOPICAL
  Filled 2019-09-02: qty 7

## 2019-09-02 MED ORDER — BUPRENORPHINE HCL 2 MG SUBLINGUAL TABLET
2.00 mg | SUBLINGUAL_TABLET | SUBLINGUAL | Status: DC | PRN
Start: 2019-09-02 — End: 2019-09-04

## 2019-09-02 MED ORDER — DIPHTH,PERTUSSIS(ACEL),TETANUS 2.5 LF UNIT-8 MCG-5 LF/0.5ML IM SYRINGE
0.5000 mL | INJECTION | Freq: Once | INTRAMUSCULAR | Status: AC
Start: 2019-09-02 — End: 2019-09-02
  Administered 2019-09-02: 22:00:00 0.5 mL via INTRAMUSCULAR
  Filled 2019-09-02: qty 0.5

## 2019-09-02 MED ORDER — FENTANYL 2MCG/ML-BUPIVACAINE 0.125% IN NS EPIDURAL INFUSION
INJECTION | Status: AC
Start: 2019-09-02 — End: 2019-09-02
  Administered 2019-09-02: 12 mL/h via EPIDURAL
  Filled 2019-09-02: qty 250

## 2019-09-02 NOTE — Nurses Notes (Signed)
Pt. States legs still feel heavy. Instructed pt. To not get up to bathroom without OB staff. Pt. Verbalized understanding.

## 2019-09-02 NOTE — Nurses Notes (Signed)
Pt. OOB for fist time post delivery. OB staff present. Pt. Ambulated to bathroom with steady gait. Pt voided without difficulties. Pt. Provided instructions on peri-care. Pt. Demonstrated understanding. Pt. Ambulated to room 251 for post partum care.

## 2019-09-02 NOTE — Progress Notes (Signed)
PROGRESS NOTE - Laboring    Subjective:   Feeling more comfortable with epidural.  RN noted some "wetness" when placing Foley, wondering if membranes ruptured.    Objective:     Filed Vitals:    09/02/19 0819 09/02/19 0831 09/02/19 0849 09/02/19 0904   BP:  138/72 139/80 (!) 149/81   Pulse: 82 86 73 76   Resp:  18     Temp:       SpO2: 100%           FHT: 125, moderate variability, accelerations absent, decelerations  absent, overall reassuring yes, Category: I      TOCO: contractions are not reading well on toco      Abdomen  contractions palpate moderate    Cervix:    4 cm / 80 % / -2 station, BoW is not intact - blood tinged fluid, Vertex -- check by Dr Theadore Nan       Assessment:   38 y.o.y G4P0030 at [redacted]w[redacted]d IoL gHTN with pregnancy further complicated by AMA, GDMA1, 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  SROM blood-tinged fluid at around 9am.  Reassuring maternal & fetal status.    Plan:   Start pitocin as able with contraction pattern/monitoring.  Recheck in 2-4 hours or sooner prn clinical condition.    Evaluated patient and discussed treatment plan with attending physician, Dr. Theadore Nan    Evonnie Dawes, DO 09/02/2019, 09:07  [facility]    09/02/2019  I saw and examined the patient.  I reviewed the fellow's note.  I agree with the findings and plan of care as documented in the fellow's note.  Any exceptions/additions are edited/noted.    Darrel Hoover, MD

## 2019-09-02 NOTE — Progress Notes (Signed)
PROGRESS NOTE - Laboring    Subjective:   Ellen Dillon is a 38 y.o.y G4P0030 at [redacted]w[redacted]d. She is here for IoL for AMA, gHTN, GDMA1.    Called to see patient following decel to 60s which lasted 2 minutes followed by slow recovery to baseline over 3 subsequent minutes.  RN repositioned patient, started IVF bolus, and started O2.  Not feeling contractions.  No ROM.    Objective:     Filed Vitals:    09/01/19 2029 09/01/19 2133 09/01/19 2345 09/02/19 0030   BP: 138/81 (!) 149/84 138/68 128/70   Pulse: 64 77 67 59   Resp:       Temp:            FHT: 135, minimal variability, accelerations absent, decelerations  absent, Category: II      TOCO: Irregular, coming every 2 minutes for several contractions then space to every 4 minutes      Abdomen  contractions palpate mild    Cervix:    1 cm / 20 % / -1 station       Assessment/Plan:    38 y.o.y G4P0030 at [redacted]w[redacted]d IoL AMA, gHTN, GDMA1  - Cat II FHT -- improved somewhat with resuscitation efforts however minimal variability persists  - continue maintenance IVF; will d/c oxygen   - continuous external monitoring  - if contractions q53min and persistent NRFHT will use tocolytics    Evonnie Dawes, DO 09/02/2019, 01:15  [place], [place]  09/02/2019, 08:55

## 2019-09-02 NOTE — Nurses Notes (Signed)
Patient laying on arm with blood pressure cuff

## 2019-09-02 NOTE — Progress Notes (Signed)
PROGRESS NOTE - Laboring    Subjective:   Patient feeling comfortable, but continues to feel contractions. Denies any blurry vision or headache.    Objective:     Filed Vitals:    09/02/19 1549 09/02/19 1602 09/02/19 1618 09/02/19 1622   BP: (!) 177/88 (!) 166/88 (!) 170/86 131/65   Pulse: 71 75 74 69   Resp:       Temp:       SpO2:            FHT: 130, moderate variability, accelerations present, decelerations  absent, overall reassuring yes, Category: I      TOCO: contractions are not reading well on toco      Abdomen  contractions palpate moderate    Cervix:    10 cm / 100 % / 0 station, BoW is not intact - blood tinged fluid, Vertex        Assessment:   38 y.o.y G4P0030 at [redacted]w[redacted]d IoL gHTN with pregnancy further complicated by AMA, GDMA1, 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  SROM blood-tinged fluid at around 9am.  Reassuring maternal & fetal status.    Plan:   Continue pitocin with contraction pattern/monitoring.  Blood pressure cuff was adjusted and now read 131/65. Patient is not symptomatic. Will continue to monitor.  Labor progressing appropriately. Fully dilated, but will wait until baby comes down further.  Recheck in 1-2 hours or sooner prn clinical condition.     Evaluated patient and discussed treatment plan with attending physician, Dr. Theadore Nan    Cordelia Poche, MD 09/02/2019, 16:03  [place]    Pt was seen and evaluated by me. BP's noted. Discussed note and patient with resident. Agree with above note.

## 2019-09-02 NOTE — Anesthesia Procedure Notes (Signed)
Lumbar Epidural   Performed by:    Performing Provider:  Janalyn Rouse, DO  Authorizing Provider:  Janalyn Rouse, DO    Indication: pain relief in labor and delivery        Pt location: At bedside  Technique: ( See MAR for exact doses)  Technique/Approach: midline        Needle Level: L4-5  Sterile Skin Prep : sterile gloves and mask     Timeout performed, Emergency drugs and equipment available and anesthesia consent given   Patient position: sitting   Skin local: Lidocaine 1%   Needle/Catheter: Needle type: Tuohy   Needle Gauge: 17 G  Needle length: 3.5 in    Needle insertion depth 8 cm Catheter length in space: 5 cm   Catheter at skin depth: 13 cm  Epidural catheter location: lumbar (1-5)  Number of attempts: 1  Events: no complications,         Dosing:   Medications:  Lidocaine 1% injection, 10 mL   Test Dose: 3 mL,  lidocaine 1.5% with epinephrine 1:200,000       Negative test - not INTRAVENOUS  and Negative test - not Subarachnoid          Epidural Sensory Level Right: T12  Epidural Sensory Level Left: T12  Patient response: comfortable

## 2019-09-02 NOTE — Nurses Notes (Signed)
Shift report provided to Maria Renzelli RN with opportunity to ask questions and clarify issues.

## 2019-09-02 NOTE — Nurses Notes (Signed)
Pt. States legs still feel numb. Pt. Going to eat dinner, then let OB staff know when she's ready to get up to the bathroom. Call light and possessions in reach. Pt's mother at bedside.

## 2019-09-02 NOTE — Progress Notes (Signed)
PROGRESS NOTE - Laboring    Subjective:   Feeling more comfortable with epidural. Patient feels anxious about vaginal delivery.    Objective:     Filed Vitals:    09/02/19 1047 09/02/19 1103 09/02/19 1133 09/02/19 1150   BP: (!) 160/84 (!) 151/85 (!) 144/74 (!) 153/90   Pulse: 62 60 64 65   Resp: 18 18     Temp:       SpO2:            FHT: 130, moderate variability, accelerations present, decelerations  absent, overall reassuring yes, Category: I      TOCO: contractions are not reading well on toco      Abdomen  contractions palpate moderate    Cervix:    6 cm / 80 % / -2 station, BoW is not intact - blood tinged fluid, Vertex        Assessment:   38 y.o.y G4P0030 at [redacted]w[redacted]d IoL gHTN with pregnancy further complicated by AMA, GDMA1, 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  SROM blood-tinged fluid at around 9am.  Reassuring maternal & fetal status.    Plan:   Labor progressing appropriately.  Continue pitocin with contraction pattern/monitoring.  Recheck in 2-4 hours or sooner prn clinical condition.     Evaluated patient and discussed treatment plan with attending physician, Dr. Theadore Nan    Cordelia Poche, MD 09/02/2019, 12:03  [place]    Pt was seen and evaluated by me. Elevated BP's noted. Will continue close monitoring. Discussed note and patient with resident. Agree with above note.

## 2019-09-02 NOTE — Progress Notes (Signed)
PROGRESS NOTE - Laboring    Subjective:   Ellen Dillon is a 38 y.o.y G4P0030 at [redacted]w[redacted]d. She is here for IoL for AMA, gHTN, and GDM A1. Pt is requesting pain meds.  She had subutex for breakthrough pain overnight with little improvement.     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  Made some progress overnight with two doses of misoprostol for cervical ripening and continues to have regular contractions.  Bishop score 8.  Plan:    Anesthesia to see her for epidural placement.   Once epidural in place, hope to be able to monitor her contractions more easily with tocometry.     Plan to start pitocin once we have a sense of her contraction pattern.   Recheck BP once pain controlled.   Recheck in 2-4 hours or prn clinical changes.      Evonnie Dawes, DO 09/02/2019, 08:28  [facility]      09/02/2019  I saw and examined the patient.  I reviewed the fellow's note.  I agree with the findings and plan of care as documented in the fellow's note.  Any exceptions/additions are edited/noted.    Darrel Hoover, MD

## 2019-09-02 NOTE — Anesthesia Preprocedure Evaluation (Signed)
ANESTHESIA PRE-OP EVALUATION  Planned Procedure: ANES - LABOR ANALGESIA  Review of Systems     anesthesia history negative     patient summary reviewed  nursing notes reviewed        Pulmonary  negative pulmonary ROS,  past history of smoking ,   Cardiovascular    Hypertension and Pregnancy induced hypertension ,No peripheral edema,        GI/Hepatic/Renal   negative GI/hepatic/renal ROS,      Endo/Other   neg endo/other ROS,        Neuro/Psych/MS  Chronic opioid use  Hx of opioid abuse and Substance abuse     Cancer  negative hematology/oncology ROS,                    Physical Assessment      Patient summary reviewed and Nursing notes reviewed   Airway       Mallampati: II    TM distance: >3 FB    Neck ROM: full  Mouth Opening: good.  No Facial hair  No Beard  No endotracheal tube present  No Tracheostomy present    Dental       Dentition intact             Pulmonary    Breath sounds clear to auscultation  (-) no rhonchi, no decreased breath sounds, no wheezes, no rales and no stridor     Cardiovascular    Rhythm: regular  Rate: Normal  (-) no friction rub, carotid bruit is not present, no peripheral edema and no murmur     Other findings            Plan  ASA 2     Planned anesthesia type: epidural              Intravenous induction     Anesthesia issues/risks discussed are: Blood Loss, High Neuraxial Block, Failure of Block, Local Anesthetic Systemic Toxicity, Spinal Headache, Aspiration, Cardiac Events/MI and PONV.  Anesthetic plan and risks discussed with patient.          Patient's NPO status is appropriate for Anesthesia.

## 2019-09-02 NOTE — Nurses Notes (Signed)
Patient on side, contractions not tracing very well. Will readjust toco.

## 2019-09-02 NOTE — Care Plan (Signed)
Problem: Bleeding (Labor)  Goal: Hemostasis  Outcome: Outcome Achieved     Problem: Change in Fetal Wellbeing (Labor)  Goal: Stable Fetal Wellbeing  Outcome: Outcome Achieved     Problem: Delayed Labor Progression (Labor)  Goal: Effective Progression to Delivery  Outcome: Outcome Achieved     Problem: Infection (Labor)  Goal: Absence of Infection Signs and Symptoms  Outcome: Outcome Achieved     Problem: Labor Pain (Labor)  Goal: Acceptable Pain Control  Outcome: Outcome Achieved     Problem: Uterine Tachysystole (Labor)  Goal: Normal Uterine Contraction Pattern  Outcome: Outcome Achieved     Problem: Bleeding (Labor)  Goal: Hemostasis  Outcome: Outcome Achieved     Problem: Change in Fetal Wellbeing (Labor)  Goal: Stable Fetal Wellbeing  Outcome: Outcome Achieved     Problem: Delayed Labor Progression (Labor)  Goal: Effective Progression to Delivery  Outcome: Outcome Achieved     Problem: Infection (Labor)  Goal: Absence of Infection Signs and Symptoms  Outcome: Outcome Achieved     Problem: Labor Pain (Labor)  Goal: Acceptable Pain Control  Outcome: Outcome Achieved     Problem: Uterine Tachysystole (Labor)  Goal: Normal Uterine Contraction Pattern  Outcome: Outcome Achieved

## 2019-09-03 ENCOUNTER — Ambulatory Visit (INDEPENDENT_AMBULATORY_CARE_PROVIDER_SITE_OTHER): Payer: Self-pay

## 2019-09-03 LAB — BUPRENORPHINE WITH CONFIRMATION, RANDOM URINE: BUPRENORPHINE: POSITIVE — AB

## 2019-09-03 MED ORDER — ACETAMINOPHEN 500 MG TABLET
1000.00 mg | ORAL_TABLET | Freq: Four times a day (QID) | ORAL | Status: DC | PRN
Start: 2019-09-03 — End: 2019-09-04
  Administered 2019-09-03 – 2019-09-04 (×3): 1000 mg via ORAL
  Filled 2019-09-03 (×3): qty 2

## 2019-09-03 NOTE — Care Plan (Signed)
Problem: Adult Inpatient Plan of Care  Goal: Plan of Care Review  Outcome: Ongoing (see interventions/notes)  Goal: Patient-Specific Goal (Individualized)  Outcome: Ongoing (see interventions/notes)  Goal: Absence of Hospital-Acquired Illness or Injury  Outcome: Ongoing (see interventions/notes)  Goal: Optimal Comfort and Wellbeing  Outcome: Ongoing (see interventions/notes)  Goal: Rounds/Family Conference  Outcome: Ongoing (see interventions/notes)     Problem: Adjustment to Role Transition (Postpartum Vaginal Delivery)  Goal: Successful Maternal Role Transition  Outcome: Ongoing (see interventions/notes)     Problem: Bleeding (Postpartum Vaginal Delivery)  Goal: Hemostasis  Outcome: Ongoing (see interventions/notes)     Problem: Infection (Postpartum Vaginal Delivery)  Goal: Absence of Infection Signs and Symptoms  Outcome: Ongoing (see interventions/notes)     Problem: Pain (Postpartum Vaginal Delivery)  Goal: Acceptable Pain Control  Outcome: Ongoing (see interventions/notes)     Problem: Urinary Retention (Postpartum Vaginal Delivery)  Goal: Effective Urinary Elimination  Outcome: Ongoing (see interventions/notes)

## 2019-09-03 NOTE — Nurses Notes (Signed)
Spoke with Jeneen Rinks from [place]. Case number [id number].

## 2019-09-03 NOTE — Progress Notes (Signed)
PROGRESS NOTE - Post Partum Day 1    Subjective:   The patient feels well. Pain is well controlled with current medications. Pain Scale 1/5. The patient is ambulating well. Patient is voiding without difficulty. Pt is tolerating a regular diet. Baby is feeding via bottle feeding expressed breast milk. Pt denies chest pain, shortness of breath, fever/chills, nausea/vomiting, or calf pain with ambulation.    Objective:     Filed Vitals:    09/02/19 1848 09/02/19 1858 09/03/19 0000 09/03/19 0747   BP: (!) 159/82 (!) 154/74 (!) 145/115 131/85   Pulse: 77 80 67 75   Resp:  '16 16 16   ' Temp:  36.8 C (98.2 F) 36.7 C (98.1 F) 36.5 C (97.7 F)   SpO2:           General:    alert, cooperative, in no distress, appears of stated age   37: appropriate   Abdomen:  fundus firm Below umbilicus   Extremities: No evidence of DVT seen on physical exam.   Labs reviewed including:                Results for orders placed or performed during the hospital encounter of 09/01/19 (from the past 24 hour(s))   POC FINGERSTICK GLUCOSE - [facility] (RESULTS)   Result Value Ref Range    GLUCOSE, POC 91 60 - 100 mg/dl   POC FINGERSTICK GLUCOSE - [facility] (RESULTS)   Result Value Ref Range    GLUCOSE, POC 92 60 - 100 mg/dl   POC FINGERSTICK GLUCOSE - [facility] (RESULTS)   Result Value Ref Range    GLUCOSE, POC 99 60 - 100 mg/dl        Assessment:   Status post Vaginal delivery.    -Patient doing well  Plan:     Continue current care.  Encourage breastfeeding. Lactation consult  Pt ready for discharge today, but will stay with the baby for at least 1 extra day.   -follow up in office in 6wks   -discussed signs/symptoms of infections   -activity limitations   - Patient is interested in quitting smoking. Will provide with quit-line information.   - Birth control options will be discussed with the patient  Rx given: Ibuprofen  Rhogam is not needed before discharge.  MMR is not needed before discharge.  Patient received Tdap     Cordelia Poche, MD  09/03/2019 09:15  PGY-2  [facility]    Encounter Date: 09/01/2019    I saw and examined the patient and discussed management. I reviewed the note and agree with the documented findings and plan of care except as noted below:    Modena Jansky, MD  09/04/2019, 07:56

## 2019-09-04 ENCOUNTER — Encounter (INDEPENDENT_AMBULATORY_CARE_PROVIDER_SITE_OTHER): Payer: Self-pay | Admitting: Obstetrics

## 2019-09-04 DIAGNOSIS — F17200 Nicotine dependence, unspecified, uncomplicated: Secondary | ICD-10-CM

## 2019-09-04 LAB — ADULTERATION CHECK
CREATININE RANDOM URINE: 140 mg/dL
OXIDANT-ADULTERATION: NEGATIVE
PH-ADULTERATION: 6.8 (ref 4.5–9.0)
SPECIFIC GRAVITY-ADULTERATION: 1.016 g/mL (ref 1.005–1.030)

## 2019-09-04 MED ORDER — IBUPROFEN 800 MG TABLET
800.00 mg | ORAL_TABLET | Freq: Three times a day (TID) | ORAL | Status: DC | PRN
Start: 2019-09-04 — End: 2021-01-13

## 2019-09-04 MED ORDER — NICOTINE 21 MG/24 HR DAILY TRANSDERMAL PATCH
21.0000 mg | MEDICATED_PATCH | Freq: Every day | TRANSDERMAL | Status: DC
Start: 2019-09-04 — End: 2019-09-04
  Administered 2019-09-04: 21 mg via TRANSDERMAL
  Administered 2019-09-04: 19:00:00
  Filled 2019-09-04: qty 1

## 2019-09-04 NOTE — Discharge Summary (Signed)
[facility]  DISCHARGE SUMMARY - OBSTETRICS      PATIENT NAME:  Ellen Dillon, Ellen Dillon  MRN:  [id number]  DOB:  August 20, 1981      ENCOUNTER START DATE:  09/01/2019  INPATIENT ADMISSION DATE: 09/01/2019  DISCHARGE DATE:  09/05/2019    ATTENDING PHYSICIAN: No att. providers found  PRIMARY CARE PHYSICIAN: Shelva Majestic, MD     ADMISSION DIAGNOSIS: Induction of labor    WEEKS GESTATION ON ADMISSION:: [redacted]w[redacted]d    DISCHARGE DIAGNOSIS: Term pregnancy - delivered    DISCHARGE MEDICATIONS:     Current Discharge Medication List      START taking these medications.      Details   Ibuprofen 800 mg Tablet  Commonly known as:  MOTRIN   800 mg, Oral, EVERY 8 HOURS PRN  Refills:  0        CONTINUE these medications - NO CHANGES were made during your visit.      Details   buprenorphine HCL 8 mg Tablet, Sublingual  Commonly known as:  SUBUTEX   16 mg, Sublingual, DAILY, Takes 6mg  in the morning, 6mg  at noon, and 4mg  at night.   Refills:  0     prenatal vitamin-iron-folate Tablet   1 Tab, Oral, DAILY  Refills:  0        STOP taking these medications.    Taron Prenatal-DHA 30 mg iron-1.2 mg-55 mg-265 mg Capsule  Generic drug:  PNV39-Iron Fumarate-FA-DSS-DHA            DISCHARGE INSTRUCTIONS:      DISCHARGE INSTRUCTION - DIET     Diet: RESUME HOME DIET      DISCHARGE INSTRUCTION - ACTIVITY     Activity: AS TOLERATED      DISCHARGE INSTRUCTION - MISC    Okay to take Ibuprofen or Motrin or Aleve or Advil as directed from the label separate from the prescribed pain medication in between the time intervals with crackers or food. For example, if you took an oxycodone at 4 pm and the next one is not allowed until 8 pm and you are having discomfort say at 7 pm, you can take the Ibuprofen or Motrin or Aleve or Advil as directed on the label for the time intervals.    This document explains the best ways for you to quit as well as new treatments to help. It also tells about ways to avoid relapses and talks about concerns you may have about  quitting, including weight gain.     NICOTINE: A POWERFUL ADDICTION   If you have tried to quit smoking, you know how hard it can be. It is hard because nicotine is a very addictive drug. For some people, it can be as addictive as heroin or cocaine. Quitting is hard. Usually people make 2 or 3 tries, or more, before finally being able to quit. Each time you try to quit, you can learn about what helps and what hurts. Quitting takes hard work and a lot of effort, but you can quit smoking.     QUITTING SMOKING IS ONE OF THE MOST IMPORTANT THINGS YOU WILL EVER DO:           You will live longer and live better.           Quitting will lower your chance of having a heart attack, stroke, or cancer.           If you are pregnant, quitting smoking will improve your chances of having a  healthy baby.           The people you live with, especially your children, will be healthier.           You will have extra money to spend on things other than cigarettes.   FIVE KEYS FOR QUITTING   Studies have shown that these five steps will help you quit and quit for good. You have the best chances of quitting if you use them together:   1 .Get ready.   2 .Get support.   3 .Learn new skills and behaviors.   4 .Get medication and use it correctly.   5 .Be prepared for relapse or difficult situations.     1. GET READY           Set a quit date.           Change your environment.                Get rid of ALL cigarettes and ashtrays in your home, car, and place of work.                Don't let people smoke in your home.          Review your past attempts to quit. Think about what worked and what did not.          Once you quit, don't smoke, NOT EVEN A PUFF!     2. GET SUPPORT AND ENCOURAGEMENT   Studies have shown that you have a better chance of being successful if you have help. You can get support in many ways:          Tell your family, friends, and coworkers that you are going to quit and want their support.             Ask them not to  smoke around you or leave cigarettes out.   Talk to your health care provider (for example, doctor, dentist, nurse, pharmacist,                 psychologist, or smoking counselor).   Get individual, group, or telephone counseling. The more counseling you have, the better your chances are of quitting. Programs are given at [facility] and [facility]. Call your local health department for information about programs in your area.     3. LEARN NEW SKILLS AND BEHAVIORS   Try to distract yourself from urges to smoke. Talk to someone, go for a walk, or get busy with a task.   When you first try to quit, change your routine. Use a different route to work. Drink tea instead of coffee. Eat breakfast in a different place.    Do something to reduce your stress. Take a hot bath, exercise, or read a book.    Plan something enjoyable to do every day.    Drink a lot of water and other fluids.     4. GET MEDICATION AND USE IT CORRECTLY   There are medications available that can help you quit smoking. Ask your physician what may be suitable for you.     5. BE PREPARED FOR RELAPSE OR DIFFICULT SITUATIONS   Most relapses occur within the first 3 months after quitting. Don't be discouraged if you start smoking again. Remember, most people try several times before they finally quit. Here are some difficult situations to watch for:     Alcohol. Avoid drinking alcohol. Drinking lowers your chances of success.  Other smokers. Being around smoking can make you want to smoke.     Weight gain. Many smokers will gain weight when they quit, usually less than 10 pounds. Eat a healthy diet and stay active. Don't let weight gain distract you from your main goal, quitting smoking. Some quit-smoking medications may help delay weight gain.    Bad mood or depression. There are a lot of ways to improve your mood other than smoking.   If you are having problems with any of these situations, talk to your doctor or other health care provider.          SPECIAL SITUATIONS OR CONDITIONS   Studies suggest that everyone can quit smoking. Your situation or condition can give you a special reason to quit.    Pregnant women/new mothers: By quitting, you protect your baby's health and your own.    Hospitalized patients: By quitting, you reduce health problems and help healing.     Heart attack patients: By quitting, you reduce your risk of a second heart attack.    Lung, head, and neck cancer patients: By quitting, you reduce your chance of a second cancer.    Parents of children and adolescents: By quitting, you protect your children and adolescents from illnesses caused by second-hand smoke.     QUESTIONS TO THINK ABOUT   Think about the following questions before you try to stop smoking. You may want to talk about your answers with your health care provider.   1. Why do you want to quit?    2. When you tried to quit in the past, what helped and what didn't?   3. What will be the most difficult situations for you after you quit? How will you plan to handle them?   4. Who can help you through the tough times? Your family? Friends? Health care provider?   5. What pleasures do you get from smoking? What ways can you still get pleasure if you quit?    Here are some questions to ask your health care provider.   1. How can you help me to be successful at quitting?   2. What medication do you think would be best for me and how should I take it?   3. What should I do if I need more help? wal like? How can I get information on withdrawal?      WORK AND A LOT OF EFFORT, BUT YOU CAN QUIT SMOKING.   Additional Resources: [place] Quitline843-118-3121    http://www.payne.com/     DISCHARGE INSTRUCTION - Postpartum    Nothing in the vagina for 6 weeks (no douche, tampons, intercourse, etc)     If any of the following occur, contact your doctor, or if He/She is not available, contact the emergency room at the Hospital:  - Fever greater than 100.23F or feelings of chills/being  cold  - Foul smelling discharge  - Redness around or discharge from incision or episiotomy sites  - Vaginal bleeding heavier than 2 pads/hour.   - Vaginal bleeding should start to decrease over the next couple of days to week, it can be as much as or less than a period is normal. If bleeding starts to increase please notify your provider.    - Difficulty or pain with urination  - Increasing breast tenderness with any redness, lumps, or discharge  - Excessive pain      - Difficulty breathing  - Pain, redness, warmth, or swelling in your legs  or calf  - If you had an epidural, any pain in the back, bleeding, or headache    - Headache, blood pressures greater than 140/90, vision changes, or right upper abdominal pain.     Please call your doctor if you have any symptoms of feeling down or blue, crying spells, mood swings, or decreased appetite.    Please follow up in the office with your doctor in 2 weeks or earlier as needed    If you have not discussed contraception for after delivery please do so at your 2 week appointment    Gradually increase activity as tolerated, please rest frequently, and limit heavy exercise and lifting to under 10 lbs for at least 2 weeks.     DISCHARGE INSTRUCTION - ADDICTION RESOURCE - KEVIN KNOWLES    Are you or anyone you know dealing with addiction?  Please contact the recovery coordinator at the numbers below.    Dionne MiloKevin Knowles  Community Recovery Services Coordinator  [facility]  [address]  [place], [place] [address]    Office: [phone]  Cell: [phone]  Fax: [phone]       Follow-up Information     Didden, Onalee Huaavid, MD In 6 weeks.    Specialty:  EXTERNAL  Contact information:  [address]  [place] [id number]  [phone]             Ceasar MonsBowman, Geoffrey, MD In 6 weeks.    Specialty:  OBSTETRICS/GYNECOLOGY  Contact information:  [address]  [place] [id number]  [phone]                   SIGNIFICANT LAB:     Lab Results   Component Value Date     WBC 8.9 09/01/2019    HGB 11.5 (L) 09/01/2019    HCT 36.1 09/01/2019    PLTCNT 159 09/01/2019         Procedure:  spontaneous vaginal     Anesthesia:  epidural    Postpartum Complications: second degree perineal laceration    Edinburgh PP Depression score upon admission:       Laural BenesJohnson, Girl [[id number]][[id number]]    Delivery Information    Birth date/time:  09/[birth year]2019 1745  Sex:  Female  Delivery type:  Vaginal, Spontaneous     Newborn Measurements    Weight:  3109 g  Length:  48.3 cm  Head circumference:  33 cm  Chest circumference:  30.5 cm  Abdominal girth:  30.5 cm     Newborn  Apgars    Living status:  Living      Skin color:     Heart rate:     Reflex Irrit:     Muscle tone:     Resp. effort:     Total:      1 Min:     1    2    2    1    2    8     5  Min:     1    2    2    2    2    9     10  Min:      15 Min:      20 Min:        Apgars assigned by:  DR Jimmey RalphPARKER            Feeding Method:  bottle    COURSE IN HOSPITAL: 38 y.o.  G4 now P1 presented to the hospital @[redacted]w[redacted]d  with induction of labor for gestational hypertension and diabetes. 09/15/2019, by Last Menstrual Period. 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 Prenatal labs shows blood Type O POSITIVE, beta Strep negative, rubella immune, VDRL Non-reactive, HBS-AG negative, HIV negative, Sickle Cell N/A, genetic Screen Declined and 1-GTT  105, Normal.   Patient delivered a baby girl via SVD. APGARs were 8 for 9. Patient remained hemodynamically stable following delivery.   On post partum day 2 patient reported minimal lochia, minimal pain. Patient was able to tolerate meals, had bowel movements and had voided.  Patient has been ambulating without any difficulty.   She denied any headache, blurry vision, chest pain, shortness of breath or swelling in wrists or lower extremities.   Patient was deemed stable to be discharged home. She was instructed to follow up her OB/GYN in 4-6  weeks.     CONDITION ON DISCHARGE: Alert, Oriented, and VS Stable    DISCHARGE DISPOSITION:  Home discharge     Cordelia Poche, MD 09/05/2019 13:11  PGY-2  [facility]          Late entry for 09/04/19. I saw and examined the patient.  I reviewed the resident's note.  I agree with the findings and plan of care as documented in the resident's note.  Any exceptions/additions are edited/noted.    Wandra Feinstein, MD

## 2019-09-04 NOTE — Care Plan (Signed)
Problem: Adjustment to Role Transition (Postpartum Vaginal Delivery)  Goal: Successful Maternal Role Transition  Outcome: Ongoing (see interventions/notes)  Intervention: Support Maternal Role Transition  Flowsheets (Taken 09/03/2019 0747 by Birdie Hopes, RN)  Parent/Child Attachment Promotion:   parent/caregiver presence encouraged   participation in care promoted   positive reinforcement provided   rooming-in promoted   strengths emphasized  Supportive Measures:   positive reinforcement provided   self-care encouraged     Problem: Pain (Postpartum Vaginal Delivery)  Goal: Acceptable Pain Control  Outcome: Ongoing (see interventions/notes)  Intervention: Prevent or Manage Pain  Flowsheets (Taken 09/04/2019 0859)  Pain Management Interventions: pain management plan reviewed with patient/caregiver

## 2019-09-04 NOTE — Nurses Notes (Signed)
Patient discharged, rooming in with infant.  AVS reviewed with patient.  A written copy of the AVS and discharge instructions was given to the patient.  Questions sufficiently answered as needed.  Patient encouraged to follow up with PCP as indicated.  In the event of an emergency, patient instructed to call 911 or go to the nearest emergency room.

## 2019-09-04 NOTE — Discharge Summary (Signed)
[facility]  DISCHARGE SUMMARY - OBSTETRICS      PATIENT NAME:  Mitzi HansenJohnson, Zadaya Antionet  MRN:  [id number]  DOB:  05/12/1981      ENCOUNTER START DATE:  09/01/2019  INPATIENT ADMISSION DATE: 09/01/2019  DISCHARGE DATE:  09/04/2019    ATTENDING PHYSICIAN: Alain Marionherry, Mylin Gignac, MD  PRIMARY CARE PHYSICIAN: Shelva Majesticavid Didden, MD     ADMISSION DIAGNOSIS: Induction of labor    WEEKS GESTATION ON ADMISSION:: [id number]    DISCHARGE DIAGNOSIS: Term pregnancy - delivered    DISCHARGE MEDICATIONS:     Current Discharge Medication List      START taking these medications.      Details   Ibuprofen 800 mg Tablet  Commonly known as:  MOTRIN   800 mg, Oral, EVERY 8 HOURS PRN  Refills:  0        CONTINUE these medications - NO CHANGES were made during your visit.      Details   buprenorphine HCL 8 mg Tablet, Sublingual  Commonly known as:  SUBUTEX   16 mg, Sublingual, DAILY, Takes 6mg  in the morning, 6mg  at noon, and 4mg  at night.   Refills:  0        STOP taking these medications.    prenatal vitamin-iron-folate Tablet     Taron Prenatal-DHA 30 mg iron-1.2 mg-55 mg-265 mg Capsule  Generic drug:  PNV39-Iron Fumarate-FA-DSS-DHA            DISCHARGE INSTRUCTIONS:      DISCHARGE INSTRUCTION - DIET     Diet: RESUME HOME DIET      DISCHARGE INSTRUCTION - ACTIVITY     Activity: AS TOLERATED      DISCHARGE INSTRUCTION - MISC    Okay to take Ibuprofen or Motrin or Aleve or Advil as directed from the label separate from the prescribed pain medication in between the time intervals with crackers or food. For example, if you took an oxycodone at 4 pm and the next one is not allowed until 8 pm and you are having discomfort say at 7 pm, you can take the Ibuprofen or Motrin or Aleve or Advil as directed on the label for the time intervals.    This document explains the best ways for you to quit as well as new treatments to help. It also tells about ways to avoid relapses and talks about concerns you may have about quitting, including weight gain.          NICOTINE: A POWERFUL ADDICTION   If you have tried to quit smoking, you know how hard it can be. It is hard because nicotine is a very addictive drug. For some people, it can be as addictive as heroin or cocaine. Quitting is hard. Usually people make 2 or 3 tries, or more, before finally being able to quit. Each time you try to quit, you can learn about what helps and what hurts. Quitting takes hard work and a lot of effort, but you can quit smoking.     QUITTING SMOKING IS ONE OF THE MOST IMPORTANT THINGS YOU WILL EVER DO:           You will live longer and live better.           Quitting will lower your chance of having a heart attack, stroke, or cancer.           If you are pregnant, quitting smoking will improve your chances of having a healthy baby.  The people you live with, especially your children, will be healthier.           You will have extra money to spend on things other than cigarettes.   FIVE KEYS FOR QUITTING   Studies have shown that these five steps will help you quit and quit for good. You have the best chances of quitting if you use them together:   1 .Get ready.   2 .Get support.   3 .Learn new skills and behaviors.   4 .Get medication and use it correctly.   5 .Be prepared for relapse or difficult situations.     1. GET READY           Set a quit date.           Change your environment.                Get rid of ALL cigarettes and ashtrays in your home, car, and place of work.                Don't let people smoke in your home.          Review your past attempts to quit. Think about what worked and what did not.          Once you quit, don't smoke, NOT EVEN A PUFF!     2. GET SUPPORT AND ENCOURAGEMENT   Studies have shown that you have a better chance of being successful if you have help. You can get support in many ways:          Tell your family, friends, and coworkers that you are going to quit and want their support.             Ask them not to smoke around you or leave  cigarettes out.   Talk to your health care provider (for example, doctor, dentist, nurse, pharmacist,                 psychologist, or smoking counselor).   Get individual, group, or telephone counseling. The more counseling you have, the better your chances are of quitting. Programs are given at [facility] and [facility]. Call your local health department for information about programs in your area.     3. LEARN NEW SKILLS AND BEHAVIORS   Try to distract yourself from urges to smoke. Talk to someone, go for a walk, or get busy with a task.   When you first try to quit, change your routine. Use a different route to work. Drink tea instead of coffee. Eat breakfast in a different place.    Do something to reduce your stress. Take a hot bath, exercise, or read a book.    Plan something enjoyable to do every day.    Drink a lot of water and other fluids.     4. GET MEDICATION AND USE IT CORRECTLY   There are medications available that can help you quit smoking. Ask your physician what may be suitable for you.     5. BE PREPARED FOR RELAPSE OR DIFFICULT SITUATIONS   Most relapses occur within the first 3 months after quitting. Don't be discouraged if you start smoking again. Remember, most people try several times before they finally quit. Here are some difficult situations to watch for:     Alcohol. Avoid drinking alcohol. Drinking lowers your chances of success.    Other smokers. Being around smoking can make you want to smoke.  Weight gain. Many smokers will gain weight when they quit, usually less than 10 pounds. Eat a healthy diet and stay active. Don't let weight gain distract you from your main goal, quitting smoking. Some quit-smoking medications may help delay weight gain.    Bad mood or depression. There are a lot of ways to improve your mood other than smoking.   If you are having problems with any of these situations, talk to your doctor or other health care provider.     SPECIAL SITUATIONS OR  CONDITIONS   Studies suggest that everyone can quit smoking. Your situation or condition can give you a special reason to quit.    Pregnant women/new mothers: By quitting, you protect your baby's health and your own.    Hospitalized patients: By quitting, you reduce health problems and help healing.     Heart attack patients: By quitting, you reduce your risk of a second heart attack.    Lung, head, and neck cancer patients: By quitting, you reduce your chance of a second cancer.    Parents of children and adolescents: By quitting, you protect your children and adolescents from illnesses caused by second-hand smoke.     QUESTIONS TO THINK ABOUT   Think about the following questions before you try to stop smoking. You may want to talk about your answers with your health care provider.   1. Why do you want to quit?    2. When you tried to quit in the past, what helped and what didn't?   3. What will be the most difficult situations for you after you quit? How will you plan to handle them?   4. Who can help you through the tough times? Your family? Friends? Health care provider?   5. What pleasures do you get from smoking? What ways can you still get pleasure if you quit?    Here are some questions to ask your health care provider.   1. How can you help me to be successful at quitting?   2. What medication do you think would be best for me and how should I take it?   3. What should I do if I need more help? wal like? How can I get information on withdrawal?      WORK AND A LOT OF EFFORT, BUT YOU CAN QUIT SMOKING.   Additional Resources: [place] Quitline919-355-8387    http://www.payne.com/     DISCHARGE INSTRUCTION - Postpartum    Nothing in the vagina for 6 weeks (no douche, tampons, intercourse, etc)     If any of the following occur, contact your doctor, or if He/She is not available, contact the emergency room at the Hospital:  - Fever greater than 100.47F or feelings of chills/being cold  - Foul smelling  discharge  - Redness around or discharge from incision or episiotomy sites  - Vaginal bleeding heavier than 2 pads/hour.   - Vaginal bleeding should start to decrease over the next couple of days to week, it can be as much as or less than a period is normal. If bleeding starts to increase please notify your provider.    - Difficulty or pain with urination  - Increasing breast tenderness with any redness, lumps, or discharge  - Excessive pain      - Difficulty breathing  - Pain, redness, warmth, or swelling in your legs or calf  - If you had an epidural, any pain in the back, bleeding, or headache    -  Headache, blood pressures greater than 140/90, vision changes, or right upper abdominal pain.     Please call your doctor if you have any symptoms of feeling down or blue, crying spells, mood swings, or decreased appetite.    Please follow up in the office with your doctor in 2 weeks or earlier as needed    If you have not discussed contraception for after delivery please do so at your 2 week appointment    Gradually increase activity as tolerated, please rest frequently, and limit heavy exercise and lifting to under 10 lbs for at least 2 weeks.     DISCHARGE INSTRUCTION - ADDICTION RESOURCE - KEVIN KNOWLES    Are you or anyone you know dealing with addiction?  Please contact the recovery coordinator at the numbers below.    Dionne Milo  Community Recovery Services Coordinator  [facility]  [address]  [place], [place] [address]    Office: [phone]  Cell: [phone]  Fax: [phone]       Follow-up Information     Didden, Onalee Hua, MD In 6 weeks.    Specialty:  EXTERNAL  Contact information:  [address]  [place] [address]  [phone]             Ceasar Mons, MD In 6 weeks.    Specialty:  OBSTETRICS/GYNECOLOGY  Contact information:  [address]  [place] [address]  [phone]                   SIGNIFICANT LAB:     Lab Results   Component Value Date    WBC 8.9 09/01/2019     HGB 11.5 (L) 09/01/2019    HCT 36.1 09/01/2019    PLTCNT 159 09/01/2019         Procedure:  spontaneous vaginal     Anesthesia:  epidural    Postpartum Complications: second degree perineal laceration    Edinburgh PP Depression score upon admission:       Lounell, Fenno Girl [[id number]]    Delivery Information    Birth date/time:  09/02/2019 1745  Sex:  Female  Delivery type:  Vaginal, Spontaneous     Newborn Measurements    Weight:  3109 g  Length:  48.3 cm  Head circumference:  33 cm  Chest circumference:  30.5 cm  Abdominal girth:  30.5 cm     Newborn  Apgars    Living status:  Living      Skin color:     Heart rate:     Reflex Irrit:     Muscle tone:     Resp. effort:     Total:      1 Min:     1    2    2    1    2    8     5  Min:     1    2    2    2    2    9     10  Min:      15 Min:      20 Min:        Apgars assigned by:  DR Jimmey Ralph            Feeding Method:  bottle    COURSE IN HOSPITAL: 38 y.o. G4 now P1 presented to the hospital @[redacted]w[redacted]d  with induction of labor for gestational hypertension and diabetes. 09/15/2019, by Last Menstrual  Period. 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 Prenatal labs shows blood Type O POSITIVE, beta Strep negative, rubella immune, VDRL Non-reactive, HBS-AG negative, HIV negative, Sickle Cell N/A, genetic Screen Declined and 1-GTT  105, Normal.   Patient delivered a baby girl via NSVD. APGARs were 8 and 9 at 1 and 5 min respectively.   Patient remained hemodynamically stable following delivery.   On post partum day 2 patient reported minimal lochia, minimal pain. Patient was able to tolerate meals, had bowel movements and had voided.  Patient has been ambulating without any difficulty.   She denied any headache, blurry vision, chest pain, shortness of breath or swelling in wrists or lower extremities.   Patient was deemed stable to be discharged home. She was instructed to follow up her OB/GYN  in 4-6 weeks.     CONDITION ON DISCHARGE: Alert, Oriented, and VS Stable    DISCHARGE DISPOSITION:  Home discharge     Oren Bracket, [place]  09/04/2019 18:16  [facility]          Late entry for 09/04/19  I saw and examined the patient.  I reviewed the fellow's note.  I agree with the findings and plan of care as documented in the fellow's note.  Any exceptions/additions are edited/noted.    Wandra Feinstein, MD

## 2019-09-04 NOTE — Anesthesia Postprocedure Evaluation (Signed)
Anesthesia Post Op Evaluation    Patient: Ellen Dillon  ANES - LABOR ANALGESIA    Last Vitals:Temperature: 36.2 C (97.1 F) (09/04/19 0759)  Heart Rate: 72 (09/04/19 0759)  BP (Non-Invasive): (!) 144/82 (09/04/19 0759)  Respiratory Rate: 16 (09/04/19 0759)  SpO2: 100 % (09/02/19 0819)    Patient is sufficiently recovered from the effects of anesthesia to participate in the evaluation and has returned to their pre-procedure level.  Patient location during evaluation: bedside       Patient participation: complete - patient participated  Level of consciousness: awake and alert and responsive to verbal stimuli  Multimodal Pain Management: Multimodal analgesia used between 6 hours prior to anesthesia start to PACU discharge  Pain score: 1  Pain management: adequate  Airway patency: patent    Anesthetic complications: no  Cardiovascular status: acceptable  Respiratory status: acceptable  Hydration status: acceptable  Patient post-procedure temperature: Pt Normothermic   PONV Status: Absent

## 2019-09-05 ENCOUNTER — Other Ambulatory Visit (INDEPENDENT_AMBULATORY_CARE_PROVIDER_SITE_OTHER): Payer: Self-pay | Admitting: Family Medicine

## 2019-09-05 LAB — SUBOXONE CONFIRMATORY/DEFINITIVE, URINE, BY LC-MS/MS (PERFORMABLE)
BUPRENORPHINE: 852 ng/mL — ABNORMAL HIGH (ref <5)
NALOXONE: NOT DETECTED ng/mL (ref <5)
NORBUPRENORPHINE: >1000 ng/mL — ABNORMAL HIGH (ref <5)

## 2019-09-05 MED ORDER — NICOTINE 21 MG/24 HR DAILY TRANSDERMAL PATCH
21.0000 mg | MEDICATED_PATCH | Freq: Every day | TRANSDERMAL | 0 refills | Status: DC
Start: 2019-09-05 — End: 2019-09-18

## 2019-09-09 ENCOUNTER — Encounter: Admit: 2019-09-09 | Discharge: 2019-09-09 | Payer: PRIVATE HEALTH INSURANCE

## 2019-09-09 ENCOUNTER — Ambulatory Visit: Admit: 2019-09-09 | Discharge: 2019-09-10 | Payer: MEDICAID

## 2019-09-10 ENCOUNTER — Encounter (INDEPENDENT_AMBULATORY_CARE_PROVIDER_SITE_OTHER): Payer: Self-pay | Admitting: Student in an Organized Health Care Education/Training Program

## 2019-09-10 NOTE — Care Management Notes (Signed)
SS: SW received record request from [place], [employer]. SW confirmed CPS referral was made by [facility] staff. Sent facesheet, H&P, MD prog notes, prenatal notes, L&D notes, nurses notes, care plan, d/c summary, urine drug screen, vitals, and labs to CPSW via secure e-mail this date.

## 2019-09-11 ENCOUNTER — Encounter (INDEPENDENT_AMBULATORY_CARE_PROVIDER_SITE_OTHER): Payer: Self-pay | Admitting: Obstetrics

## 2019-09-16 ENCOUNTER — Encounter (INDEPENDENT_AMBULATORY_CARE_PROVIDER_SITE_OTHER): Payer: Self-pay

## 2019-09-17 ENCOUNTER — Telehealth (INDEPENDENT_AMBULATORY_CARE_PROVIDER_SITE_OTHER): Payer: Self-pay

## 2019-09-17 NOTE — Telephone Encounter (Signed)
Call to patient regarding a possible transfer into our [place] program, per referral. In speaking with patient, she has decided to stay in her current program at [facility]. Pt encouraged to call if she should ever change her mind.

## 2019-09-18 ENCOUNTER — Other Ambulatory Visit: Payer: Self-pay

## 2019-09-18 ENCOUNTER — Ambulatory Visit (INDEPENDENT_AMBULATORY_CARE_PROVIDER_SITE_OTHER): Payer: Medicaid Other

## 2019-09-18 ENCOUNTER — Encounter (INDEPENDENT_AMBULATORY_CARE_PROVIDER_SITE_OTHER): Payer: Self-pay | Admitting: Obstetrics

## 2019-09-18 ENCOUNTER — Encounter (INDEPENDENT_AMBULATORY_CARE_PROVIDER_SITE_OTHER): Payer: Self-pay

## 2019-09-18 DIAGNOSIS — O139 Gestational [pregnancy-induced] hypertension without significant proteinuria, unspecified trimester: Secondary | ICD-10-CM

## 2019-09-18 DIAGNOSIS — O24439 Gestational diabetes mellitus in the puerperium, unspecified control: Secondary | ICD-10-CM

## 2019-09-18 NOTE — Progress Notes (Signed)
[facility]  [address]  [place], [place] [address]  [phone]    Postpartum Clinic Visit    Patient: Ellen Dillon  DOB: 10/18/1981  PCP: Alphonsa Gin, MD  Date of Service: 09/18/19    CC: Postpartum visit    Subjective:  Ellen Dillon is a 38 y.o. female who presents 2 weeks post partum following a spontaneous vaginal delivery. I have fully reviewed the prenatal and intrapartum course.     The delivery was at [redacted]w[redacted]d, planned IOL for GDMA1 and gestational HTN. 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   Outcome: NSVD.    Anesthesia: Epidural.    Postpartum course has been uncomplicated.    Baby's course has been doing well without problems. Baby is feeding bottle  Bleeding staining only.   Bowel function is normal. Bladder function is normal. Patient is not sexually active.     Contraception method is none.   Postpartum depression screening: negative  Edinburgh PP Depression Scale (filled out in office.): Score: 3/30    OB History   Gravida Para Term Preterm AB Living   4 1 1   3 1    SAB TAB Ectopic Multiple Live Births   1 2   0 1      # Outcome Date GA Lbr Len/2nd Weight Sex Delivery Anes PTL Lv   4 Term 09/02/19 [redacted]w[redacted]d 08:18 / 00:27 3.109 kg (6 lb 13.7 oz) F Vag-Spont EPI N LIV   3 SAB            2 TAB            1 TAB              Review of Systems  Constitutional: negative for fevers, chills  Eyes: negative for vision changes  Respiratory: negative for cough, shortness of breath  CV: negative for chest pain dizziness  GI: negative for abd pain, nausea/vomiting, bowel changes  Genitourinary: negative for dysuria, hematuria  Neurological: negative for headaches  Behavioral/Psych: negative for anxiety, depression    Objective:    BP (!) 142/82   Pulse 60   Temp 37.1 C (98.7 F) (Oral)   Resp 16   Ht 1.715 m (5' 7.5")   Wt 81.2 kg (179 lb)   LMP 12/09/2018   SpO2 97%   BMI 27.62 kg/m   Recheck: BP 132/80    General: no acute  distress  HEENT: conjunctiva clear, PERLA  Cardiovascular: regular rate and rhythm, no murmurs auscultated  Lungs: clear to auscultation bilaterally, good respiratory effort, no crackles/rales/wheezes/rhonchi  Abdomen: soft, non-distended, normal bowel sounds, no tenderness to palpation  Extremities: no cyanosis or edema  Skin: warm, dry  Neuro: CN II-XII grossly normal, alert and oriented x3  Psych: normal affect and behavior    Assessment/Plan:  Ellen Dillon is a 38 y.o. female presenting for post partum visit. Normal postpartum course with no concerns.     Gestational HTN  - [place] [address] and 132/80 today on recheck  - Pt does have BP cuff at home  - Counseled on keeping track of BP and letting me know if persistently elevated    GDMA1  - Recheck A1c at 3 months PP    Contraception   - Discussed options for contraception. Patient deciding between IUD vs. Nexplanon. Counseled on abstinence until decided.      Follow-up: 4 weeks    Patient discussed with attending, Dr. Leonides Schanz.  Sandi Mealy, MD   09/18/2019, 09:15  Resident Physician, PGY-2  Family Medicine       Encounter Date: 09/18/2019    I saw and examined the patient and discussed management. I reviewed the note and agree with the documented findings and plan of care except as noted below:    Liliane Channel, MD  10/01/2019, 12:10

## 2019-09-18 NOTE — Nursing Note (Signed)
09/18/19 0900   Edinburgh Postnatal Depression Scale:  In the Past 7 Days   I have been able to laugh and see the funny side of things. 0   I have looked forward with enjoyment to things. 0   I have blamed myself unnecessarily when things went wrong. 2   I have been anxious or worried for no good reason. 0   I have felt scared or panicky for no good reason. 0   Things have been getting on top of me. 1   I have been so unhappy that I have had difficulty sleeping. 0   I have felt sad or miserable. 0   I have been so unhappy that I have been crying. 0   The thought of harming myself has occurred to me. 0   Edinburgh Postnatal Depression Scale Total 3

## 2019-09-18 NOTE — Nursing Note (Signed)
Chief Complaint:   Chief Complaint            Post Partum Exam         Functional Health Screen  Functional Health Screening:         BP (!) 142/82   Pulse 60   Temp 37.1 C (98.7 F) (Oral)   Resp 16   Ht 1.715 m (5' 7.5")   Wt 81.2 kg (179 lb)   LMP 12/09/2018   SpO2 97%   BMI 27.62 kg/m       Social History     Tobacco Use   Smoking Status Current Every Day Smoker   . Packs/day: 0.50   . Years: 14.00   . Pack years: 7.00   . Types: Cigarettes   Smokeless Tobacco Never Used     Patient Health Rating     In general, would you say your health is:: Very Good (7-8)  How confident are you that you can control and manage most of your health problems ?: Very Confident  Depression Screening  PHQ Questionnaire     Allergies:  Allergies   Allergen Reactions   . Penicillins      HIVES   . Propoxyphene      HIVES   . Sulfa (Sulfonamides)      HIVES   . Tramadol Swelling     Medication History  Reviewed for OTC medication and any new medications, provider will review medication history  Results through Enter/Edit  No results found for this or any previous visit (from the past 24 hour(s)).  POCT Results  Care Team  Patient Care Team:  Didden, Shanon Brow, MD as PCP - General (EXTERNAL)  Immunizations - last 24 hours     None        Marzella Schlein, CMA  09/18/2019, 09:05

## 2019-10-08 ENCOUNTER — Encounter: Admit: 2019-10-08 | Discharge: 2019-10-08 | Payer: PRIVATE HEALTH INSURANCE

## 2019-10-08 DIAGNOSIS — J324 Chronic pansinusitis: Secondary | ICD-10-CM

## 2019-10-08 MED ORDER — AZELASTINE 137 MCG (0.1 %) NA SPRA
2 | Freq: Two times a day (BID) | NASAL | 3 refills | 50.00000 days | Status: DC
Start: 2019-10-08 — End: 2020-06-17

## 2019-10-16 ENCOUNTER — Other Ambulatory Visit: Payer: Self-pay

## 2019-10-16 ENCOUNTER — Encounter (INDEPENDENT_AMBULATORY_CARE_PROVIDER_SITE_OTHER): Payer: Self-pay | Admitting: Student in an Organized Health Care Education/Training Program

## 2019-10-16 ENCOUNTER — Ambulatory Visit (INDEPENDENT_AMBULATORY_CARE_PROVIDER_SITE_OTHER): Payer: Medicaid Other | Admitting: Student in an Organized Health Care Education/Training Program

## 2019-10-16 VITALS — BP 124/76 | HR 57 | Temp 97.5°F | Ht 68.0 in | Wt 189.4 lb

## 2019-10-16 DIAGNOSIS — F17200 Nicotine dependence, unspecified, uncomplicated: Secondary | ICD-10-CM

## 2019-10-16 DIAGNOSIS — Z8632 Personal history of gestational diabetes: Secondary | ICD-10-CM

## 2019-10-16 DIAGNOSIS — Z309 Encounter for contraceptive management, unspecified: Secondary | ICD-10-CM

## 2019-10-16 DIAGNOSIS — Z8759 Personal history of other complications of pregnancy, childbirth and the puerperium: Secondary | ICD-10-CM

## 2019-10-16 DIAGNOSIS — F1990 Other psychoactive substance use, unspecified, uncomplicated: Secondary | ICD-10-CM

## 2019-10-16 DIAGNOSIS — Z23 Encounter for immunization: Secondary | ICD-10-CM

## 2019-10-16 MED ORDER — ETONOGESTREL 0.12 MG-ETHINYL ESTRADIOL 0.015 MG/24 HR VAGINAL RING
1.0000 | VAGINAL_RING | Freq: Once | VAGINAL | 3 refills | Status: AC
Start: 2019-10-16 — End: 2019-10-16

## 2019-10-16 NOTE — Nursing Note (Signed)
Chief Complaint:   Chief Complaint            Establish Care     Follow-up 6 weekpostpartum follow up        [place] Screening:    Patient is under 18:  No  Have you had a recent unexplained weight loss or gain?:  No  Because we are aware of abuse and domestic violence today, we ask all patients: Are you being hurt, hit, or frightened by anyone at your home or in your life?:  No  Do you have any basic needs within your home that are not being met? (such as Food, Shelter, Games developer, Transportation):  No  Patient is under 18 and therefore has no Advance Directives:  No       BP 124/76   Pulse 57   Temp 36.4 C (97.5 F) (Thermal Scan)   Ht 1.727 m ('5\' 8"' )   Wt 85.9 kg (189 lb 6.4 oz)   LMP 12/09/2018   SpO2 100%   BMI 28.80 kg/m       Social History     Tobacco Use   Smoking Status Current Every Day Smoker   . Packs/day: 0.50   . Years: 14.00   . Pack years: 7.00   . Types: Cigarettes   Smokeless Tobacco Never Used     Patient Health Rating           Depression Screening  PHQ Questionnaire     Allergies:  Allergies   Allergen Reactions   . Penicillins      HIVES   . Propoxyphene      HIVES   . Sulfa (Sulfonamides)      HIVES   . Tramadol Swelling     Medication History  Reviewed for OTC medication and any new medications, provider will review medication history  Results through Enter/Edit  No results found for this or any previous visit (from the past 24 hour(s)).  POCT Results  Care Team  Patient Care Team:  Christ Kick, DO as PCP - General (FAMILY MEDICINE)  Immunizations - last 24 hours     None        Owens Loffler, [place]  10/16/2019, 10:00

## 2019-10-16 NOTE — Progress Notes (Signed)
[facility]ILY [facility]ICINE, [place]  [place] [address]       Name: Konstantina Nachreiner MRN:  [id number]   Date: 10/16/2019 Age: 38 y.o.     CC:   Chief Complaint   Patient presents with   . Establish Care   . Follow-up     6 weekpostpartum follow up     SUBJECTIVE:  This is a patient that is new to me that is here for a 6 week post-partum visit    38 y.o.    Gravida:4    Para:1       Living Children 1     Infant:Female      Weight: 3109 g    Apgars: 14min 8    63mins 9     Delivery:  Date 09/02/2019      Spontaneous vaginal delivery after IOL at 38 weeks    2nd degree perineal laceration that was repaired     Discharge date: 09/04/2019   Pregnancy complicated by opioid use disorder, tobacco use disorder, AMA, gestational DM A1 (diet controlled), and gestational HTN (not requiring medication)   Infant Feeding: bottle   Birth control: interested in [place] flow/Menses: originally stopped after 7 weeks post-partum, then had bleeding again 1 week later, which has now lasted 4 days but is spotting; she denies any vaginal discharge, vaginal or pelvic pain, fatigue, dyspnea   Sexual activity: No   Dyspareunia: n/a   Bladder complaints: none   Bowel complaints: slightly constipated due to Suboxone but doing well   No HA, RUQ pain, vision changes, chest pain, SOB, leg pain, VB > 1pph, pelvic or abdominal pain. Feels well in general.     Postpartum Depression Screening   Adjusting to demands of motherhood: Yes  (clarify) she is very happy   [place] Postpartum Depression Scale: 2    Tobacco use d/o -- interested in quitting -- already has received info on Quit Line    OBJECTIVE:  GENERAL: NAD. NT  HEENT: mmm.   NECK: no LAD. No thyroid enlargement, masses, tenderness, or fullness.  LUNGS: CTAB  CVS: RRR  BACK: no CVA tenderness.   ABDOMEN: BS+ NTND. No rebound guarding or masses  EXT: no edema  NEURO: CN 2-12 intact. Gait normal.   GU/PELVIC: Uterus nonenlarged  nontender.    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

## 2019-10-20 ENCOUNTER — Encounter (INDEPENDENT_AMBULATORY_CARE_PROVIDER_SITE_OTHER): Payer: Self-pay

## 2019-11-18 IMAGING — CR CHEST
2 series · 2 of 2 positions shown · non-contrast
Comparison: none

[chest pa x-wise]
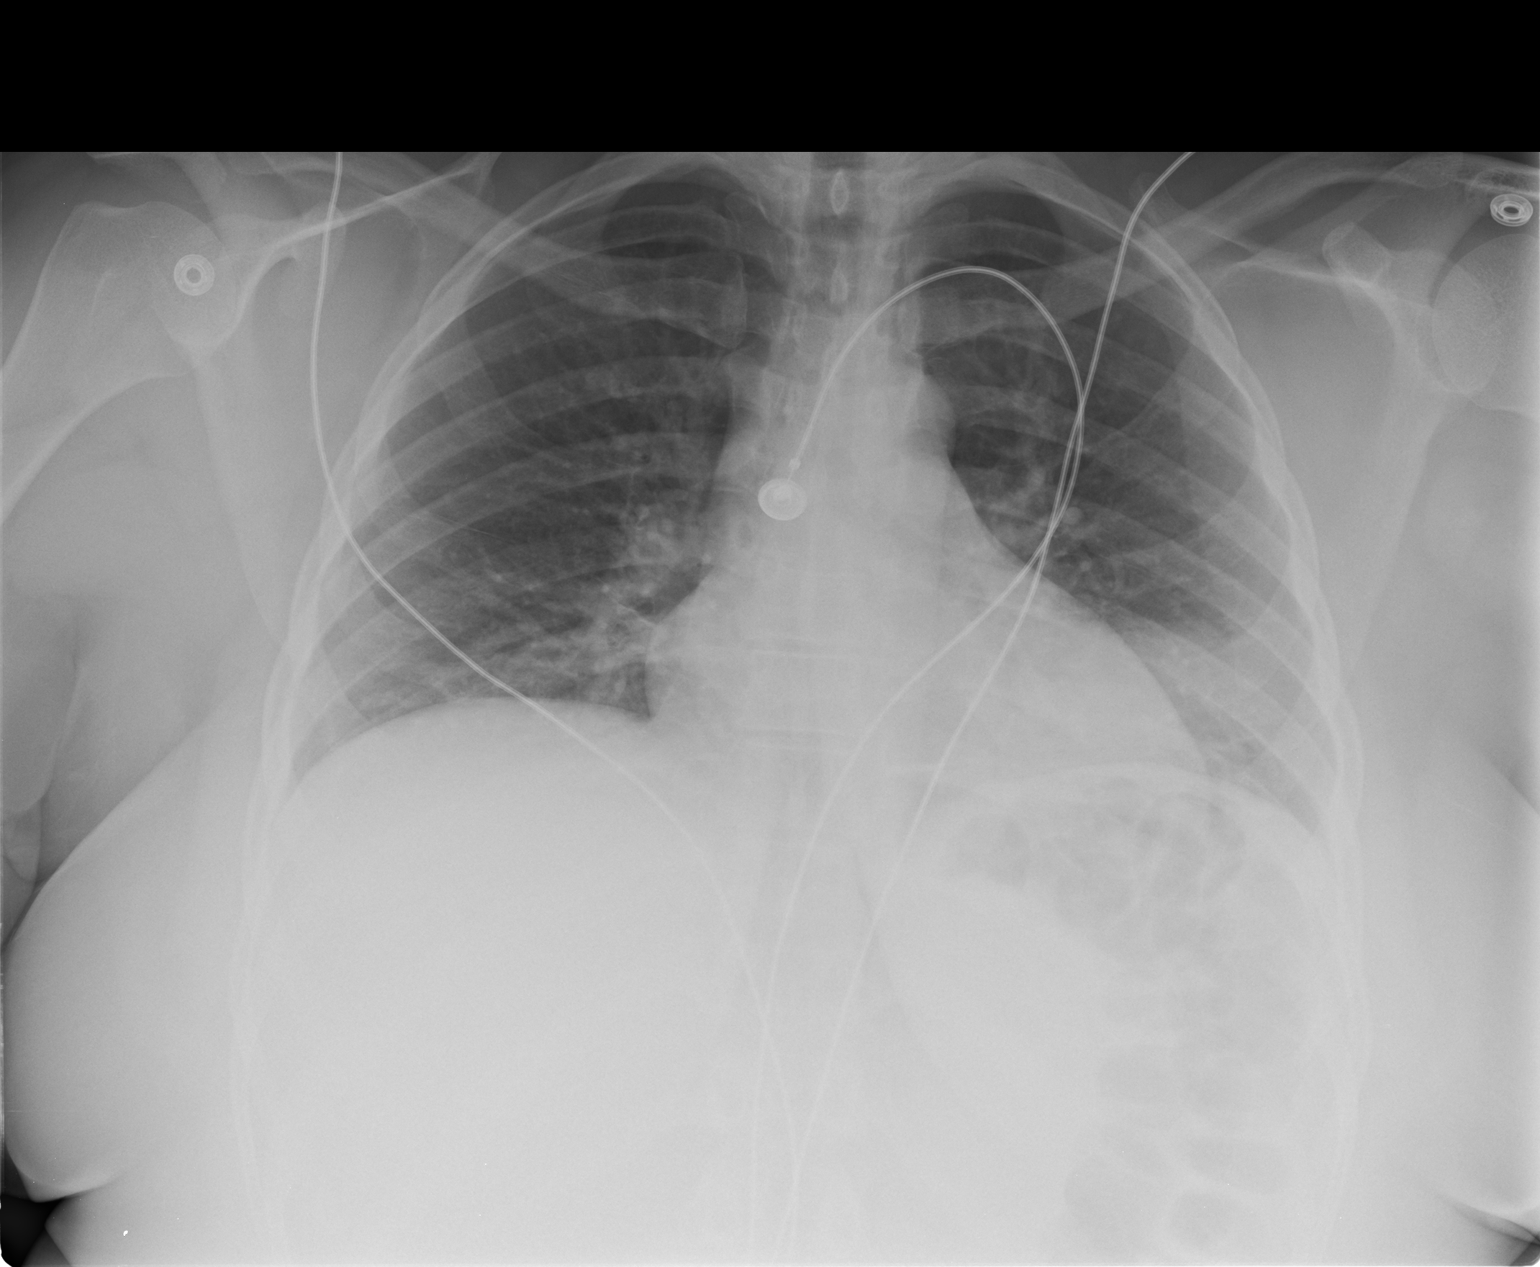

[chest lat]
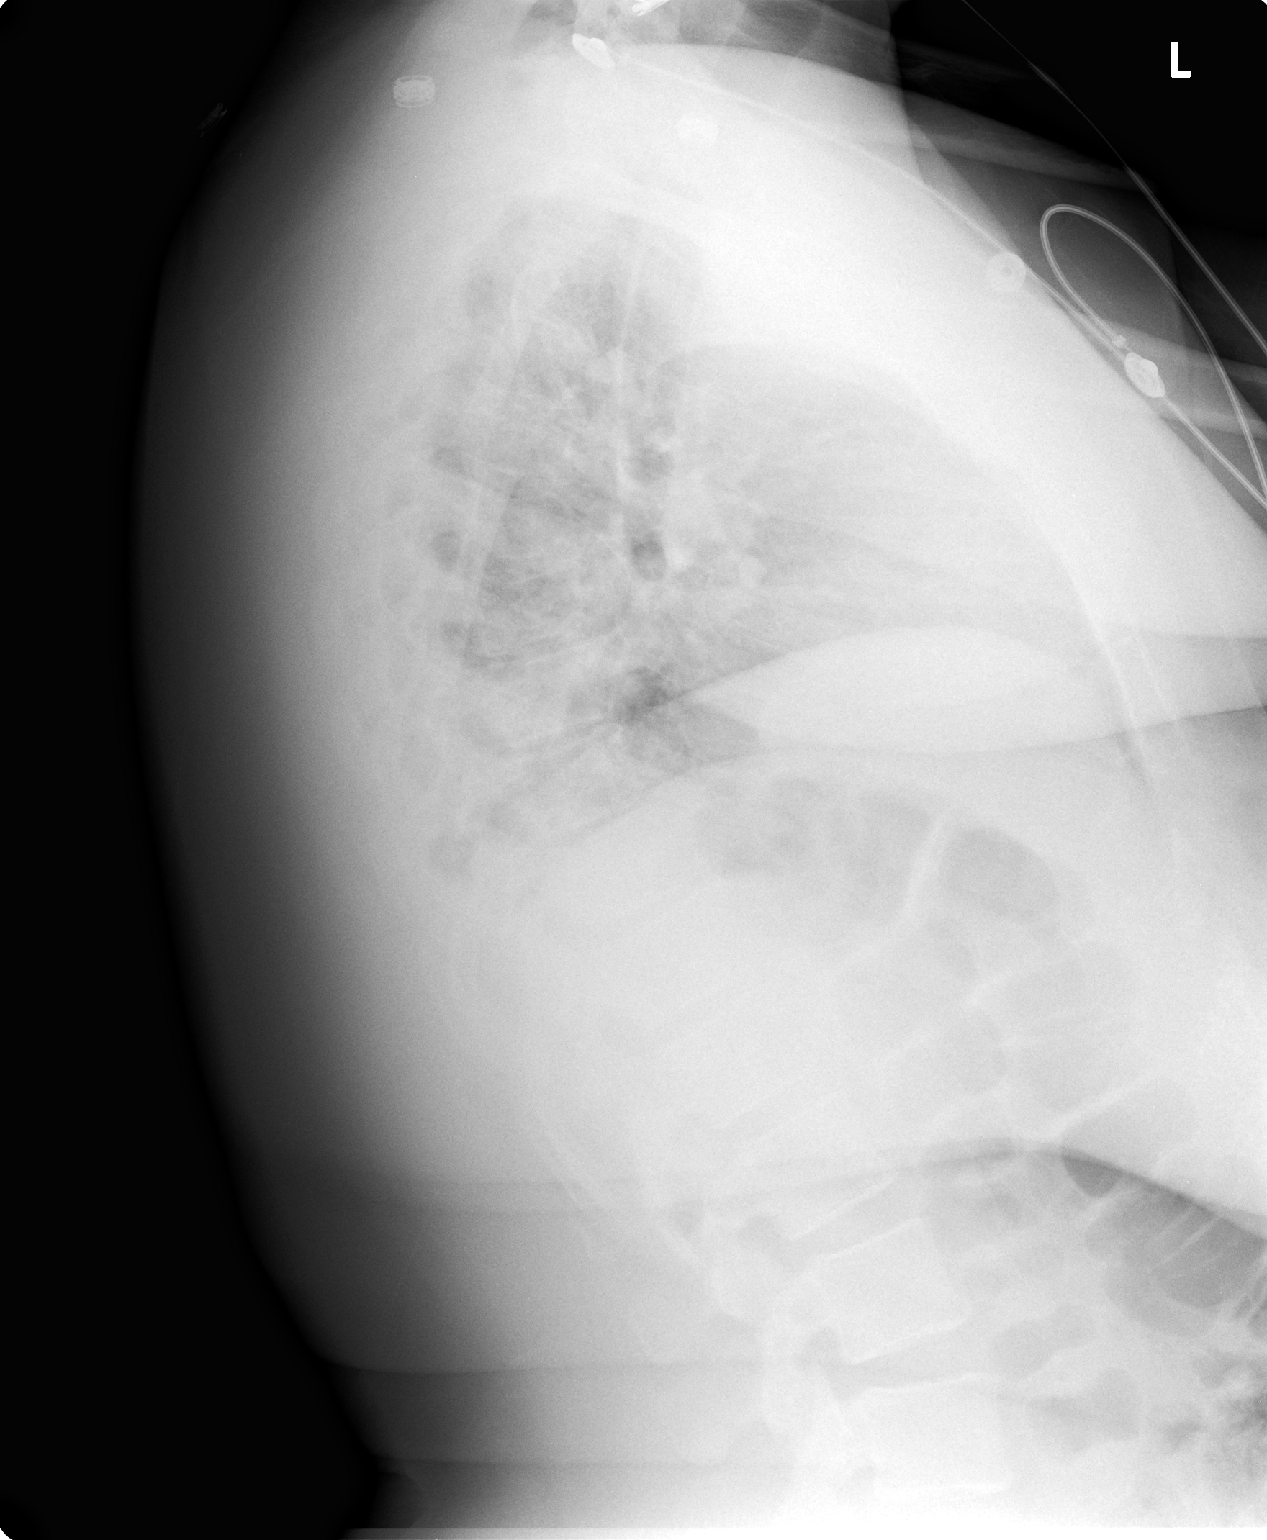

[2 of 2 positions shown; findings below may reference images not displayed]

08/31/18

DIAGNOSTIC STUDIES

EXAM
Chest radiographs.

INDICATION
cough/chest pain
Pt c/o of cough and chest pain x many days. Coughing up blood. Fever and chills. Denies pregnancy.
AW

TECHNIQUE
PA and lateral chest views.

COMPARISONS
April 29, 2018.

FINDINGS
Low lung volumes with vascular crowding. Hazy ground-glass alveolar opacity in the right lung base.
The left lung is clear. No pleural effusion. No pneumothorax.

IMPRESSION
Hazy right basilar alveolar opacity.

Tech Notes:

Pt c/o of cough and chest pain x many days. Coughing up blood. Fever and chills. Denies pregnancy.
AW

## 2019-12-03 ENCOUNTER — Encounter: Admit: 2019-12-03 | Discharge: 2019-12-03 | Payer: PRIVATE HEALTH INSURANCE

## 2019-12-03 DIAGNOSIS — N939 Abnormal uterine and vaginal bleeding, unspecified: Secondary | ICD-10-CM

## 2019-12-03 NOTE — Telephone Encounter
Spoke with patient per Dr. Brandt Loosen recommendation to be seen by Dr. Bynum Bellows in GYN ultrasound. Patient stated that she would prefer to wait to see Dr. Bynum Bellows. Date confirmed with patient. RN informed patient that once time is set, then RN would send myChart message. Patient V/U. No further questions at this time.

## 2019-12-03 NOTE — Telephone Encounter
----- Message from Vear Clock, MD sent at 12/03/2019  2:17 PM CST -----  Regarding: RE: needs sono order  [name] my patient, can you order her repeat ultrasound? Ideally with Dr. Jerelyn Charles if possibe through [facility]   ----- Message -----  From: Lehman Prom, RN  Sent: 12/03/2019   1:44 PM CST  To: Vear Clock, MD  Subject: RE: needs sono order                             Great!     Do you want me to take care of this? Or is Dr. Wallace Cullens seeing this patient now?  ----- Message -----  From: Vear Clock, MD  Sent: 12/03/2019  12:13 PM CST  To: Lehman Prom, RN  Subject: RE: needs sono order                             Sorry I dont understand the confusion.     She needs a follow up ultrasound after 07/2019 to see if the cyst has changed or resolved. Agree with the order by Dr. Wallace Cullens   ----- Message -----  From: Lehman Prom, RN  Sent: 12/03/2019  10:14 AM CST  To: Vear Clock, MD  Subject: FW: needs sono order                             Hi Dr. Tasia Catchings -     Please let me know if there is anything additional I need to do for this patient. [facility] nurse team was confused because of the date of the result and Dr. Gwenette Greet request to schedule another TVUS.    Thanks!  ----- Message -----  From: Melvyn Neth, LPN  Sent: 63/07/7563  10:11 AM CST  To: Lehman Prom, RN  Subject: FW: needs sono order                               ----- Message -----  From: Tawny Hopping, MD  Sent: 11/19/2019   7:12 PM CST  To: Vear Clock, MD, Champ Mungo, RN, #  Subject: needs sono order                                 B and D     Nurses- please order TVS and let patient know to schedule w/ radiology to follow up on her ovarian cyst.   [name] is a patient of Dr. Marcelino Freestone cc'ed here.     Tawny Hopping, MD    ----- Message -----  From: Champ Mungo, RN  Sent: 11/19/2019  12:02 PM CST  To: Tawny Hopping, MD    Dr. Wallace Cullens- Ms. Grabski had recommendations for follow up on an ovarian cyst noted on a US Pelvis in August. If you think follow up is warranted we are happy to assist in coordinating follow up.     Please review the attached imaging report, this patient has an overdue follow up imaging recommendation based on findings by the Radiologist. Please respond with one of the following:    I reviewed the chart and the requested follow up imaging/test and...  A. Per clinical scenario/patient discussion, follow up imaging is not medically necessary.  B.  Follow up imaging is appropriate, exam has been ordered and clinic personnel will communicate with the patient. (Please provide the clinic personnel's name)  C. Follow up imaging is appropriate, but the patient is no longer under our care. Defer to another physician. (Please provide the name of the physician, if known)  D. This is not my patient. Please defer to the appropriate provider.   E. Follow up recommendation has been completed at another facility. (Please provide the date and name of the facility or document in the EMR)  F. Patient is aware of the findings and has declined further follow up.         Please contact us directly if you have questions:    Champ Mungo, RN, [place] [address] Crista Luria, RN, [place] [address] [place] [address]

## 2019-12-31 ENCOUNTER — Other Ambulatory Visit: Payer: Self-pay

## 2019-12-31 ENCOUNTER — Encounter (INDEPENDENT_AMBULATORY_CARE_PROVIDER_SITE_OTHER): Payer: Self-pay | Admitting: Student in an Organized Health Care Education/Training Program

## 2019-12-31 ENCOUNTER — Ambulatory Visit (INDEPENDENT_AMBULATORY_CARE_PROVIDER_SITE_OTHER): Payer: Medicaid Other | Admitting: Student in an Organized Health Care Education/Training Program

## 2019-12-31 ENCOUNTER — Ambulatory Visit: Payer: Medicaid Other | Attending: Student in an Organized Health Care Education/Training Program

## 2019-12-31 VITALS — BP 128/84 | Temp 97.5°F | Ht 68.0 in | Wt 185.5 lb

## 2019-12-31 DIAGNOSIS — F17210 Nicotine dependence, cigarettes, uncomplicated: Secondary | ICD-10-CM

## 2019-12-31 DIAGNOSIS — R232 Flushing: Secondary | ICD-10-CM

## 2019-12-31 DIAGNOSIS — Z6828 Body mass index (BMI) 28.0-28.9, adult: Secondary | ICD-10-CM

## 2019-12-31 DIAGNOSIS — M791 Myalgia, unspecified site: Secondary | ICD-10-CM

## 2019-12-31 LAB — COMPREHENSIVE METABOLIC PROFILE - BMC/JMC ONLY
ALBUMIN/GLOBULIN RATIO: 1.6 (ref 0.8–2.0)
ALBUMIN: 4.1 g/dL (ref 3.5–5.0)
ALKALINE PHOSPHATASE: 75 U/L (ref 38–126)
ALT (SGPT): 18 U/L (ref 14–54)
ANION GAP: 9 mmol/L (ref 3–11)
AST (SGOT): 21 U/L (ref 15–41)
BILIRUBIN TOTAL: 0.2 mg/dL — ABNORMAL LOW (ref 0.3–1.2)
BUN/CREA RATIO: 26 — ABNORMAL HIGH (ref 6–22)
BUN: 19 mg/dL (ref 6–20)
CALCIUM: 9.5 mg/dL (ref 8.6–10.3)
CHLORIDE: 102 mmol/L (ref 101–111)
CO2 TOTAL: 28 mmol/L (ref 22–32)
CREATININE: 0.73 mg/dL (ref 0.44–1.00)
ESTIMATED GFR: >60 mL/min/{1.73_m2} (ref >60)
GLUCOSE: 73 mg/dL (ref 70–110)
POTASSIUM: 4.3 mmol/L (ref 3.4–5.1)
PROTEIN TOTAL: 6.6 g/dL (ref 6.4–8.3)
SODIUM: 139 mmol/L (ref 136–145)

## 2019-12-31 LAB — CBC WITH DIFF
BASOPHIL #: 0.1 10*3/uL (ref 0.00–0.10)
BASOPHIL %: 1 % (ref 0–3)
EOSINOPHIL #: 0.2 10*3/uL (ref 0.00–0.50)
EOSINOPHIL %: 3 % (ref 0–5)
HCT: 39.8 % (ref 36.0–45.0)
HGB: 12.6 g/dL (ref 12.0–15.5)
LYMPHOCYTE #: 2.4 10*3/uL (ref 1.00–4.80)
LYMPHOCYTE %: 33 % (ref 15–43)
MCH: 22 pg — ABNORMAL LOW (ref 27.5–33.2)
MCHC: 31.6 g/dL — ABNORMAL LOW (ref 32.0–36.0)
MCV: 69.6 fL — ABNORMAL LOW (ref 82.0–97.0)
MONOCYTE #: 0.6 10*3/uL (ref 0.20–0.90)
MONOCYTE %: 8 % (ref 5–12)
MPV: 9 fL (ref 7.4–10.5)
NEUTROPHIL #: 4 10*3/uL (ref 1.50–6.50)
NEUTROPHIL %: 55 % (ref 43–76)
PLATELETS: 239 10*3/uL (ref 150–450)
RBC: 5.72 10*6/uL — ABNORMAL HIGH (ref 4.00–5.10)
RDW: 14.7 % (ref 11.0–16.0)
WBC: 7.3 10*3/uL (ref 4.0–11.0)

## 2019-12-31 LAB — THYROID STIMULATING HORMONE WITH FREE T4 REFLEX: TSH: 0.705 u[IU]/mL (ref 0.340–5.330)

## 2019-12-31 LAB — LH: LUTEINIZING HORMONE: 36 IU/L

## 2019-12-31 LAB — FSH: FSH: 53 IU/L

## 2019-12-31 LAB — THYROXINE, FREE (FREE T4): THYROXINE (T4), FREE: 1.07 ng/dL (ref 0.61–1.70)

## 2019-12-31 MED ORDER — CYCLOBENZAPRINE 10 MG TABLET
10.00 mg | ORAL_TABLET | Freq: Three times a day (TID) | ORAL | 0 refills | Status: AC | PRN
Start: 2019-12-31 — End: 2020-01-10

## 2019-12-31 NOTE — Progress Notes (Signed)
[facility], [facility]  [address]  [place] [address]    Acute Office Visit     Name: Ellen Dillon MRN:  [id number]   Date: 12/31/2019 Age: 39 y.o.         Chief Complaint(s):   Chief Complaint   Patient presents with   . Menopause     Had a baby three months ago.        SUBJECTIVE:  Jorja Empie is a 39 y.o. female here for concerns of hot flashes and abnormal menses.    Reports she has had back pain since delivery, had epidural placed at the time  No periods since 6 w pp (had initial bleeding then has stopped)  Hot flashes for the past 2 weeks, happening almost every 30-60 minutes  Wakes up out of sleep  Admits to headaches, no visual changes, 3-4/week  No nipple discharge  Not on hormones currently      Review of Systems   Constitutional: Negative for chills, fever and malaise/fatigue.        Admits to hot flashes   Respiratory: Negative for shortness of breath and wheezing.    Cardiovascular: Negative for chest pain and palpitations.   Genitourinary:        No menses  No vaginal discharge   Neurological: Positive for headaches. Negative for dizziness, tingling and focal weakness.   Psychiatric/Behavioral: Negative for depression and substance abuse. The patient is not nervous/anxious.        Past Medical History:  Past Medical History:   Diagnosis Date   . Anxiety    . Diet controlled White classification A1 gestational diabetes mellitus (GDM) 08/07/2019   . Gestational hypertension 09/01/2019   . Gestational hypertension, third trimester 08/07/2019   . High-risk pregnancy in third trimester 07/17/2019   . Hx of spontaneous abortion, currently pregnant, third trimester    . Hypertension     due to pregnancy   . Multigravida of advanced maternal age in third trimester 07/17/2019   . 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    . 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 Overweight(278.02)    . Panic attack    . Seizure (CMS [facility])     age 32    . Tobacco smoking  complicating pregnancy, third trimester 02/27/2019           Medications:  Current Outpatient Medications   Medication Sig   . buprenorphine-naloxone (SUBOXONE) 8-2 mg Sublingual Tablet, Sublingual    . cyclobenzaprine (FLEXERIL) 10 mg Oral Tablet Take 1 Tab (10 mg total) by mouth Every 8 hours as needed for Muscle spasms for up to 10 days   . Ibuprofen (MOTRIN) 800 mg Oral Tablet Take 1 Tab (800 mg total) by mouth Every 8 hours as needed (Patient not taking: Reported on 12/31/2019)        Allergies:  Allergies   Allergen Reactions   . Penicillins      HIVES   . Propoxyphene      HIVES   . Sulfa (Sulfonamides)      HIVES   . Tramadol Swelling        Social History:  Social History     Tobacco Use   . Smoking status: Current Every Day Smoker     Packs/day: 0.50     Years: 14.00     Pack years: 7.00     Types: Cigarettes   . Smokeless tobacco: Never Used   Substance  Use Topics   . Alcohol use: No   . Drug use: No     Types: Other     Comment: denies/history of opiate pills and cocaine        Family History:  Family Medical History:     Problem Relation (Age of Onset)    Diabetes Maternal Grandmother, Maternal Grandfather    Healthy Father, Mother, Brother              OBJECTIVE:  BP 128/84   Temp 36.4 C (97.5 F) (Thermal Scan)   Ht 1.727 m (5\' 8" )   Wt 84.1 kg (185 lb 8 oz)   BMI 28.21 kg/m         Physical Exam   Constitutional: She is oriented to person, place, and time and well-developed, well-nourished, and in no distress.   HENT:   Head: Normocephalic and atraumatic.   Eyes: Pupils are equal, round, and reactive to light. Conjunctivae are normal.   Neck: No tracheal tenderness present. No thyromegaly present.   Cardiovascular: Normal rate, regular rhythm, normal heart sounds and intact distal pulses.   No LE edema   Pulmonary/Chest: Effort normal and breath sounds normal.   Musculoskeletal:      Lumbar back: She exhibits tenderness. She exhibits normal range of motion, no bony tenderness, no swelling, no  edema, no deformity and no laceration.   Neurological: She is alert and oriented to person, place, and time. She has intact cranial nerves. GCS score is 15.   Skin: Skin is warm and dry.   Psychiatric: Mood and affect normal.   Nursing note and vitals reviewed.        Labs:   08/2019 labs personally reviewed      ASSESSMENT and PLAN:    ICD-10-CM    1. Hot flashes  R23.2 CBC/DIFF     COMPREHENSIVE METABOLIC PROFILE - [facility] ONLY     THYROID STIMULATING HORMONE WITH FREE T4 REFLEX     THYROXINE, FREE (FREE T4)     FSH     LH   2. Muscle pain  M79.10 cyclobenzaprine (FLEXERIL) 10 mg Oral Tablet       Ordered CBC, CMP, TSH, T4, FSH, and LH to assess for cause of hot flashes and abnormal menses  Rx'd flexeril for back pain that seems most likely musculoskeletal in nature  Will notify patient of results    RTC PRN    Christ Kick, DO  12/31/2019, 09:48

## 2019-12-31 NOTE — Nursing Note (Signed)
Chief Complaint:   Chief Complaint            Menopause Had a baby three months ago.         Functional Health Screen        Vital Signs  BP 128/84   Temp 36.4 C (97.5 F) (Thermal Scan)   Ht 1.727 m (5\' 8" )   Wt 84.1 kg (185 lb 8 oz)   BMI 28.21 kg/m       Social History     Tobacco Use   Smoking Status Current Every Day Smoker   . Packs/day: 0.50   . Years: 14.00   . Pack years: 7.00   . Types: Cigarettes   Smokeless Tobacco Never Used     Allergies  Allergies   Allergen Reactions   . Penicillins      HIVES   . Propoxyphene      HIVES   . Sulfa (Sulfonamides)      HIVES   . Tramadol Swelling     Medication History  Reviewed for OTC medication and any new medications, provider will review medication history  Care Team  Patient Care Team:  , DO as PCP - General (FAMILY MEDICINE)  Immunizations - last 24 hours     None        Richardson Dopp, Candis Musa  12/31/2019, 09:35

## 2020-01-05 ENCOUNTER — Other Ambulatory Visit (INDEPENDENT_AMBULATORY_CARE_PROVIDER_SITE_OTHER): Payer: Self-pay | Admitting: Student in an Organized Health Care Education/Training Program

## 2020-01-05 DIAGNOSIS — R232 Flushing: Secondary | ICD-10-CM

## 2020-01-05 NOTE — Progress Notes (Signed)
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  Will place referral to OB/Gyn.    Follow up is not needed.    Thank you,    Dr. Janee Morn

## 2020-06-09 ENCOUNTER — Encounter (INDEPENDENT_AMBULATORY_CARE_PROVIDER_SITE_OTHER): Payer: Self-pay | Admitting: Student in an Organized Health Care Education/Training Program

## 2020-06-12 ENCOUNTER — Encounter: Admit: 2020-06-12 | Discharge: 2020-06-12 | Payer: PRIVATE HEALTH INSURANCE

## 2020-06-12 DIAGNOSIS — J324 Chronic pansinusitis: Secondary | ICD-10-CM

## 2020-06-17 ENCOUNTER — Encounter: Admit: 2020-06-17 | Discharge: 2020-06-17 | Payer: PRIVATE HEALTH INSURANCE

## 2020-06-17 DIAGNOSIS — J324 Chronic pansinusitis: Secondary | ICD-10-CM

## 2020-06-17 MED ORDER — AZELASTINE 137 MCG (0.1 %) NA SPRA
2 | Freq: Two times a day (BID) | NASAL | 4 refills | 50.00000 days | Status: DC
Start: 2020-06-17 — End: 2020-06-17

## 2020-06-17 MED ORDER — AZELASTINE 137 MCG (0.1 %) NA SPRA
2 | Freq: Two times a day (BID) | NASAL | 1 refills | 50.00000 days | Status: AC
Start: 2020-06-17 — End: ?

## 2020-09-21 ENCOUNTER — Encounter (INDEPENDENT_AMBULATORY_CARE_PROVIDER_SITE_OTHER): Payer: Self-pay | Admitting: Student in an Organized Health Care Education/Training Program

## 2020-09-23 IMAGING — CR PELVIS
2 series · 2 of 2 positions shown · non-contrast
Comparison: none

[hip ap]
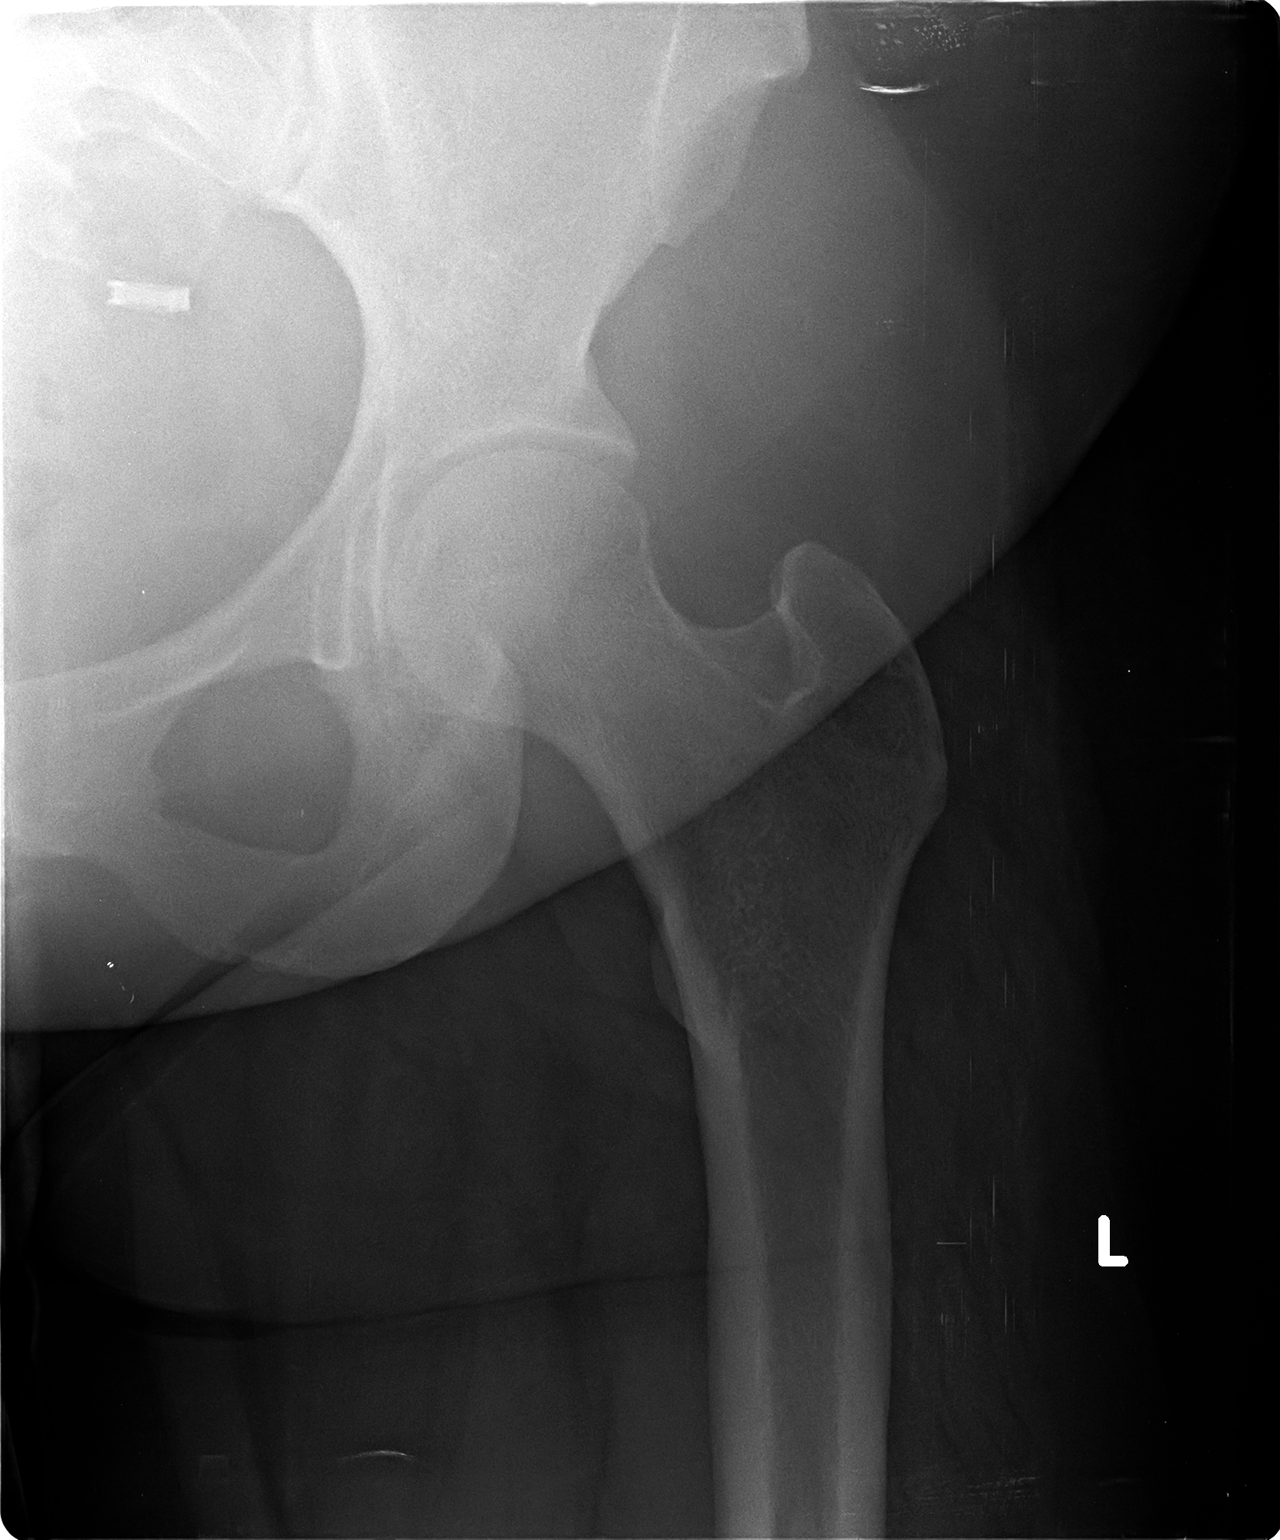

[hip frog]
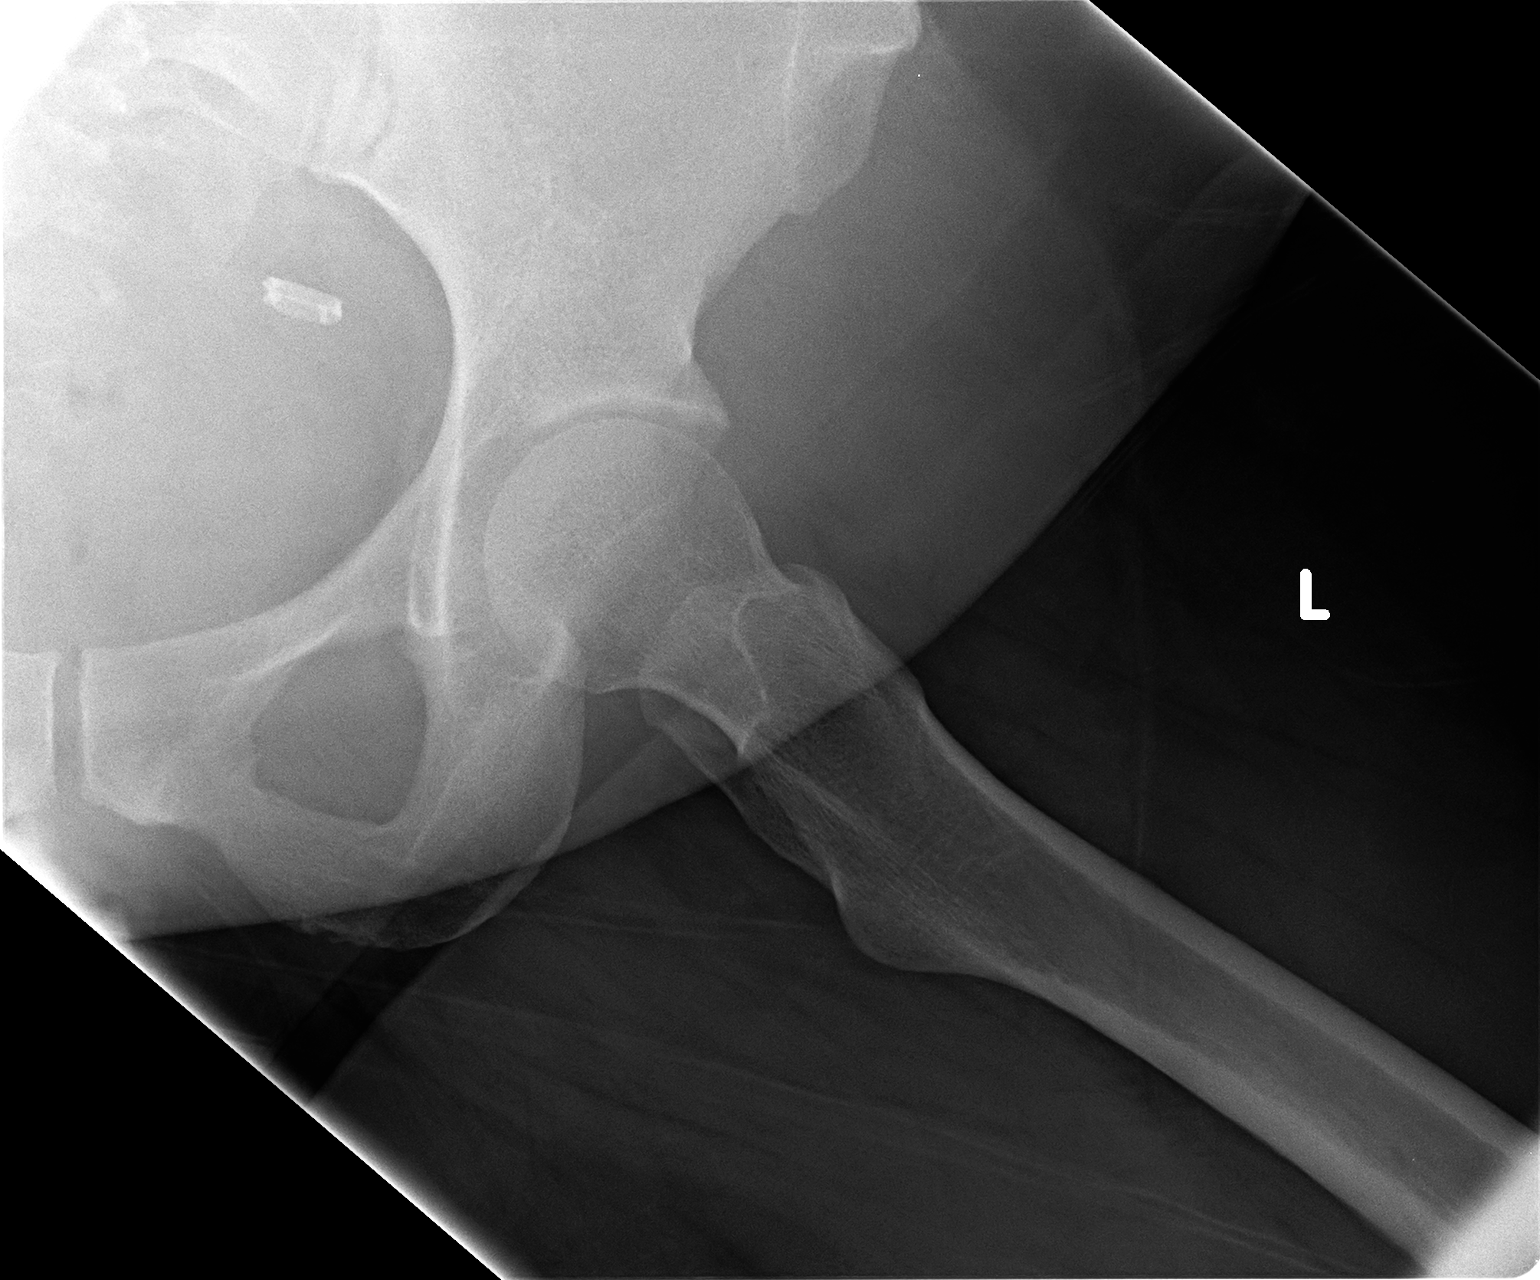

[2 of 2 positions shown; findings below may reference images not displayed]

DIAGNOSTIC STUDIES

EXAM

Two view left hip and single view pelvis

INDICATION

left hip pain
PT C/O LBP INTO LEFT HIP. NKI. PT DENIES PREGNANCY. TJ/TB

TECHNIQUE

Frontal radiographs of the hip and pelvis and frog-leg lateral view left hip

COMPARISONS

No comparisons

FINDINGS

The sacroiliac joints and pubic symphysis are unremarkable. The femoral spheres are preserved.
There is no hip joint space narrowing. No acute fracture or malalignment is identified. No
destructive bony lesion is demonstrated. Lateral center edge angle of the left hip is near 40
degrees which can be indicative of predisposition to pincer type femoroacetabular impingement.
Correlate with clinical exam.

IMPRESSION

No acute bony abnormality.

Tech Notes:

PT C/O LBP INTO LEFT HIP. NKI. PT DENIES PREGNANCY. TJ/TB

## 2020-10-01 ENCOUNTER — Emergency Department (HOSPITAL_COMMUNITY): Payer: Medicaid Other

## 2020-10-01 ENCOUNTER — Emergency Department
Admission: EM | Admit: 2020-10-01 | Discharge: 2020-10-01 | Disposition: A | Discharge status: Home / Self Care | Payer: Medicaid Other | Attending: Emergency Medicine | Admitting: Emergency Medicine

## 2020-10-01 DIAGNOSIS — F1910 Other psychoactive substance abuse, uncomplicated: Secondary | ICD-10-CM | POA: Insufficient documentation

## 2020-10-01 DIAGNOSIS — F17210 Nicotine dependence, cigarettes, uncomplicated: Secondary | ICD-10-CM | POA: Insufficient documentation

## 2020-10-01 LAB — URINALYSIS WITH MICROSCOPIC REFLEX IF INDICATED BMC/JMC ONLY
BILIRUBIN: NEGATIVE mg/dL
GLUCOSE: NEGATIVE mg/dL
LEUKOCYTES: NEGATIVE WBCs/uL
NITRITE: NEGATIVE
PH: 7 (ref <8.0)
PROTEIN: 20 mg/dL — AB
SPECIFIC GRAVITY: 1.032 — ABNORMAL HIGH (ref <1.022)
UROBILINOGEN: 2 mg/dL (ref <=2.0)

## 2020-10-01 LAB — CBC WITH DIFF
BASOPHIL #: 0.1 10*3/uL (ref 0.00–0.10)
BASOPHIL %: 1 % (ref 0–3)
EOSINOPHIL #: 0.2 10*3/uL (ref 0.00–0.50)
EOSINOPHIL %: 3 % (ref 0–5)
HCT: 42.1 % (ref 36.0–45.0)
HGB: 13.3 g/dL (ref 12.0–15.5)
LYMPHOCYTE #: 2.2 10*3/uL (ref 1.00–4.80)
LYMPHOCYTE %: 37 % (ref 15–43)
MCH: 22.5 pg — ABNORMAL LOW (ref 27.5–33.2)
MCHC: 31.5 g/dL — ABNORMAL LOW (ref 32.0–36.0)
MCV: 71.5 fL — ABNORMAL LOW (ref 82.0–97.0)
MONOCYTE #: 0.5 10*3/uL (ref 0.20–0.90)
MONOCYTE %: 8 % (ref 5–12)
MPV: 8.7 fL (ref 7.4–10.5)
NEUTROPHIL #: 3.2 10*3/uL (ref 1.50–6.50)
NEUTROPHIL %: 52 % (ref 43–76)
PLATELETS: 265 10*3/uL (ref 150–450)
RBC: 5.89 10*6/uL — ABNORMAL HIGH (ref 4.00–5.10)
RDW: 14.6 % (ref 11.0–16.0)
WBC: 6.1 10*3/uL (ref 4.0–11.0)

## 2020-10-01 LAB — URINALYSIS, MICROSCOPIC

## 2020-10-01 LAB — COMPREHENSIVE METABOLIC PANEL, NON-FASTING
ALBUMIN: 4 g/dL (ref 3.5–5.0)
ALKALINE PHOSPHATASE: 91 U/L (ref 40–110)
ALT (SGPT): 22 U/L (ref 8–22)
ANION GAP: 10 mmol/L (ref 4–13)
AST (SGOT): 24 U/L (ref 8–45)
BILIRUBIN TOTAL: 0.3 mg/dL (ref 0.3–1.3)
BUN/CREA RATIO: 14 (ref 6–22)
BUN: 13 mg/dL (ref 8–25)
CALCIUM: 9.5 mg/dL (ref 8.5–10.0)
CHLORIDE: 104 mmol/L (ref 96–111)
CO2 TOTAL: 27 mmol/L (ref 22–30)
CREATININE: 0.93 mg/dL (ref 0.60–1.05)
ESTIMATED GFR: >90 mL/min/BSA (ref >=60)
GLUCOSE: 85 mg/dL (ref 65–125)
POTASSIUM: 4.1 mmol/L (ref 3.5–5.1)
PROTEIN TOTAL: 7.7 g/dL (ref 6.4–8.3)
SODIUM: 141 mmol/L (ref 136–145)

## 2020-10-01 LAB — DRUG SCREEN, NO CONFIRMATION, URINE
AMPHETAMINES, URINE: POSITIVE — AB
BARBITURATES URINE: NEGATIVE
BENZODIAZEPINES URINE: NEGATIVE
BUPRENORPHINE URINE: POSITIVE — AB
CANNABINOIDS URINE: POSITIVE — AB
COCAINE METABOLITES URINE: POSITIVE — AB
CREATININE RANDOM URINE: 266 mg/dL — ABNORMAL HIGH (ref 50–100)
ECSTASY/MDMA URINE: NEGATIVE
FENTANYL, RANDOM URINE: NEGATIVE
METHADONE URINE: NEGATIVE
OPIATES URINE (LOW CUTOFF): POSITIVE — AB
OXYCODONE URINE: POSITIVE — AB

## 2020-10-01 LAB — THYROXINE, FREE (FREE T4): THYROXINE (T4), FREE: 0.79 ng/dL (ref 0.70–1.25)

## 2020-10-01 LAB — ETHANOL, SERUM: ETHANOL: NOT DETECTED

## 2020-10-01 LAB — GREEN TUBE

## 2020-10-01 LAB — RED TOP TUBE

## 2020-10-01 LAB — THYROID STIMULATING HORMONE WITH FREE T4 REFLEX: TSH: 0.166 u[IU]/mL — ABNORMAL LOW (ref 0.430–3.550)

## 2020-10-01 LAB — GOLD TOP TUBE

## 2020-10-01 LAB — BLUE TOP TUBE

## 2020-10-01 LAB — HIV1/HIV2 SCREEN, COMBINED ANTIGEN AND ANTIBODY: HIV SCREEN, COMBINED ANTIGEN & ANTIBODY: NEGATIVE

## 2020-10-01 LAB — HCG, URINE QUALITATIVE, PREGNANCY: HCG URINE QUALITATIVE: NEGATIVE

## 2020-10-01 LAB — LIGHT GREEN TOP TUBE

## 2020-10-01 MED ORDER — BUPRENORPHINE 8 MG-NALOXONE 2 MG SUBLINGUAL TABLET
2.0000 | SUBLINGUAL_TABLET | Freq: Every day | SUBLINGUAL | 0 refills | Status: AC
Start: 2020-10-01 — End: 2020-10-03

## 2020-10-01 MED ORDER — NICOTINE 21 MG/24 HR DAILY TRANSDERMAL PATCH
21.0000 mg | MEDICATED_PATCH | TRANSDERMAL | Status: DC
Start: 2020-10-01 — End: 2020-10-01
  Administered 2020-10-01: 21 mg via TRANSDERMAL
  Filled 2020-10-01: qty 1

## 2020-10-01 MED ORDER — NALOXONE 4 MG/ACTUATION NASAL SPRAY
1.0000 | NASAL | 0 refills | Status: DC | PRN
Start: 2020-10-01 — End: 2021-01-11

## 2020-10-01 MED ORDER — NICOTINE 21 MG/24 HR DAILY TRANSDERMAL PATCH
21.0000 mg | MEDICATED_PATCH | Freq: Every day | TRANSDERMAL | Status: DC
Start: 2020-10-01 — End: 2020-10-01

## 2020-10-01 NOTE — Discharge Instructions (Addendum)
It is very important that you follow up with [facility] on Tuesday. Narcan for risk reduction. Take the Suboxone tablet as directed. You will not be able to fill this medication until 10/04/20. Return if symptoms worsen.

## 2020-10-01 NOTE — Consults (Signed)
SUD received a call from pt seeking Bridge appt. SUD stated pt is welcomed to come in today, but will have a long wait due to the number of pts in the ED. Pt has a follow up appt with [facility] on 10/05/20 at 12:00pm. Pt is out of strips and is feeling sick. Pt was previously at [facility] for six years but left due personnel changes. Pt moved to [facility] and was there for two weeks. Pt states she was discharged because she was unable to meet all of the group therapy required to remain in the program. PT stated she has a one year old child and does not have her own transportation. SUD scheduled an ED Bridge for today at 2:30pm. SUD reiterated that pt will have an extended wait time due to the number of pt's seeking medical treatment.

## 2020-10-01 NOTE — Consults (Signed)
Secondary Screen: Yes    ASSIST score = 42 Opioids and Suboxone    PHQ-9 score = 10    GAD7 score = 21 Pt is exteremly anxious and worried about obtaining drugs and her mother.     Substance(s): Amphetamine, Suboxone, Marijuana, Cocaine,Opioids, Oxycodone    Brief Intervention: Yes    TMI Benefit: N/A    Referral/location: Yes, Secretary/administrator and [place] and [employer]IRT pamphlets given    Detox: Pt declined due to having a small child in the home    Bridge/OP: Yes, [facility]    Warm Handoff: No    Outcome: Completed    Follow-up in 1 month: Pt agreed    Follow-up in 3 months: Pt agreed    Notes:  SUD met with pt and Dr. Donnamarie Poag after pt's drug screen came back positive for multiple substances. Dr. Donnamarie Poag also researched pt's last Suboxone script received by [facility]. According to the calculations, pt current script should last pt until 10/03/20 evening. Pt gave no reason for being out of the script so soon. Pt presented very emotional and sobbing. Pt stated she last used Suboxone yesterday and obtained two Percocet's from a friend to help with her cravings. Dr. Donnamarie Poag provided pt with a script for two days to cover 10/04/20 and 10/05/20, but warned pt the pharmacy would probably not be able to fill the script due to regulations of a controlled substance. SUD spoke to pt alone to dig deeper into her current situation. Pt stated she lives with her mother and depends on her mother for transportation. Pt stated she is having to answer to her mother on every aspect of her life. Pt stated she is in need of counseling but will only accept inpatient counseling. SUD provided pt with information on obtaining peer recovery coach that might be a valuable tool to obtaining sobriety and getting back on her feet.

## 2020-10-01 NOTE — Consults (Signed)
PT seeking Bridge appt.    ED Bridge appt.: 10/01/20 at 2:30pm  Follow up appt." 10/05/2020 at 12:00pm with [facility].

## 2020-10-01 NOTE — Consults (Signed)
SUD spoke with Ellen Dillon with [facility] to confirm does has a follow up appt with their agency. Pt's follow up appt. Is 10/05/20 at 12:00pm.

## 2020-10-01 NOTE — ED Nurses Note (Signed)
Pt verbalized understanding of HGI

## 2020-10-01 NOTE — ED Provider Notes (Signed)
Hadassah Pais,  PA-C  [facility]  [address]  [place], [place] [address]    Emergency Department Visit Note      Date: 10/01/2020  Primary care provider: Richardson Dopp, DO  Means of arrival: private car  History obtained by: patient  History limited by: none    Chief Complaint:  Needs Medical Clearance for Bridge Program    History of Present Illness     Ellen Dillon, date of birth 08/14/1981, is a 39 y.o. female who presents to the Emergency Department requesting medical clearance for bridge program. 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 She was at [facility] for outpatient Suboxone treatment and had trouble with the new therapist and ended up leaving. She was seen at [facility] on 09/08/20 for intake and then follow up group on 09/17/20. She states that she is out of her Suboxone and that she ended up taking two 7.5mg  Percocet yesterday. She also has been using other drugs to help decrease her "anxiety." She is living with her mom and taking care of her baby and is very "stressed out." She does not have a psychiatrist. She presents for bridge of her Suboxone to [facility] on 10/05/20. She generally takes two 8mg  tablets per day. She reports increased irritability and anxiety.      Denies fevers, chills, nasal congestion, chest pain, SOB, N/V/D, abdominal pain, dysuria, headache, or dizziness. No SI/HI. No hallucinations.     Onset:   as above  Timing:   as above  Location/Radiation:   as above  Quality:  as above   Severity:  as above  Modifying Factors:  as above    Review of Systems     The pertinent positive and negative symptoms are as per HPI. All other systems reviewed and are negative.    Patient History      Past Medical History:  Past Medical History:   Diagnosis Date   . Anxiety    . Diet controlled White classification A1 gestational diabetes mellitus (GDM) 08/07/2019   . Gestational hypertension 09/01/2019   . Gestational hypertension, third trimester 08/07/2019   . High-risk pregnancy in third trimester  07/17/2019   . Hx of spontaneous abortion, currently pregnant, third trimester    . Hypertension     due to pregnancy   . Multigravida of advanced maternal age in third trimester 07/17/2019   . 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    . 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 Overweight(278.02)    . Panic attack    . Seizure (CMS [facility])     age 89    . Tobacco smoking complicating pregnancy, third trimester 02/27/2019       Past Surgical History:  Past Surgical History:   Procedure Laterality Date   . HX BREAST BIOPSY      x3 cyst    . HX CYST INCISION AND DRAINAGE  LEFT   . HX DILATION AND CURETTAGE  01/04/12     Dilation and Curettage suction        Family History:  Family Medical History:     Problem Relation (Age of Onset)    Diabetes Maternal Grandmother, Maternal Grandfather    Healthy Father, Mother, Brother              Social History:  Social History     Tobacco Use   . Smoking status: Current Every Day Smoker     Packs/day: 0.50     Years: 14.00  Pack years: 7.00     Types: Cigarettes   . Smokeless tobacco: Never Used   Substance Use Topics   . Alcohol use: No   . Drug use: No     Types: Other     Comment: denies/history of opiate pills and cocaine     Social History     Substance and Sexual Activity   Drug Use No   . Types: Other    Comment: denies/history of opiate pills and cocaine       Medications:  Patient's Medications   New Prescriptions    No medications on file   Previous Medications    BUPRENORPHINE-NALOXONE (SUBOXONE) 8-2 MG SUBLINGUAL TABLET, SUBLINGUAL        IBUPROFEN (MOTRIN) 800 MG ORAL TABLET    Take 1 Tab (800 mg total) by mouth Every 8 hours as needed   Modified Medications    No medications on file   Discontinued Medications    No medications on file       Allergies:   Allergies   Allergen Reactions   . Penicillins      HIVES   . Propoxyphene      HIVES   . Sulfa (Sulfonamides)      HIVES   . Tramadol Swelling       Physical Exam     Vital Signs:    Filed Vitals:     10/01/20 1556   BP: 139/75   Pulse: 60   Resp: 18   Temp: 36.1 C (96.9 F)   SpO2: 100%       Pulse Ox: 100% on None (Room Air); interpreted by me UR:KYHCWC    Constitutional: This is an awake female patient who is anxious and slightly irritable. Ambulates without difficulty. No obvious sedation noted.   Neuro/Psychiatric: Patient is interactive and appropriate. Alert and oriented x 3.    Neurological: Normal facial symmetry and speech.  CN II-XII grossly intact. No focal deficits.   Skin: Warm, dry and good color. No obvious rash noted.  HEENT:   Head: Normocephalic and atraumatic.   Eyes: EOMI, PERRL, conjunctiva normal in appearance, no gross deformity noted  Neck: Trachea midline. Neck supple.   Cardiovascular: RRR, no obvious murmurs, rubs or gallops appreciated. Intact distal pulses.    Pulmonary/Chest: No respiratory distress. BS clear and equal to auscultation bilaterally.   Abdominal: BS +. Abdomen soft, no tenderness, rebound or guarding. No obvious masses or organomegaly.    Musculoskeletal: No obvious edema or deformity.  Moves spine and extremities freely.     Diagnostics     Labs:    Results for orders placed or performed during the hospital encounter of 10/01/20   COMPREHENSIVE METABOLIC PANEL, NON-FASTING   Result Value Ref Range    SODIUM 141 136 - 145 mmol/L    POTASSIUM 4.1 3.5 - 5.1 mmol/L    CHLORIDE 104 96 - 111 mmol/L    CO2 TOTAL 27 22 - 30 mmol/L    ANION GAP 10 4 - 13 mmol/L    BUN 13 8 - 25 mg/dL    CREATININE 3.76 2.83 - 1.05 mg/dL    BUN/CREA RATIO 14 6 - 22    ESTIMATED GFR >90 >=60 mL/min/BSA    ALBUMIN 4.0 3.5 - 5.0 g/dL     CALCIUM 9.5 8.5 - 15.1 mg/dL    GLUCOSE 85 65 - 761 mg/dL    ALKALINE PHOSPHATASE 91 40 - 110 U/L    ALT (SGPT) 22  8 - 22 U/L    AST (SGOT)  24 8 - 45 U/L    BILIRUBIN TOTAL 0.3 0.3 - 1.3 mg/dL    PROTEIN TOTAL 7.7 6.4 - 8.3 g/dL   DRUG SCREEN, NO CONFIRMATION, URINE   Result Value Ref Range    AMPHETAMINES, URINE Positive (A) Negative    BARBITURATES URINE  Negative Negative    BENZODIAZEPINES URINE Negative Negative    BUPRENORPHINE URINE Positive (A) Negative    CANNABINOIDS URINE Positive (A) Negative    COCAINE METABOLITES URINE Positive (A) Negative    METHADONE URINE Negative Negative    OPIATES URINE (LOW CUTOFF) Positive (A) Negative    OXYCODONE URINE Positive (A) Negative    ECSTASY/MDMA URINE Negative Negative    FENTANYL, RANDOM URINE Negative Negative    CREATININE RANDOM URINE 266 (H) 50 - 100 mg/dL   ETHANOL, SERUM   Result Value Ref Range    ETHANOL None Detected    THYROID STIMULATING HORMONE WITH FREE T4 REFLEX   Result Value Ref Range    TSH 0.166 (L) 0.430 - 3.550 uIU/mL   URINALYSIS WITH MICROSCOPIC REFLEX IF INDICATED [facility] ONLY   Result Value Ref Range    COLOR Yellow Light Yellow, Straw, Yellow    APPEARANCE Cloudy (A) Clear    PH 7.0 <8.0    LEUKOCYTES Negative Negative WBCs/uL    NITRITE Negative Negative    PROTEIN 20  (A) Negative, 10  mg/dL    GLUCOSE Negative Negative mg/dL    KETONES Trace (A) Negative mg/dL    UROBILINOGEN 2.0  <=1.6<=2.0 mg/dL    BILIRUBIN Negative Negative mg/dL    BLOOD Trace (A) Negative mg/dL    SPECIFIC GRAVITY 1.0961.032 (H) <1.022   HCG, URINE QUALITATIVE, PREGNANCY   Result Value Ref Range    HCG URINE QUALITATIVE Negative Negative   CBC WITH DIFF   Result Value Ref Range    WBC 6.1 4.0 - 11.0 x10^3/uL    RBC 5.89 (H) 4.00 - 5.10 x10^6/uL    HGB 13.3 12.0 - 15.5 g/dL    HCT 04.542.1 40.936.0 - 81.145.0 %    MCV 71.5 (L) 82.0 - 97.0 fL    MCH 22.5 (L) 27.5 - 33.2 pg    MCHC 31.5 (L) 32.0 - 36.0 g/dL    RDW 91.414.6 78.211.0 - 95.616.0 %    PLATELETS 265 150 - 450 x10^3/uL    MPV 8.7 7.4 - 10.5 fL    NEUTROPHIL % 52 43 - 76 %    LYMPHOCYTE % 37 15 - 43 %    MONOCYTE % 8 5 - 12 %    EOSINOPHIL % 3 0 - 5 %    BASOPHIL % 1 0 - 3 %    NEUTROPHIL # 3.20 1.50 - 6.50 x10^3/uL    LYMPHOCYTE # 2.20 1.00 - 4.80 x10^3/uL    MONOCYTE # 0.50 0.20 - 0.90 x10^3/uL    EOSINOPHIL # 0.20 0.00 - 0.50 x10^3/uL    BASOPHIL # 0.10 0.00 - 0.10 x10^3/uL   URINALYSIS,  MICROSCOPIC   Result Value Ref Range    RBCS 0-2 0 - 2 /hpf    WBCS None 0 - 2 /hpf    BACTERIA None None /hpf    SQUAMOUS EPITHELIAL 2-5 (A) 0 - 2 /hpf    MUCOUS Slight (A) None, Light /hpf   THYROXINE, FREE (FREE T4)   Result Value Ref Range    THYROXINE (T4), FREE 0.79 0.70 -  1.25 ng/dL     Labs reviewed and interpreted by me.    Radiology:    Ordered this visit:  None        ED Progress Note/Medical Decision Making     Old records reviewed by me:   ED Visits  Previous ED Visits     Complaint Diagnosis Description Type Department Provider   02/12/19 Vaginal Bleeding - Pregnant Threatened miscarriage ... ED (Discharged) [facility] Sudie Grumbling [place], DO   08/07/17 Foot Pain  ED (Discharged) [facility] Fatima Blank, MD   01/22/17 Sore Throat; Headache  ED (Discharged) [facility] Fatima Blank, MD   05/30/15 Back Pain  ED (Discharged) ZER    03/10/14 High Blood Pressure  ED (Discharged) [facility]ER Idelle Crouch, MD   10/25/13 Wrist Pain  ED (Discharged) ZER    12/24/11 Vaginal Discharge Vaginal discharge ED (Discharged) [facility].ER    12/10/11 Abdominal Pain Pelvic pain in pregnancy ED (Discharged) [facility] Cushing, Malena Edman, MD       I have reviewed the patient's recent past medical history. Nurse's notes reviewed.  Orders placed during this encounter:  Orders Placed This Encounter   . CBC/DIFF   . COMPREHENSIVE METABOLIC PANEL, NON-FASTING   . DRUG SCREEN, NO CONFIRMATION, URINE   . ETHANOL, SERUM   . THYROID STIMULATING HORMONE WITH FREE T4 REFLEX   . URINALYSIS WITH MICROSCOPIC REFLEX IF INDICATED [facility] ONLY   . HCG, URINE QUALITATIVE, PREGNANCY   . HIV1/HIV2 SCREEN, COMBINED ANTIGEN AND ANTIBODY   . RAINBOW DRAW - [facility] ONLY   . CBC WITH DIFF   . BLUE TOP TUBE   . GOLD TOP TUBE   . RED TOP TUBE   . Green Tube   . LIGHT GREEN TOP TUBE   . URINALYSIS, MICROSCOPIC   . THYROXINE, FREE (FREE T4)   . nicotine (NICODERM CQ) transdermal patch (mg/24 hr)       1737 Patient was initially evaluated by me, possible etiologies  for symptoms were discussed with patient and the need for further evaluation with the above labs as well as evaluation by [facility]. Labs were performed from triage and the patient's drug screen is positive for amphetamines, cocaine, buprenorphine, THC, opiates and Oxycodone. Patient admits to the Percocet and "other stuff." I have reviewed the PDMP and she had a 2 week prescription written on 09/17/20. She did not get the medication filled until 09/20/20 and she should have medications until 10/04/20. Patient states that she is out of medication and she was told to come to the ED for the bridge until she can get to [facility]. Patient denies taking more than prescribed medications. She is concerned because she admits that she most likely will use until she gets her meds Monday. I told her that there was not anything that I could do as the pharmacy will not fill the medications until the date of refill. [facility] called.    COWS 3    1800 Jessica, [facility] provider, came to see the patient.     1820 Patient is tearful and states that she is having cravings. She is concerned about using. She was provided resources for peer recovery.     1840 She is unable to take the films due to side effects. Patient given a two day script for the Suboxone. Narcan for risk reduction. She understands that she is to follow up with [facility] on Tuesday at 1200. I have concerns for the patient and she is at risk for  OD. She still has multiple substances that she is using and last used yesterday. Her COWs is only 3 and she is not experiencing major mild or even moderate withdrawal at this time. She may certainly return at anytime for any worsening symptoms. Otherwise, she is to follow up with [facility].  Patient verbalized understanding and was in agreement with the plan at this time      MIPS      Not applicable      OPIATE PRESCRIPTION       NARC Score      In addition to the non opioid alternatives that I have recommended, treatment with an  opioid medication is indicated on a short term basis as a medically necessary treatment for the patient's acute, severe pain. The CMSP has been reviewed via Narxcheck and the patient's score is 390.  I have discussed the benefits and the risks of opioid medications including the potential for addiction and overdose/ fetal respiratory depression, particularly if combined with alcohol or other central nervous system depressants.  I have discussed the quantity of medications being prescribed and the option to fill the prescription in the lesser quantity.  The patient is in agreement to proceed with treatment with an opioid medication.  Discharge instructions that explain the risks and side effects of opioid medications have also been provided.        CORE MEASURES      Not applicable     CRITICAL CARE TIME      Not applicable      The patient was advised that if symptoms are to worsen that she is to return to the ED immediately, she verbalized understanding. Return precautions were discussed, the patient verbalized understanding and is currently stable for discharge. All questions and concerns were answered to her satisfaction and the patient is in agreement with the plan.     Supervising physician: Dr. Renaye Rakers Vitals:    10/01/20 1556   BP: 139/75   Pulse: 60   Resp: 18   Temp: 36.1 C (96.9 F)   SpO2: 100%         Clinical Impression      Encounter Diagnosis   Name Primary?   . Polysubstance abuse (CMS HCC) Yes       Plan/Disposition     Discharged    Prescriptions:  Discharge Medication List as of 10/01/2020  6:36 PM      START taking these medications    Details   naloxone (NARCAN) 4 mg per spray nasal spray 1 Spray by INTRANASAL route Every 2 minutes as needed for actual or suspected opioid overdose. Call 911 if used., Disp-2 Each, R-0, Print         Suboxone tabs  two tabs x 2 days. Number 4    Follow Up:    [facility]  [address]   [place], [place]   [phone]  On  10/05/2020  1200pm         Condition on Disposition: Stable    Portions of this note may be dictated using voice recognition software or a dictation service. Variances in spelling and vocabulary are possible and unintentional. Not all errors are caught/corrected. Please notify the Thereasa Parkin if any discrepancies are noted or if the meaning of any statement is not clear.

## 2020-10-01 NOTE — ED Triage Notes (Signed)
Pt presents to the ED for the Bridge program. States she last smoke Cocaine 6 days ago. Denies withdrawal symptoms at this time. NAD in triage.

## 2020-10-19 ENCOUNTER — Telehealth (HOSPITAL_COMMUNITY): Payer: Self-pay | Admitting: INFECTIOUS DISEASE

## 2020-10-19 DIAGNOSIS — U071 COVID-19: Secondary | ICD-10-CM

## 2020-11-12 LAB — ECG 12-LEAD
Atrial Rate: 56 {beats}/min
Calculated P Axis: 49 degrees
Calculated R Axis: 53 degrees
Calculated T Axis: 33 degrees
PR Interval: 176 ms
QRS Duration: 92 ms
QT Interval: 428 ms
QTC Calculation: 413 ms
Ventricular rate: 56 {beats}/min

## 2021-01-10 NOTE — Consults (Addendum)
Pt called to request a Bridge appt. Stated she was "discharged from [facility]" after being "accused of giving another pt drugs". Pt denies this even happening. Stated she "will be out of Suboxone this Wednesday". Pt states she has "only used Suboxone". Informed pt of Bridge rules and expectations. Pt verbalized understanding.    ED Bridge: 01/12/20 - 9am    Follow-up: 01/13/21 - 9am [facility]     Reminder slips created and e-mail sent

## 2021-01-11 ENCOUNTER — Emergency Department
Admission: EM | Admit: 2021-01-11 | Discharge: 2021-01-11 | Disposition: A | Discharge status: Home / Self Care | Payer: Medicaid Other | Attending: Emergency Medicine | Admitting: Emergency Medicine

## 2021-01-11 ENCOUNTER — Other Ambulatory Visit: Payer: Self-pay

## 2021-01-11 DIAGNOSIS — Z79891 Long term (current) use of opiate analgesic: Secondary | ICD-10-CM | POA: Insufficient documentation

## 2021-01-11 DIAGNOSIS — Z139 Encounter for screening, unspecified: Secondary | ICD-10-CM | POA: Insufficient documentation

## 2021-01-11 DIAGNOSIS — F17210 Nicotine dependence, cigarettes, uncomplicated: Secondary | ICD-10-CM | POA: Insufficient documentation

## 2021-01-11 LAB — URINALYSIS WITH MICROSCOPIC REFLEX IF INDICATED BMC/JMC ONLY
BILIRUBIN: NEGATIVE mg/dL
GLUCOSE: NEGATIVE mg/dL
KETONES: NEGATIVE mg/dL
LEUKOCYTES: NEGATIVE WBCs/uL
NITRITE: NEGATIVE
PH: 6.5 (ref <8.0)
PROTEIN: NEGATIVE mg/dL
SPECIFIC GRAVITY: 1.015 (ref <1.022)
UROBILINOGEN: 0.2 mg/dL (ref <=2.0)

## 2021-01-11 LAB — URINALYSIS, MICROSCOPIC

## 2021-01-11 LAB — HIV1/HIV2 SCREEN, COMBINED ANTIGEN AND ANTIBODY: HIV SCREEN, COMBINED ANTIGEN & ANTIBODY: NEGATIVE

## 2021-01-11 LAB — DRUG SCREEN, NO CONFIRMATION, URINE
AMPHETAMINES, URINE: NEGATIVE
BARBITURATES URINE: NEGATIVE
BENZODIAZEPINES URINE: NEGATIVE
BUPRENORPHINE URINE: POSITIVE — AB
CANNABINOIDS URINE: NEGATIVE
COCAINE METABOLITES URINE: POSITIVE — AB
CREATININE RANDOM URINE: 55 mg/dL (ref 50–100)
ECSTASY/MDMA URINE: NEGATIVE
FENTANYL, RANDOM URINE: NEGATIVE
METHADONE URINE: NEGATIVE
OPIATES URINE (LOW CUTOFF): NEGATIVE
OXYCODONE URINE: NEGATIVE

## 2021-01-11 LAB — MANUAL DIFF AND MORPHOLOGY-SYSMEX
BASOPHIL #: <0.1 10*3/uL (ref <=0.20)
BASOPHIL %: 0 %
EOSINOPHIL #: 0.1 10*3/uL (ref <=0.50)
EOSINOPHIL %: 2 %
LYMPHOCYTE #: 2.2 10*3/uL (ref 1.00–4.80)
LYMPHOCYTE %: 39 %
MONOCYTE #: 0.35 10*3/uL (ref 0.20–1.10)
MONOCYTE %: 7 %
NEUTROPHIL #: 2.35 10*3/uL (ref 1.50–7.70)
NEUTROPHIL %: 47 %
REACTIVE LYMPHOCYTE %: 5 %

## 2021-01-11 LAB — CBC WITH DIFF
HCT: 40.4 % (ref 34.8–46.0)
HGB: 12.3 g/dL (ref 11.5–16.0)
MCH: 22 pg — ABNORMAL LOW (ref 26.0–32.0)
MCHC: 30.4 g/dL — ABNORMAL LOW (ref 31.0–35.5)
MCV: 72.1 fL — ABNORMAL LOW (ref 78.0–100.0)
MPV: 10.5 fL (ref 8.7–12.5)
PLATELETS: 259 10*3/uL (ref 150–400)
RBC: 5.6 10*6/uL — ABNORMAL HIGH (ref 3.85–5.22)
RDW-CV: 14.6 % (ref 11.5–15.5)
WBC: 5 10*3/uL (ref 3.7–11.0)

## 2021-01-11 LAB — COMPREHENSIVE METABOLIC PANEL, NON-FASTING
ALBUMIN: 4 g/dL (ref 3.5–5.0)
ALKALINE PHOSPHATASE: 68 U/L (ref 40–110)
ALT (SGPT): 15 U/L (ref 8–22)
ANION GAP: 5 mmol/L (ref 4–13)
AST (SGOT): 15 U/L (ref 8–45)
BILIRUBIN TOTAL: 0.3 mg/dL (ref 0.3–1.3)
BUN/CREA RATIO: 16 (ref 6–22)
BUN: 11 mg/dL (ref 8–25)
CALCIUM: 9 mg/dL (ref 8.5–10.0)
CHLORIDE: 108 mmol/L (ref 96–111)
CO2 TOTAL: 25 mmol/L (ref 22–30)
CREATININE: 0.7 mg/dL (ref 0.60–1.05)
ESTIMATED GFR: >90 mL/min/BSA (ref >=60)
GLUCOSE: 100 mg/dL (ref 65–125)
POTASSIUM: 4 mmol/L (ref 3.5–5.1)
PROTEIN TOTAL: 7.2 g/dL (ref 6.0–7.9)
SODIUM: 138 mmol/L (ref 136–145)

## 2021-01-11 LAB — ETHANOL, SERUM: ETHANOL: NOT DETECTED

## 2021-01-11 LAB — HCG, URINE QUALITATIVE, PREGNANCY: HCG URINE QUALITATIVE: NEGATIVE

## 2021-01-11 LAB — HEPATITIS C ANTIBODY SCREEN WITH REFLEX TO HCV PCR: HCV ANTIBODY QUALITATIVE: NEGATIVE

## 2021-01-11 LAB — HEPATITIS B CORE IGM, AB: HBV CORE IGM ANTIBODY QUALITATIVE: NEGATIVE

## 2021-01-11 LAB — HEPATITIS A (HAV) IGM ANTIBODY: HAV IGM: NEGATIVE

## 2021-01-11 LAB — HEPATITIS B SURFACE ANTIGEN: HBV SURFACE ANTIGEN QUALITATIVE: NEGATIVE

## 2021-01-11 LAB — THYROID STIMULATING HORMONE WITH FREE T4 REFLEX: TSH: 0.731 u[IU]/mL (ref 0.430–3.550)

## 2021-01-11 MED ORDER — BUPRENORPHINE 8 MG-NALOXONE 2 MG SUBLINGUAL TABLET
2.0000 | SUBLINGUAL_TABLET | Freq: Every day | SUBLINGUAL | 0 refills | Status: DC
Start: 2021-01-11 — End: 2021-01-14

## 2021-01-11 MED ORDER — NALOXONE 4 MG/ACTUATION NASAL SPRAY
1.0000 | NASAL | 0 refills | Status: DC | PRN
Start: 2021-01-11 — End: 2021-07-04

## 2021-01-11 MED ORDER — IBUPROFEN 800 MG TABLET
400.0000 mg | ORAL_TABLET | ORAL | Status: AC
Start: 2021-01-11 — End: 2021-01-11
  Administered 2021-01-11: 400 mg via ORAL
  Filled 2021-01-11: qty 1

## 2021-01-11 NOTE — Behavioral Health (Signed)
Secondary Screen: Yes    ASSIST score = 40    PHQ-9 score = 10    GAD7 score = 8    Substance(s): Suboxone, Cocaine    SA Brief Intervention (yes/no): yes    MH Brief Intervention (yes/no): no    TMI Benefit: (yes/no) no    TMI Referral: (yes/no) no    Detox (yes/no) yes      Bridge/OP (yes/no) yes    Substance Use Treatment Referral: (yes/no): yes    Location: [facility]    Supportive Services Referral/Resources: (yes/no) yes    Location: [facility], [facility], [facility] pamphlets given    Warm Handoff: yes, [facility]    Outcome: Completed    Follow-up in 1 month: Pt agreed    Follow-up in 3 months: Pt agreed    Notes:  PT seeking Bridge due to [employer] losing their Suboxone provider. Pt has been with Suboxone since last ED Bridge 09/2020. Pt states her cocaine usage is purely recreational. Pt states she has been doing well since last ED Bridge appt. Pt is getting along better with her adoptive mother. Pt denied legal issues. PT is not currently working.

## 2021-01-11 NOTE — ED Triage Notes (Signed)
States she is here for the bridge program.

## 2021-01-11 NOTE — ED Nurses Note (Signed)
Patient discharged/given discharge instructions by [facility] provider Shanda Bumps.

## 2021-01-11 NOTE — ED Provider Notes (Signed)
[facility]  Emergency Department Visit Note    Date: 01/11/2021  Primary care provider: Richardson Dopp, DO  Means of arrival: private car  History obtained by: patient  History limited by: none      Chief Complaint: Medical screening exam    History of Present Illness     Ellen Dillon, date of birth Sep 14, 1981, is a 40 y.o. female who presents to the Emergency Department complaining of medical screening exam    Context:  Patient states that she is here for a medical screening exam to bridge.  States she has been in a Suboxone program for 6 years.  Using two 8 mg tablets daily (states unable to use the films).  States that she used her last strips today.  States that her provider kicked her out of the program because he "heard from someone that she was selling drugs."  Patient denies the same.  States she has already been in contact with [facility] and she is set up for a f/u appt. States that she used an "oxy" a week or so ago.  Denies any other drug use including methadone.  Denies SI or HI.  States she is on day one of her menstrual cycle and is having normal menstrual pain and is requesting motrin.  Denies any other physical complaints currently.  No pre-er treatment or meds.  No other c/o.  Pertinent Past Medical History:  See nursing    Review of Systems     The pertinent positive and negative symptoms are as per HPI. All other systems reviewed and are negative.    Patient History      Past Medical History:  Past Medical History:   Diagnosis Date   . Anxiety    . Diet controlled White classification A1 gestational diabetes mellitus (GDM) 08/07/2019   . Gestational hypertension 09/01/2019   . Gestational hypertension, third trimester 08/07/2019   . High-risk pregnancy in third trimester 07/17/2019   . Hx of spontaneous abortion, currently pregnant, third trimester    . Hypertension     due to pregnancy   . Multigravida of advanced maternal age in third trimester  07/17/2019   . 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    . 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 Overweight(278.02)    . Panic attack    . Seizure (CMS [facility])     age 65    . Tobacco smoking complicating pregnancy, third trimester 02/27/2019       Past Surgical History:  Past Surgical History:   Procedure Laterality Date   . Hx breast biopsy     . Hx cyst incision and drainage  LEFT   . Hx dilation and curettage  01/04/12        Family History:  Family Medical History:     Problem Relation (Age of Onset)    Diabetes Maternal Grandmother, Maternal Grandfather    Healthy Father, Mother, Brother            Social History:  Social History     Tobacco Use   . Smoking status: Current Every Day Smoker     Packs/day: 0.50     Years: 14.00     Pack years: 7.00     Types: Cigarettes   . Smokeless tobacco: Never Used   Substance Use Topics   . Alcohol use: No   . Drug use: No     Types: Other  Comment: denies/history of opiate pills and cocaine     Social History     Substance and Sexual Activity   Drug Use No   . Types: Other    Comment: denies/history of opiate pills and cocaine       Medications:  Outpatient Medications Marked as Taking for the 01/11/21 encounter [facility](Hospital Encounter)   Medication Sig   . buprenorphine-naloxone (SUBOXONE) 8-2 mg Sublingual Tablet, Sublingual Place 2 Tablets under the tongue Once a day for 3 days   . naloxone (NARCAN) 4 mg per spray nasal spray 1 Spray by INTRANASAL route Every 2 minutes as needed (for overdose)       Allergies:   Allergies   Allergen Reactions   . Penicillins      HIVES   . Propoxyphene      HIVES   . Sulfa (Sulfonamides)      HIVES   . Tramadol Swelling       Physical Exam     Vital Signs:  ED Triage Vitals [01/11/21 0855]   BP (Non-Invasive) (!) 143/107   Heart Rate 66   Respiratory Rate 16   Temp    SpO2 98 %   Weight 81.6 kg (180 lb)   Height 1.727 m (5\' 8" )       The initial visit vital signs are reviewed as above.     Pulse Ox: 98% on None (Room Air);  interpreted by me NW:GNFAOZas:Normal    GENERAL:  This is a well appearing 39yof patient who is interactive, appropriate and showing no outward signs of distress.  Non-toxic.  Ambulatory.  HEAD:  Atraumatic, normocephalic.  HEENT:  Conjunctival WNL, no gross deformity noted.  Mucous membranes moist.  NECK:  Supple, no meningeal signs.  HEART: RRR  LUNGS:  CTA  EXTREMITIES:  Moving all well.  SKIN:  Warm, good color.  No rashes noted.  NEURO/PSYCH:  Patient is interactive, appropriate and grossly intact.  Denies SI or HI.      Diagnostics     Labs:    Results for orders placed or performed during the hospital encounter of 01/11/21   COMPREHENSIVE METABOLIC PANEL, NON-FASTING   Result Value Ref Range    SODIUM 138 136 - 145 mmol/L    POTASSIUM 4.0 3.5 - 5.1 mmol/L    CHLORIDE 108 96 - 111 mmol/L    CO2 TOTAL 25 22 - 30 mmol/L    ANION GAP 5 4 - 13 mmol/L    BUN 11 8 - 25 mg/dL    CREATININE 3.080.70 6.570.60 - 1.05 mg/dL    BUN/CREA RATIO 16 6 - 22    ESTIMATED GFR >90 >=60 mL/min/BSA    ALBUMIN 4.0 3.5 - 5.0 g/dL     CALCIUM 9.0 8.5 - 84.610.0 mg/dL    GLUCOSE 962100 65 - 952125 mg/dL    ALKALINE PHOSPHATASE 68 40 - 110 U/L    ALT (SGPT) 15 8 - 22 U/L    AST (SGOT)  15 8 - 45 U/L    BILIRUBIN TOTAL 0.3 0.3 - 1.3 mg/dL    PROTEIN TOTAL 7.2 6.0 - 7.9 g/dL   ETHANOL, SERUM   Result Value Ref Range    ETHANOL None Detected    THYROID STIMULATING HORMONE WITH FREE T4 REFLEX   Result Value Ref Range    TSH 0.731 0.430 - 3.550 uIU/mL   HIV1/HIV2 SCREEN, COMBINED ANTIGEN AND ANTIBODY   Result Value Ref Range    HIV SCREEN, COMBINED ANTIGEN &  ANTIBODY Negative Negative   HEPATITIS A IGM ANTIBODY   Result Value Ref Range    HAV IGM Negative Negative   HEPATITIS B SURFACE ANTIGEN   Result Value Ref Range    HBV SURFACE ANTIGEN QUALITATIVE Negative Negative   HEPATITIS B CORE IGM, AB   Result Value Ref Range    HBV CORE IGM ANTIBODY QUALITATIVE Negative Negative   HEPATITIS C ANTIBODY SCREEN WITH REFLEX TO HCV PCR   Result Value Ref Range    HCV ANTIBODY  QUALITATIVE Negative Negative   DRUG SCREEN, NO CONFIRMATION, URINE   Result Value Ref Range    AMPHETAMINES, URINE Negative Negative    BARBITURATES URINE Negative Negative    BENZODIAZEPINES URINE Negative Negative    BUPRENORPHINE URINE Positive (A) Negative    CANNABINOIDS URINE Negative Negative    COCAINE METABOLITES URINE Positive (A) Negative    METHADONE URINE Negative Negative    OPIATES URINE (LOW CUTOFF) Negative Negative    OXYCODONE URINE Negative Negative    ECSTASY/MDMA URINE Negative Negative    FENTANYL, RANDOM URINE Negative Negative    CREATININE RANDOM URINE 55 50 - 100 mg/dL   URINALYSIS WITH MICROSCOPIC REFLEX IF INDICATED [facility] ONLY   Result Value Ref Range    COLOR Light Yellow Light Yellow, Straw, Yellow    APPEARANCE Clear Clear    PH 6.5 <8.0    LEUKOCYTES Negative Negative WBCs/uL    NITRITE Negative Negative    PROTEIN Negative Negative, 10  mg/dL    GLUCOSE Negative Negative mg/dL    KETONES Negative Negative mg/dL    UROBILINOGEN 0.2  <=6.8 mg/dL    BILIRUBIN Negative Negative mg/dL    BLOOD Large (A) Negative mg/dL    SPECIFIC GRAVITY 3.419 <1.022   HCG, URINE QUALITATIVE, PREGNANCY   Result Value Ref Range    HCG URINE QUALITATIVE Negative Negative   CBC WITH DIFF   Result Value Ref Range    WBC 5.0 3.7 - 11.0 x10^3/uL    RBC 5.60 (H) 3.85 - 5.22 x10^6/uL    HGB 12.3 11.5 - 16.0 g/dL    HCT 62.2 29.7 - 98.9 %    MCV 72.1 (L) 78.0 - 100.0 fL    MCH 22.0 (L) 26.0 - 32.0 pg    MCHC 30.4 (L) 31.0 - 35.5 g/dL    RDW-CV 21.1 94.1 - 74.0 %    PLATELETS 259 150 - 400 x10^3/uL    MPV 10.5 8.7 - 12.5 fL   URINALYSIS, MICROSCOPIC   Result Value Ref Range    RBCS 5-10 (A) 0 - 2 /hpf    WBCS 0-2 0 - 2 /hpf    BACTERIA None None /hpf    SQUAMOUS EPITHELIAL 0-2 0 - 2 /hpf    MUCOUS Slight (A) None, Light /hpf   MANUAL DIFF AND MORPHOLOGY-SYSMEX   Result Value Ref Range    NEUTROPHIL % 47 %    LYMPHOCYTE %  39 %    MONOCYTE % 7 %    EOSINOPHIL % 2 %    BASOPHIL % 0 %    REACTIVE LYMPHOCYTE % 5 %     NEUTROPHIL # 2.35 1.50 - 7.70 x10^3/uL    LYMPHOCYTE # 2.20 1.00 - 4.80 x10^3/uL    MONOCYTE # 0.35 0.20 - 1.10 x10^3/uL    EOSINOPHIL # 0.10 <=0.50 x10^3/uL    BASOPHIL # <0.10 <=0.20 x10^3/uL    HYPOCHROMASIA 2+/Moderate (A) None    TARGET CELLS 2+/Moderate (A)  None    LARGE PLATELETS Present (A) None     Labs reviewed and interpreted by me.    ED Progress Note/Medical Decision Making     PDMP:  461.  Last script should end today as per patient statement and PDMP.    Orders Placed This Encounter   . CBC/DIFF   . COMPREHENSIVE METABOLIC PANEL, NON-FASTING   . ETHANOL, SERUM   . THYROID STIMULATING HORMONE WITH FREE T4 REFLEX   . HIV1/HIV2 SCREEN, COMBINED ANTIGEN AND ANTIBODY   . HEPATITIS A IGM ANTIBODY   . HEPATITIS B SURFACE ANTIGEN   . HEPATITIS B CORE IGM, AB   . HEPATITIS C ANTIBODY SCREEN WITH REFLEX TO HCV PCR   . DRUG SCREEN, NO CONFIRMATION, URINE   . URINALYSIS WITH MICROSCOPIC REFLEX IF INDICATED [facility] ONLY   . HCG, URINE QUALITATIVE, PREGNANCY   . CBC WITH DIFF   . URINALYSIS, MICROSCOPIC   . MANUAL DIFF AND MORPHOLOGY-SYSMEX   . ECG 12-Lead   . ibuprofen (MOTRIN) tablet   . buprenorphine-naloxone (SUBOXONE) 8-2 mg Sublingual Tablet, Sublingual   . naloxone (NARCAN) 4 mg per spray nasal spray       Patient was evaluated.  Diagnostics as above.  Patient drug screen positive for Suboxone and cocaine.  As such, EKG obtained and showed NSR without acute ST or T wave changes present.  Patient labs otherwise unremarkable.  Patient has been seen by [facility].  She has an appt for f/u with [facility] at 0900 on 1/20.  She is given script for 3 days of Suboxone 8 mg tablets (can't use films).  Narcan script also provided.  To keep f/u appt and return here for concerns.  Related understanding to treatment and plan and was d/c to home in stable condition.   Supervising physician: Dr. Nedra Hai      Pre-Disposition Vitals:  Filed Vitals:    01/11/21 0855   BP: (!) 143/107   Pulse: 66   Resp: 16   SpO2: 98%       Clinical  Impression      1. Acute medical screening exam    Plan/Disposition     Discharged    Prescriptions:     Current Discharge Medication List      START taking these medications.      Details   buprenorphine-naloxone 8-2 mg Tablet, Sublingual  Commonly known as: SUBOXONE   2 Tablets, Sublingual, DAILY  Qty: 6 Tablet  Refills: 0        CONTINUE these medications which have CHANGED during your visit.      Details   naloxone 4 mg/actuation Spray, Non-Aerosol  Commonly known as: NARCAN  What changed:    reasons to take this   additional instructions   1 Spray, INTRANASAL, EVERY 2 MIN PRN  Qty: 1 Each  Refills: 0        CONTINUE these medications - NO CHANGES were made during your visit.      Details   Ibuprofen 800 mg Tablet  Commonly known as: MOTRIN   800 mg, Oral, EVERY 8 HOURS PRN  Refills: 0            Follow Up:  [facility]  [address]  [place], [place] [address]  [phone]  On 01/13/2021  at 9:00 AM    [facility] - Emergency Department  [facility]  [place] [address]  [phone]    If symptoms worsen  Condition on Disposition: Stable

## 2021-01-12 LAB — ECG 12-LEAD
Atrial Rate: 49 {beats}/min
Calculated P Axis: 92 degrees
Calculated R Axis: 65 degrees
Calculated T Axis: 52 degrees
PR Interval: 222 ms
QRS Duration: 78 ms
QT Interval: 430 ms
QTC Calculation: 388 ms
Ventricular rate: 49 {beats}/min

## 2021-01-13 ENCOUNTER — Encounter (INDEPENDENT_AMBULATORY_CARE_PROVIDER_SITE_OTHER): Payer: Self-pay | Admitting: FAMILY PRACTICE

## 2021-01-13 ENCOUNTER — Ambulatory Visit (INDEPENDENT_AMBULATORY_CARE_PROVIDER_SITE_OTHER): Payer: Medicaid Other | Admitting: FAMILY PRACTICE

## 2021-01-13 ENCOUNTER — Other Ambulatory Visit: Payer: Self-pay

## 2021-01-13 VITALS — BP 166/91 | HR 101 | Temp 98.9°F | Ht 68.5 in | Wt 184.2 lb

## 2021-01-13 DIAGNOSIS — F419 Anxiety disorder, unspecified: Secondary | ICD-10-CM

## 2021-01-13 DIAGNOSIS — F1120 Opioid dependence, uncomplicated: Secondary | ICD-10-CM

## 2021-01-13 DIAGNOSIS — F1190 Opioid use, unspecified, uncomplicated: Secondary | ICD-10-CM

## 2021-01-13 DIAGNOSIS — Z6827 Body mass index (BMI) 27.0-27.9, adult: Secondary | ICD-10-CM

## 2021-01-13 MED ORDER — SERTRALINE 25 MG TABLET
25.0000 mg | ORAL_TABLET | Freq: Every day | ORAL | 0 refills | Status: DC
Start: 2021-01-13 — End: 2021-02-17
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
## 2021-01-13 NOTE — Nursing Note (Signed)
Pt came in for an intake into the [facility] program. Pt was given a urine drug screen. Pt admits to [facility]. Pt positive for BUP, COC, THC. Will send out for levels and confirmation.     Juleen Starr, MA  01/13/2021, 09:41

## 2021-01-14 ENCOUNTER — Telehealth: Payer: Medicaid Other | Admitting: Counselor - Mental Health Counselor

## 2021-01-14 ENCOUNTER — Other Ambulatory Visit (INDEPENDENT_AMBULATORY_CARE_PROVIDER_SITE_OTHER): Payer: Self-pay | Admitting: FAMILY PRACTICE

## 2021-01-14 DIAGNOSIS — F1121 Opioid dependence, in remission: Secondary | ICD-10-CM

## 2021-01-14 DIAGNOSIS — F1120 Opioid dependence, uncomplicated: Secondary | ICD-10-CM

## 2021-01-14 MED ORDER — BUPRENORPHINE 8 MG-NALOXONE 2 MG SUBLINGUAL TABLET
2.0000 | SUBLINGUAL_TABLET | Freq: Every day | SUBLINGUAL | 0 refills | Status: DC
Start: 2021-01-14 — End: 2021-01-20

## 2021-01-14 NOTE — Progress Notes (Signed)
Patient completed her individual therapy.  New script sent in to get her to group on Thursday 1/27 at 1 pm.     Arlyn Dunning, MD  01/14/2021, 09:19

## 2021-01-18 ENCOUNTER — Encounter (INDEPENDENT_AMBULATORY_CARE_PROVIDER_SITE_OTHER): Payer: Self-pay

## 2021-01-20 ENCOUNTER — Encounter (INDEPENDENT_AMBULATORY_CARE_PROVIDER_SITE_OTHER): Payer: Self-pay | Admitting: FAMILY PRACTICE

## 2021-01-20 ENCOUNTER — Telehealth: Payer: Medicaid Other | Admitting: Counselor - Mental Health Counselor

## 2021-01-20 ENCOUNTER — Telehealth: Payer: Medicaid Other | Admitting: FAMILY PRACTICE

## 2021-01-20 DIAGNOSIS — F1120 Opioid dependence, uncomplicated: Secondary | ICD-10-CM

## 2021-01-20 DIAGNOSIS — F1121 Opioid dependence, in remission: Secondary | ICD-10-CM

## 2021-01-20 DIAGNOSIS — Z79899 Other long term (current) drug therapy: Secondary | ICD-10-CM

## 2021-01-20 MED ORDER — BUPRENORPHINE 8 MG-NALOXONE 2 MG SUBLINGUAL TABLET
2.0000 | SUBLINGUAL_TABLET | Freq: Every day | SUBLINGUAL | 0 refills | Status: DC
Start: 2021-01-20 — End: 2021-01-27
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
## 2021-01-21 NOTE — Behavioral Health (Signed)
01/21/21  15:05    SUD completed [facility] follow up call with pt. Pt states she really like the staff at [facility] and has attended appts and groups since her [facility] appt. Pt states she has not used other substance since her ED visit.

## 2021-01-24 NOTE — Progress Notes (Signed)
[facility]  OPERATED BY [facility] MEDICINE AND [facility] PHYSICI[facility]    [facility] - [address]  [address]  [place], [place] [address]  [phone]      Group Therapy Progress Note      TELEMEDICINE DOCUMENTATION:    Patient Location:  MyChart video visit from home address: [address]  [facility] [address]    Patient/family aware of provider location:  yes  Patient/family consent for telemedicine:  yes  Examination observed and performed by:  Mathis Fare, LPC        [name]  Date of Service:  01/20/2021  TIME:  1330-1430  DURATION:  60 min  NUMBER IN GROUP:  7          CHIEF COMPLAINT:  Group Therapy CPT 13244 Addiction Problem on [facility] Maintenance Program.  *Dr. Harvie Bridge is the Supervising Physician for the Group.      Subjective:  Ellen Dillon is seen for virtual group therapy. She is participating in a personal recovery program, which includes attending this Comprehensive Opioid Addiction Treatment Program.  Ellen Dillon reports no relapses since last group session.  Ellen Dillon states she did not attend any recovery support meetings for the week, but was encouraged to do so moving forward.   Ellen Dillon reports maintaining recovery and being compliant with medications.      Group began with individual check ins and introductions as needed.    Review of Last week's Group Topic- Patients denied having anything further to discuss from last week related to acceptance.    Group Topic- Group focused on exploring feelings of guilt and shame related to addiction.  Identified current core beliefs related to self using journaling technique.  Patients shared these self perceptions with group members, solicited feedback, and explored whether current view of self was self-limiting or self-promoting.  Identified ways of fostering increased self-compassion and letting go of these guilt and shame feelings to further recovery.    Homework Assigned- Be kind to self.  Do something  nice or self-compassionate this week.  Take a minute to acknowledge the hard work you are doing for your recovery.          Observation:  Mood: within normal limits  Affect: stable      Thought process: within normal limits  Participation: Patient participated appropriately and actively in the group session.      Assessment:     ICD-10-CM    1. Opioid use disorder, severe, on maintenance therapy, dependence (CMS HCC)  F11.20          Procedure:  01027 -  Patient attended [facility] Group. This is a weekly process group designed to be appropriate for individuals with a substance use disorder.  The focus of this group was to provide supportive therapy, establish mutual support, and provide psycho-education for the biological aspects of addiction, recovery, and the efficacy of  the development of a recovery plan.   Encouragement of attendance of recovery meetings, identification of triggers for use/relapse and the development of a supportive social network were also covered.  Therapist utilized Motivational Interviewing, Cognitive Behavioral Therapy and person centered strategies to provide evidence based and supportive therapy. Therapist facilitated group support and discussion to include relapse prevention, building a sound recovery program, and highlight the practice of new recovery tools.     Treatment Plan:    Abstinence    Medication compliance   Increase effective coping skills   Decrease stress   Participate  in weekly group therapy   Participate in monthly OP individual therapy   Open communication and honesty    Follow recommendations of treatment         Mathis Fare, LPC  01/24/2021, 15:28

## 2021-01-25 ENCOUNTER — Encounter (INDEPENDENT_AMBULATORY_CARE_PROVIDER_SITE_OTHER): Payer: Self-pay

## 2021-01-26 ENCOUNTER — Encounter (INDEPENDENT_AMBULATORY_CARE_PROVIDER_SITE_OTHER): Payer: Self-pay

## 2021-01-26 ENCOUNTER — Telehealth (INDEPENDENT_AMBULATORY_CARE_PROVIDER_SITE_OTHER): Payer: Self-pay

## 2021-01-26 NOTE — Telephone Encounter (Signed)
Patient was contacted for a random urine drug screen. A voicemail was left notifying that she has 24 hrs to complete, contact information was included, and was informed to bring her prescription to the appt. MyChart message will be sent as well.

## 2021-01-27 ENCOUNTER — Telehealth: Payer: Medicaid Other | Admitting: Counselor - Mental Health Counselor

## 2021-01-27 ENCOUNTER — Ambulatory Visit (INDEPENDENT_AMBULATORY_CARE_PROVIDER_SITE_OTHER): Payer: Medicaid Other

## 2021-01-27 ENCOUNTER — Other Ambulatory Visit: Payer: Self-pay

## 2021-01-27 ENCOUNTER — Telehealth: Payer: Medicaid Other | Admitting: FAMILY PRACTICE

## 2021-01-27 ENCOUNTER — Encounter (INDEPENDENT_AMBULATORY_CARE_PROVIDER_SITE_OTHER): Payer: Self-pay | Admitting: FAMILY PRACTICE

## 2021-01-27 DIAGNOSIS — F1121 Opioid dependence, in remission: Secondary | ICD-10-CM

## 2021-01-27 DIAGNOSIS — F1120 Opioid dependence, uncomplicated: Secondary | ICD-10-CM

## 2021-01-27 DIAGNOSIS — Z79899 Other long term (current) drug therapy: Secondary | ICD-10-CM

## 2021-01-27 MED ORDER — BUPRENORPHINE 8 MG-NALOXONE 2 MG SUBLINGUAL TABLET
2.0000 | SUBLINGUAL_TABLET | Freq: Every day | SUBLINGUAL | 0 refills | Status: DC
Start: 2021-01-27 — End: 2021-02-03
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
## 2021-01-27 NOTE — Nursing Note (Signed)
Pt came in for a urine drug screen. Pt admits to [facility] and COC use. Pt positive for COC, THC, and BUP. Will send out for levels and confirmation.     Juleen Starr, MA  01/27/2021, 15:17

## 2021-01-27 NOTE — Progress Notes (Signed)
[facility]YCHIATRY  OPERATED BY [facility] MEDICINE AND [facility] PHYSICIANS    [facility] - [address]  [address]  [place], [place] [address]  [phone]      Group Therapy Progress Note      TELEMEDICINE DOCUMENTATION:    Patient Location:  MyChart video visit from home address: [address]  [facility] [address]    Patient/family aware of provider location:  yes  Patient/family consent for telemedicine:  yes  Examination observed and performed by:  Mathis Fare, LPC        Ellen Dillon  Date of Service:  01/27/2021  TIME:  1325-1407  DURATION:  42 min  NUMBER IN GROUP:  7          CHIEF COMPLAINT:  Group Therapy CPT 56153 Addiction Problem on COAT Maintenance Program.  *Dr. Harvie Bridge is the Supervising Physician for the Group.      Subjective:  Ellen Dillon is seen for virtual group therapy. She is participating in a personal recovery program, which includes attending this Comprehensive Opioid Addiction Treatment Program.  Tandra reports no relapses since last group session.  Avya reports attending 2 support recovery podcast meetings for the week.   Jahnaya reports maintaining recovery and being compliant with medications.     Group began with individual check ins and introductions as needed.  Group members shared a fun fact about themselves to increase group cohesion and build rapport.    Review of Last week's Group Topic- Continued process group therapy discussion centered around feelings of guilt and shame.  Group members shared their personal struggles with feelings of guilt and shame related to their addiction.   Members also shared what they did for their homework assignment from last week to do something kind for themselves.    Group Topic- Process therapy explored ways to overcome feelings of guilt and shame which can perpetuate addiction.  Discussion centered around forgiveness of self, forgiveness of others, and building a recovery support  system.    Homework Assigned- Group members were asked to engage in intentional self-care activities this week.      Observation:  Mood: within normal limits  Affect: consistent with mood      Thought process: within normal limits  Participation: Full      Assessment:     ICD-10-CM    1. Opioid use disorder, severe, on maintenance therapy, dependence (CMS HCC)  F11.20          Procedure:  79432 -  Patient attended COAT Group. This is a weekly process group designed to be appropriate for individuals with a substance use disorder.  The focus of this group was to provide supportive therapy, establish mutual support, and provide psycho-education for the biological aspects of addiction, recovery, and the efficacy of  the development of a recovery plan.   Encouragement of attendance of recovery meetings, identification of triggers for use/relapse and the development of a supportive social network were also covered.  Therapist utilized Motivational Interviewing, Cognitive Behavioral Therapy and person centered strategies to provide evidence based and supportive therapy. Therapist facilitated group support and discussion to include relapse prevention, building a sound recovery program, and highlight the practice of new recovery tools.     Treatment Plan:    Abstinence    Medication compliance   Increase effective coping skills   Decrease stress   Participate in weekly group therapy   Participate in monthly OP individual therapy   Open communication  and honesty    Follow recommendations of treatment         Mathis Fare, [facility]  01/27/2021, 16:25

## 2021-01-31 ENCOUNTER — Encounter (INDEPENDENT_AMBULATORY_CARE_PROVIDER_SITE_OTHER): Payer: Self-pay | Admitting: Student in an Organized Health Care Education/Training Program

## 2021-02-03 ENCOUNTER — Encounter (INDEPENDENT_AMBULATORY_CARE_PROVIDER_SITE_OTHER): Payer: Self-pay | Admitting: FAMILY PRACTICE

## 2021-02-03 ENCOUNTER — Telehealth: Payer: Medicaid Other | Admitting: FAMILY PRACTICE

## 2021-02-03 ENCOUNTER — Telehealth (INDEPENDENT_AMBULATORY_CARE_PROVIDER_SITE_OTHER): Payer: Self-pay

## 2021-02-03 ENCOUNTER — Encounter (INDEPENDENT_AMBULATORY_CARE_PROVIDER_SITE_OTHER): Payer: Self-pay

## 2021-02-03 ENCOUNTER — Telehealth: Payer: Medicaid Other | Admitting: Counselor - Mental Health Counselor

## 2021-02-03 DIAGNOSIS — F1121 Opioid dependence, in remission: Secondary | ICD-10-CM

## 2021-02-03 DIAGNOSIS — F1120 Opioid dependence, uncomplicated: Secondary | ICD-10-CM

## 2021-02-03 DIAGNOSIS — Z79899 Other long term (current) drug therapy: Secondary | ICD-10-CM

## 2021-02-03 MED ORDER — BUPRENORPHINE 8 MG-NALOXONE 2 MG SUBLINGUAL TABLET
2.0000 | SUBLINGUAL_TABLET | Freq: Every day | SUBLINGUAL | 0 refills | Status: DC
Start: 2021-02-03 — End: 2021-02-04
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
## 2021-02-03 NOTE — Telephone Encounter (Signed)
Patient was advised that she will need to contact the COAT team prior to group to inform if she has relapsed or tell the team during group. Patient stated that she will call prior to the group as she is still not comfortable sharing that with the group. Patient was advised that she will need to come screen tomorrow. Patient was notified that she will be provided a prescription through tomorrow and once the patient screens, she will be provided a prescription through next Thursday.

## 2021-02-03 NOTE — Progress Notes (Signed)
[facility] BEHAVIORAL MEDICINE AND PSYCHIATRY  OPERATED BY [facility] MEDICINE AND [facility] PHYSICIANS    [facility] - [address]  [address]  [place], [place] [address]  [phone]      Group Therapy Progress Note      TELEMEDICINE DOCUMENTATION:    Patient Location:  MyChart video visit from home address: [address]  [facility] [address]    Patient/family aware of provider location:  yes  Patient/family consent for telemedicine:  yes  Examination observed and performed by:  Mathis Fare, LPC        [name]  Date of Service:  02/03/2021  TIME:  1325-1400  DURATION:  35 min  NUMBER IN GROUP:  2          CHIEF COMPLAINT:  Group Therapy CPT 33825 Addiction Problem on COAT Maintenance Program.  *Dr. Harvie Bridge is the Supervising Physician for the Group.      Subjective:  Ellen Dillon is seen for virtual group therapy. She/He is participating in a personal recovery program, which includes attending this Comprehensive Opioid Addiction Treatment Program.  Ellen Dillon reports marijuana use since last group session.  Ellen Dillon reports she did not attend any support recovery meetings for the week.  She states she has difficulty getting to meetings due to her current situation and would like to be allowed to listen to recovery oriented podcasts.  She was encouraged to attend virtual NA meetings as those are readily available currently.  She was sent a link to access these by casemanager.   Ellen Dillon reports being compliant with medications.     Group began with individual check ins and introductions as needed.    Review of the past week.  Participants identified a challenge they faced this week as well as how they handled this challenge.  Ellen Dillon identified that she has been managing her daughter's tantrums and toddler resistance by setting firm boundaries and practicing assertive communication.  She reports she has a tendency to be more passive and accommodating so she is trying to step out of her comfort  zone with this.    Group Topic- Relapse prevention.  Discussed personal benefits associated with marijuana use and explored alternative ways of meeting those functions without relapse.  Specifically, she identified that using marijuana helps her with sleep, relaxation, and is habitual/social.  Identified alternative behaviors as setting a sleep hygiene schedule, stretching, progressive muscle relaxation, and removing self from the situation/environment when others are going to use marijuana in her presence.  Practiced coping strategies of deep breathing and introduced grounding exercise of 05397.  She states that Suboxone has been great to assist in managing her opiate addiction and sustaining her sobriety from opiates for approximately 7 years.    Homework-None assigned.  She did identify a personal goal of working to get her driver's license.  She states she plans to read 1-2 chapters in her study book.      Observation:  Mood: restless at times and within normal limits  Affect: consistent with mood      Thought process: within normal limits  Participation: Full Active throughout group.      Assessment:     ICD-10-CM    1. Opioid use disorder, severe, on maintenance therapy, dependence (CMS HCC)  F11.20          Procedure:  67341 -  Patient attended COAT Group. This is a weekly process group designed to be appropriate for individuals with a substance use disorder.  The  focus of this group was to provide supportive therapy, establish mutual support, and provide psycho-education for the biological aspects of addiction, recovery, and the efficacy of  the development of a recovery plan.   Encouragement of attendance of recovery meetings, identification of triggers for use/relapse and the development of a supportive social network were also covered.  Therapist utilized Motivational Interviewing, Cognitive Behavioral Therapy and person centered strategies to provide evidence based and supportive therapy. Therapist  facilitated group support and discussion to include relapse prevention, building a sound recovery program, and highlight the practice of new recovery tools.     Treatment Plan:    Abstinence    Medication compliance   Increase effective coping skills   Decrease stress   Participate in weekly group therapy   Participate in monthly OP individual therapy   Open communication and honesty    Follow recommendations of treatment         Mathis Fare, LPC  02/03/2021, 14:58

## 2021-02-04 ENCOUNTER — Ambulatory Visit (INDEPENDENT_AMBULATORY_CARE_PROVIDER_SITE_OTHER): Payer: Medicaid Other

## 2021-02-04 ENCOUNTER — Other Ambulatory Visit (INDEPENDENT_AMBULATORY_CARE_PROVIDER_SITE_OTHER): Payer: Self-pay | Admitting: FAMILY PRACTICE

## 2021-02-04 ENCOUNTER — Other Ambulatory Visit: Payer: Self-pay

## 2021-02-04 DIAGNOSIS — F1121 Opioid dependence, in remission: Secondary | ICD-10-CM

## 2021-02-04 MED ORDER — BUPRENORPHINE 8 MG-NALOXONE 2 MG SUBLINGUAL TABLET
2.0000 | SUBLINGUAL_TABLET | Freq: Every day | SUBLINGUAL | 0 refills | Status: DC
Start: 2021-02-04 — End: 2021-02-10

## 2021-02-04 NOTE — Nursing Note (Signed)
Pt came in for a random urine drug screen. Pt denies any drug use or relapse. Pt positive for BUP. Will send out for levels and confirmation.     Josselyne Onofrio, MA  02/04/2021, 13:40

## 2021-02-04 NOTE — Progress Notes (Signed)
Patient screened.  Admitted to using cocaine and THC 3 days ago.  Informed her explicitly that she will need to tell us about relapses before group.  She demonstrated understanding.      Arlyn Dunning, MD  02/04/2021, 11:11

## 2021-02-04 NOTE — Nursing Note (Signed)
Correction to previous note:     Pt came in for a urine drug screen. Pt admits to THC, COC and Bup use. Pt positive for all 3. Will send out for levels and confirmation.    Juleen Starr, MA  02/04/2021, 13:49

## 2021-02-04 NOTE — Progress Notes (Signed)
I discussed the patient's care with the Resident prior to the patient leaving the clinic. Any significant discussion points are noted.    Arlyn Dunning, MD 02/04/2021, 13:59

## 2021-02-09 ENCOUNTER — Ambulatory Visit (INDEPENDENT_AMBULATORY_CARE_PROVIDER_SITE_OTHER): Payer: Medicaid Other | Admitting: Student in an Organized Health Care Education/Training Program

## 2021-02-09 ENCOUNTER — Other Ambulatory Visit: Payer: Self-pay

## 2021-02-09 VITALS — BP 164/80 | HR 56 | Temp 96.9°F | Wt 187.0 lb

## 2021-02-09 DIAGNOSIS — R718 Other abnormality of red blood cells: Secondary | ICD-10-CM

## 2021-02-09 DIAGNOSIS — R232 Flushing: Secondary | ICD-10-CM

## 2021-02-09 DIAGNOSIS — Z6828 Body mass index (BMI) 28.0-28.9, adult: Secondary | ICD-10-CM

## 2021-02-09 DIAGNOSIS — F419 Anxiety disorder, unspecified: Secondary | ICD-10-CM

## 2021-02-09 NOTE — Progress Notes (Signed)
FAMILY MEDICINE, [facility] MEDICAL OFFICE BUILDING  [address]  [place] [address]    Acute Visit     Name: Ellen Dillon MRN:  [id number]   Date: 02/09/2021 Age: 40 y.o.         Chief Complaint(s):   Chief Complaint   Patient presents with   . Labs Only       SUBJECTIVE:  Ellen Dillon is a 40 y.o. female presenting today wanting to discuss labs that were ordered about a year ago when she presented with acute complaint of hot flashes, back pain, headaches, and abnormal menses post-partum. Her labs at that time were unremarkable CBC and CMP. TSH was low but T4 was normal. LH and FSH were elevated. She was referred to OB/Gyn for further evaluation and management but patient reports never hearing from anyone.    Patient has gone to the ED twice recently and had urine drug screen with amphetamines, buprenoprhine, cannabinoids, cocaine, opiates, and oxycodone in October followed by buprenorphine and cocaine in January of this year. Both ED visits were for clearance for the bridge program after falling out of touch with her outpatient Suboxone treatment. Patient recently established with Behavioral Health and is working with Dr. Harvie Bridge. She is back on Suboxone treatment. She was prescribed Zoloft but states she has not started it yet because she had a bad reaction to Wellbutrin and was afraid. She asks today if there are medications she can take as-needed for anxiety instead of another medication she has to take every day. She also does not like the idea of having to taper off of something if she gets started.    She additionally notes that she was advised to have her blood counts further evaluated after some abnormalities appeared on her CBC.      Review of Systems   Constitutional: Negative for chills, fever and malaise/fatigue.   Respiratory: Negative for cough, shortness of breath and wheezing.    Cardiovascular: Negative for chest pain, palpitations and leg swelling.    Genitourinary:        +irregular menses       Past Medical History:  Past Medical History:   Diagnosis Date   . Anxiety    . Diet controlled White classification A1 gestational diabetes mellitus (GDM) 08/07/2019   . Gestational hypertension 09/01/2019   . Gestational hypertension, third trimester 08/07/2019   . High-risk pregnancy in third trimester 07/17/2019   . Hx of spontaneous abortion, currently pregnant, third trimester    . Hypertension     due to pregnancy   . Multigravida of advanced maternal age in third trimester 07/17/2019   . 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    . 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 Overweight(278.02)    . Panic attack    . Seizure (CMS [facility])     age 55    . Tobacco smoking complicating pregnancy, third trimester 02/27/2019           Medications:  Current Outpatient Medications   Medication Sig   . buprenorphine-naloxone (SUBOXONE) 8-2 mg Sublingual Tablet, Sublingual Place 2 Tablets under the tongue Once a day for 6 days   . naloxone (NARCAN) 4 mg per spray nasal spray 1 Spray by INTRANASAL route Every 2 minutes as needed (for overdose)   . sertraline (ZOLOFT) 25 mg Oral Tablet Take 1 Tablet (25 mg total) by mouth Once a day for 30 days Indications: repeated episodes of anxiety (Patient not  taking: Reported on 02/09/2021)        Allergies:  Allergies   Allergen Reactions   . Penicillins      HIVES   . Propoxyphene      HIVES   . Sulfa (Sulfonamides)      HIVES   . Suboxone [Buprenorphine-Naloxone]  Other Adverse Reaction (Add comment)     **Only the strips**  Gives stroke level bp   . Tramadol Swelling        Social History:  Social History     Tobacco Use   . Smoking status: Current Every Day Smoker     Packs/day: 0.50     Years: 14.00     Pack years: 7.00     Types: Cigarettes   . Smokeless tobacco: Never Used   Substance Use Topics   . Alcohol use: No   . Drug use: No     Types: Other     Comment: denies/history of opiate pills and cocaine        Family  History:  Family Medical History:     Problem Relation (Age of Onset)    Diabetes Maternal Grandmother, Maternal Grandfather    Healthy Father, Mother, Brother            OBJECTIVE:  BP (!) 164/80   Pulse 56   Temp 36.1 C (96.9 F)   Wt 84.8 kg (187 lb)   BMI 28.02 kg/m         Physical Exam  Constitutional:       General: She is not in acute distress.  HENT:      Head: Normocephalic and atraumatic.   Eyes:      Conjunctiva/sclera: Conjunctivae normal.   Pulmonary:      Effort: Pulmonary effort is normal. No respiratory distress.   Neurological:      Mental Status: She is alert and oriented to person, place, and time.      GCS: GCS score is 15.   Psychiatric:         Mood and Affect: Mood and affect normal.         Cognition and Memory: Memory normal.         Judgment: Judgment normal.             ASSESSMENT and PLAN:    ICD-10-CM    1. Microcytosis  R71.8 IRON TRANSFERRIN AND TIBC     FERRITIN   2. Hot flashes  R23.2 Refer to [facility]   3. Anxiety  F41.9        Will check iron studies.    Referral to Gyn given elevated LH and FSH in patient with irregular menses and hot flashes.    Advised patient to further discuss medication options with Dr. Harvie Bridge since she is established there for behavioral health.    RTC PRN    Richardson Dopp, DO  02/09/2021, 12:59
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
## 2021-02-10 ENCOUNTER — Encounter (INDEPENDENT_AMBULATORY_CARE_PROVIDER_SITE_OTHER): Payer: Self-pay | Admitting: FAMILY PRACTICE

## 2021-02-10 ENCOUNTER — Other Ambulatory Visit (INDEPENDENT_AMBULATORY_CARE_PROVIDER_SITE_OTHER): Payer: Self-pay | Admitting: FAMILY PRACTICE

## 2021-02-10 ENCOUNTER — Telehealth: Payer: Medicaid Other | Admitting: FAMILY PRACTICE

## 2021-02-10 ENCOUNTER — Telehealth: Payer: Medicaid Other | Admitting: Counselor - Mental Health Counselor

## 2021-02-10 DIAGNOSIS — F1121 Opioid dependence, in remission: Secondary | ICD-10-CM

## 2021-02-10 DIAGNOSIS — Z79899 Other long term (current) drug therapy: Secondary | ICD-10-CM

## 2021-02-10 DIAGNOSIS — F1120 Opioid dependence, uncomplicated: Secondary | ICD-10-CM

## 2021-02-10 MED ORDER — BUPRENORPHINE 8 MG-NALOXONE 2 MG SUBLINGUAL TABLET
2.0000 | SUBLINGUAL_TABLET | Freq: Every day | SUBLINGUAL | 0 refills | Status: DC
Start: 2021-02-10 — End: 2021-02-17

## 2021-02-10 NOTE — Progress Notes (Signed)
[facility]IAT[facility]  OPERATED BY  [facility] MEDICINE AND [facility] PHYSICIANS    [facility] - [address]  [address]  [place], [place] [address]  [phone]      Group Therapy Progress Note      TELEMEDICINE DOCUMENTATION:    Patient Location:  MyChart video visit from home address: [address]  [facility] [address]    Patient/family aware of provider location:  yes  Patient/family consent for telemedicine:  yes  Examination observed and performed by:  Mathis Fare, LPC        Ellen Dillon  Date of Service:  02/10/2021  TIME:  1330-1430  DURATION:  60 min  NUMBER IN GROUP:  7          CHIEF COMPLAINT:  Group Therapy CPT 83662 Addiction Problem on COAT Maintenance Program.  *Dr. Harvie Bridge is the Supervising Physician for the Group.      Subjective:  Ellen Dillon is seen for virtual group therapy. She/He is participating in a personal recovery program, which includes attending this Comprehensive Opioid Addiction Treatment Program.  [name] reports attending support recovery meetings for the week.   [name] reports being compliant with medications.     Group began with individual check ins and introductions as needed.    Group Topic- Group discussion centered around coping strategies for relapse prevention.  Explored healthy and unhealthy coping strategies including diversions, interpersonal/social, cognitive, tension releasers, physical, and limit setting.  Participants identified personal coping practices and identified areas noted for improvement.      Homework Assigned- Each group member identified one unhealthy coping strategy to decrease and one healthy coping strategy that they wanted to do more often.  Members were encouraged to focus on these self-identified goals this week for further discussion next session.      Observation:  Mood: within normal limits  Affect: stable      Thought process: within normal limits  Thought Content:  Within normal  limits  Interpersonal:  Discussed issues, Attentive, Displayed insight and Provided feedback  Participation: Full      Assessment:     ICD-10-CM    1. 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 This is a weekly process group designed to be appropriate for individuals with a substance use disorder.  The focus of this group was to provide supportive therapy, establish mutual support, and provide psycho-education for the biological aspects of addiction, recovery, and the efficacy of  the development of a recovery plan.   Encouragement of attendance of recovery meetings, identification of triggers for use/relapse and the development of a supportive social network were also covered.  Therapist utilized Motivational Interviewing, Cognitive Behavioral Therapy and person centered strategies to provide evidence based and supportive therapy. Therapist facilitated group support and discussion to include relapse prevention, building a sound recovery program, and highlight the practice of new recovery tools.       Treatment Plan:    Abstinence    Medication compliance   Increase effective coping skills   Decrease stress   Participate in weekly group therapy   Participate in monthly OP individual therapy   Open communication and honesty    Follow recommendations of treatment         Mathis Fare, LPC  02/10/2021, 15:02

## 2021-02-10 NOTE — Progress Notes (Signed)
BEHAVIORAL MEDICINE, [facility]   [address]  [place] [address]     Outpatient COAT Progress Note    Name: Ellen Dillon MRN:  [id number]   Date: 02/10/2021 Age: 40 y.o.         Ellen Dillon was seen in the COAT Clinic. This clinic visit included medication management, addiction education, and evaluation of progress in recovery.      Subjective:     Intake: Prescribed opioids at age 39 with Lorcets 10's and then went to Percocet 5's, and then one day at age 34 her doc said he wasn't going to prescribe them any more.  Bought them off the street for a long time(mother helped her buy them).  Then became pregnant and wasn't using during that time.  Ended up having a miscarriage.  Went with [facility] for 6 years, but go discharged for missing a random.  Went to [facility] for a month, but didn't like the group and the doc, and the tele visit.  Allergic to the films.  Tablets do fine for her.  Left there in September 2021, and then went to bridge program and went to [place] from Oct until 2 weeks ago.  She states that  They told her that another patient had told them she had offered them drugs, which she states she hasn't.  Raped at age 75, and states that she doesn't think about it.  Used to get counseling for that, but no longer needs it.     1/27: Patient has 7 days sober. Denies c, wd, se.  Did not go to any meetings.  Doing well.  No new concerns.     2/3: Patient has 14 days sober.  Denies c, wd, se. 2 podcasts and no meetings. No concerns.     2/10: Patient has 2 days sober.   Denies c, wd, se.  Went to [facility] [employer] [facility].  She denied use last week, but when she came to screen she admitted to staff that she had used.  Stated she wasn't comfortable revealing this to the group.  Will explicitly states that she needs to call and let us know before group or during group of any relapses.      2/17: Patient has 2 days sober, smoked marijuana.  Denies c, wd, se.  Went to 2  meetings.  Had a bad week.     Current MAT dose: 16 mg Daily    Objective:  Video Visit.  Patient is in no acute distress, alert and oriented, and dressed appropriately.  Speech is clear and coherent.  Thought process is linear.  No indication of responding to internal stimuli.      Urine drug screen review:  Intake: 1/20: Bup, Coc, EtOH  2/3: Bup, Coc, EtOH  2/11: Bup, Coc, EtOH    BOP Review Date: 01/13/21    Lab Review:   Hepatitis A Neg  Hepatitis B Neg  Hepatitis C Neg  HIV  Neg  EtOH None Detected  LFTs Unremarkable      Diagnoses and all orders for this visit:    Opioid use disorder, severe, in early remission, on maintenance therapy (CMS HCC)  -     buprenorphine-naloxone (SUBOXONE) 8-2 mg Sublingual Tablet, Sublingual; Place 2 Tablets under the tongue Once a day for 7 days          Continue current dose of Suboxone and RTO in 1 week for COAT.    Arlyn Dunning, MD  02/10/2021, 14:26      TELEMEDICINE DOCUMENTATION:    Patient Location:  MyChart video visit from home address: [address]  [place] [address]    Patient/family aware of provider location:  yes  Patient/family consent for telemedicine:  yes  Examination observed and performed by:  Arlyn Dunning, MD

## 2021-02-11 ENCOUNTER — Encounter (INDEPENDENT_AMBULATORY_CARE_PROVIDER_SITE_OTHER): Payer: Self-pay | Admitting: Counselor - Mental Health Counselor

## 2021-02-17 ENCOUNTER — Telehealth: Payer: Medicaid Other | Admitting: FAMILY PRACTICE

## 2021-02-17 ENCOUNTER — Encounter (INDEPENDENT_AMBULATORY_CARE_PROVIDER_SITE_OTHER): Payer: Self-pay | Admitting: FAMILY PRACTICE

## 2021-02-17 ENCOUNTER — Ambulatory Visit (INDEPENDENT_AMBULATORY_CARE_PROVIDER_SITE_OTHER): Payer: Medicaid Other

## 2021-02-17 ENCOUNTER — Telehealth: Payer: Medicaid Other | Admitting: Counselor - Mental Health Counselor

## 2021-02-17 ENCOUNTER — Other Ambulatory Visit: Payer: Self-pay

## 2021-02-17 DIAGNOSIS — F1120 Opioid dependence, uncomplicated: Secondary | ICD-10-CM

## 2021-02-17 DIAGNOSIS — F1121 Opioid dependence, in remission: Secondary | ICD-10-CM

## 2021-02-17 DIAGNOSIS — Z79899 Other long term (current) drug therapy: Secondary | ICD-10-CM

## 2021-02-17 DIAGNOSIS — F1190 Opioid use, unspecified, uncomplicated: Secondary | ICD-10-CM

## 2021-02-17 DIAGNOSIS — F419 Anxiety disorder, unspecified: Secondary | ICD-10-CM

## 2021-02-17 MED ORDER — BUPRENORPHINE 8 MG-NALOXONE 2 MG SUBLINGUAL TABLET
2.0000 | SUBLINGUAL_TABLET | Freq: Every day | SUBLINGUAL | 0 refills | Status: DC
Start: 2021-02-17 — End: 2021-02-17

## 2021-02-17 MED ORDER — TRAZODONE 50 MG TABLET
50.0000 mg | ORAL_TABLET | Freq: Every evening | ORAL | 1 refills | Status: DC
Start: 2021-02-17 — End: 2021-05-31

## 2021-02-17 MED ORDER — BUPRENORPHINE 8 MG-NALOXONE 2 MG SUBLINGUAL TABLET
2.0000 | SUBLINGUAL_TABLET | Freq: Every day | SUBLINGUAL | 0 refills | Status: DC
Start: 2021-02-17 — End: 2021-02-24

## 2021-02-17 MED ORDER — SERTRALINE 50 MG TABLET
50.0000 mg | ORAL_TABLET | Freq: Every day | ORAL | 1 refills | Status: DC
Start: 2021-02-17 — End: 2021-05-31

## 2021-02-17 NOTE — Progress Notes (Signed)
BEHAVIORAL MEDICINE, [facility]   [address]  [place] [address]     Outpatient COAT Progress Note    Name: Ellen Dillon MRN:  [id number]   Date: 02/17/2021 Age: 40 y.o.         Ellen Dillon was seen in the COAT Clinic. This clinic visit included medication management, addiction education, and evaluation of progress in recovery.      Subjective:     Intake: Prescribed opioids at age 53 with Lorcets 10's and then went to Percocet 5's, and then one day at age 28 her doc said he wasn't going to prescribe them any more.  Bought them off the street for a long time(mother helped her buy them).  Then became pregnant and wasn't using during that time.  Ended up having a miscarriage.  Went with [facility] for 6 years, but go discharged for missing a random.  Went to [facility] for a month, but didn't like the group and the doc, and the tele visit.  Allergic to the films.  Tablets do fine for her.  Left there in September 2021, and then went to bridge program and went to [place] from Oct until 2 weeks ago.  She states that  They told her that another patient had told them she had offered them drugs, which she states she hasn't.  Raped at age 23, and states that she doesn't think about it.  Used to get counseling for that, but no longer needs it.     1/27: Patient has 7 days sober. Denies c, wd, se.  Did not go to any meetings.  Doing well.  No new concerns.     2/3: Patient has 14 days sober.  Denies c, wd, se. 2 podcasts and no meetings. No concerns.     2/10: Patient has 2 days sober.   Denies c, wd, se.  Went to [facility] [employer] [facility].  She denied use last week, but when she came to screen she admitted to staff that she had used.  Stated she wasn't comfortable revealing this to the group.  Will explicitly states that she needs to call and let us know before group or during group of any relapses.      2/17: Patient has 2 days sober, smoked marijuana.  Denies c, wd, se.  Went to 2  meetings.  Had a bad week.     2/24: Patient has 7 days sober, but then revealed that she had a couple [employer] Lemonades yesterday.  She states she was unaware that she wasn't allowed to drink.  We updated her on our policies.  We were also able to confirm that she did call us last week to reschedule her individual therapy appointment.  She was given the wrong number, and ultimately we did not get the message.  We updated her with the correct number.     Current MAT dose: 16 mg Daily    Objective:  Video Visit.  Patient is in no acute distress, alert and oriented, and dressed appropriately.  Speech is clear and coherent.  Thought process is linear.  No indication of responding to internal stimuli.      Urine drug screen review:  Intake: 1/20: Bup, Coc, EtOH  2/3: Bup, Coc, EtOH  2/11: Bup, Coc, EtOH    BOP Review Date: 01/13/21    Lab Review:   Hepatitis A Neg  Hepatitis B Neg  Hepatitis C Neg  HIV  Neg  EtOH None  Detected  LFTs Unremarkable      Ellen Dillon was seen today for addiction problem.    Diagnoses and all orders for this visit:    Opioid use disorder, severe, in early remission, on maintenance therapy (CMS HCC)  -     Discontinue: buprenorphine-naloxone (SUBOXONE) 8-2 mg Sublingual Tablet, Sublingual; Place 2 Tablets under the tongue Once a day for 1 day  -     buprenorphine-naloxone (SUBOXONE) 8-2 mg Sublingual Tablet, Sublingual; Place 2 Tablets under the tongue Once a day    Anxiety  -     sertraline (ZOLOFT) 50 mg Oral Tablet; Take 1 Tablet (50 mg total) by mouth Once a day Indications: repeated episodes of anxiety  -     traZODone (DESYREL) 50 mg Oral Tablet; Take 1 Tablet (50 mg total) by mouth Every night        Continue current dose of Suboxone and RTO in 1 week for COAT.        Arlyn Dunning, MD  02/17/2021, 13:54        TELEMEDICINE DOCUMENTATION:    Patient Location:  MyChart video visit from home address: [address]  [facility] [address]    Patient/family aware of provider location:   yes  Patient/family consent for telemedicine:  yes  Examination observed and performed by:  Arlyn Dunning, MD

## 2021-02-17 NOTE — Progress Notes (Signed)
[facility] BEHAVIORAL MEDICINE AND PSYCHIATRY  OPERATED BY [facility] MEDICINE AND [facility] PHYSICIANS    [facility] - [address]  [address]  [place], [place] [address]  [phone]      Group Therapy Progress Note      TELEMEDICINE DOCUMENTATION:    Patient Location:  MyChart video visit from home address: [address]  [facility] [address]    Patient/family aware of provider location:  yes  Patient/family consent for telemedicine:  yes  Examination observed and performed by:  Ellen Dillon, LPC        Ellen Dillon  Date of Service:  02/17/2021  TIME:  1330-1430  DURATION:  60 min *This patient had numerous technological difficulties resulting in sporadic attendance throughout the group.  NUMBER IN GROUP:  5          CHIEF COMPLAINT:  Group Therapy CPT [phone] Addiction Problem on COAT Maintenance Program.  *Dr. Harvie Bridge is the Supervising Physician for the Group.      Subjective:  Ellen Dillon is seen for virtual group therapy. She/He is participating in a personal recovery program, which includes attending this Comprehensive Opioid Addiction Treatment Program.  Ellen Dillon reports no relapses since last group session.  Ellen Dillon reports attending 2 support recovery meetings for the week.   Ellen Dillon reports maintaining recovery and being compliant with medications.     Group began with individual check ins and introductions as needed.    Group Topic- Three G's to Keep You Going.  Explored the role of Gratitude, Guidance, and Grit in furthering/sustaining recovery.  Specifically, group members identified what they were grateful for in their lives, the support systems they have in place, and the actions they are taking to maintain recovery.  Group discussion was centered around relapse prevention, building stronger support networks, and ways to expand recovery skills.    Homework Assigned- N/A      Observation:  Mood: within normal limits  Affect: stable      Thought process: within normal  limits  Thought Content:  Within normal limits  Interpersonal:  Discussed issues and Displayed insight  Participation: Partial due to connectivity issues      Assessment:     ICD-10-CM    1. 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 This is a weekly process group designed to be appropriate for individuals with a substance use disorder.  The focus of this group was to provide supportive therapy, establish mutual support, and provide psycho-education for the biological aspects of addiction, recovery, and the efficacy of  the development of a recovery plan.   Encouragement of attendance of recovery meetings, identification of triggers for use/relapse and the development of a supportive social network were also covered.  Therapist utilized Motivational Interviewing, Cognitive Behavioral Therapy and person centered strategies to provide evidence based and supportive therapy. Therapist facilitated group support and discussion to include relapse prevention, building a sound recovery program, and highlight the practice of new recovery tools.       Treatment Plan:    Abstinence    Medication compliance   Increase effective coping skills   Decrease stress   Participate in weekly group therapy   Participate in monthly OP individual therapy   Open communication and honesty    Follow recommendations of treatment         Ellen Dillon, LPC  02/17/2021, 14:54

## 2021-02-17 NOTE — Progress Notes (Signed)
Pt came in for urine drug screen. Pt admits to COCO, THC use. Pt positive for COCO, THC and BUP. Pt observed. Will send out for confirmation and levels.    Crystal Everson, [place]  02/17/2021, 15:10    Patient did not admit to myself or case management that she had used cocaine. She admitted to a drink yesterday, but stated that she has not used anything else in the past 7 days.  Will need to address this with the patient tomorrow.     Arlyn Dunning, MD  02/17/2021, 16:35

## 2021-02-18 ENCOUNTER — Telehealth (INDEPENDENT_AMBULATORY_CARE_PROVIDER_SITE_OTHER): Payer: Self-pay

## 2021-02-18 NOTE — Telephone Encounter (Signed)
Patient contacted the office regarding her recent uds. Patient stated that the last time she used was 7/8 days ago. Patient was informed that we would wait for the confirmation to return with levels and that we would go from there.

## 2021-02-23 ENCOUNTER — Telehealth (INDEPENDENT_AMBULATORY_CARE_PROVIDER_SITE_OTHER): Payer: Self-pay

## 2021-02-23 NOTE — Telephone Encounter (Signed)
Contacted patient for a random urine drug screen for 3/3. Patient was instructed to bring her prescription with her. Patient confirmed.

## 2021-02-24 ENCOUNTER — Encounter (INDEPENDENT_AMBULATORY_CARE_PROVIDER_SITE_OTHER): Payer: Self-pay | Admitting: FAMILY PRACTICE

## 2021-02-24 ENCOUNTER — Encounter (INDEPENDENT_AMBULATORY_CARE_PROVIDER_SITE_OTHER): Payer: Self-pay

## 2021-02-24 ENCOUNTER — Telehealth: Payer: Medicaid Other | Admitting: FAMILY PRACTICE

## 2021-02-24 ENCOUNTER — Other Ambulatory Visit: Payer: Self-pay

## 2021-02-24 ENCOUNTER — Ambulatory Visit (INDEPENDENT_AMBULATORY_CARE_PROVIDER_SITE_OTHER): Payer: Medicaid Other

## 2021-02-24 ENCOUNTER — Telehealth: Payer: Medicaid Other | Admitting: Counselor - Mental Health Counselor

## 2021-02-24 DIAGNOSIS — F1190 Opioid use, unspecified, uncomplicated: Secondary | ICD-10-CM

## 2021-02-24 DIAGNOSIS — Z79899 Other long term (current) drug therapy: Secondary | ICD-10-CM

## 2021-02-24 DIAGNOSIS — F1121 Opioid dependence, in remission: Secondary | ICD-10-CM

## 2021-02-24 DIAGNOSIS — F1120 Opioid dependence, uncomplicated: Secondary | ICD-10-CM

## 2021-02-24 MED ORDER — BUPRENORPHINE 8 MG-NALOXONE 2 MG SUBLINGUAL TABLET
2.0000 | SUBLINGUAL_TABLET | Freq: Every day | SUBLINGUAL | 0 refills | Status: DC
Start: 2021-02-24 — End: 2021-03-03

## 2021-02-24 NOTE — Progress Notes (Signed)
BEHAVIORAL MEDICINE, [facility]   [address]  [place] [address]     Outpatient COAT Progress Note    Name: Ellen Dillon MRN:  [id number]   Date: 02/24/2021 Age: 40 y.o.         Ellen Dillon was seen in the COAT Clinic. This clinic visit included medication management, addiction education, and evaluation of progress in recovery.      Subjective:     Intake: Prescribed opioids at age 25 with Lorcets 10's and then went to Percocet 5's, and then one day at age 7 her doc said he wasn't going to prescribe them any more.  Bought them off the street for a long time(mother helped her buy them).  Then became pregnant and wasn't using during that time.  Ended up having a miscarriage.  Went with [facility] for 6 years, but go discharged for missing a random.  Went to [facility] for a month, but didn't like the group and the doc, and the tele visit.  Allergic to the films.  Tablets do fine for her.  Left there in September 2021, and then went to bridge program and went to [place] from Oct until 2 weeks ago.  She states that  They told her that another patient had told them she had offered them drugs, which she states she hasn't.  Raped at age 73, and states that she doesn't think about it.  Used to get counseling for that, but no longer needs it.     1/27: Patient has 7 days sober. Denies c, wd, se.  Did not go to any meetings.  Doing well.  No new concerns.     2/3: Patient has 14 days sober.  Denies c, wd, se. 2 podcasts and no meetings. No concerns.     2/10: Patient has 2 days sober.   Denies c, wd, se.  Went to [employer] [facility].  She denied use last week, but when she came to screen she admitted to staff that she had used.  Stated she wasn't comfortable revealing this to the group.  Will explicitly states that she needs to call and let us know before group or during group of any relapses.      2/17: Patient has 2 days sober, smoked marijuana.  Denies c, wd, se.  Went to 2 meetings.   Had a bad week.     2/24: Patient has 7 days sober, but then revealed that she had a couple [employer] Lemonades yesterday.  She states she was unaware that she wasn't allowed to drink.  We updated her on our policies.  We were also able to confirm that she did call us last week to reschedule her individual therapy appointment.  She was given the wrong number, and ultimately we did not get the message.  We updated her with the correct number.     3/3: Patient has 1 day sober.  Used cocaine yesterday.  Denies c, wd, se. Will need a letter for the judge.     Current MAT dose: 16 mg Daily    Objective:  Video Visit.  Patient is in no acute distress, alert and oriented, and dressed appropriately.  Speech is clear and coherent.  Thought process is linear.  No indication of responding to internal stimuli.      Urine drug screen review:  Intake: 1/20: Bup, Coc, EtOH  2/3: Bup, Coc, EtOH  2/11: Bup, Coc, EtOH      BOP  Review Date: 01/13/21    Lab Review:   Hepatitis A Neg  Hepatitis B Neg  Hepatitis C Neg  HIV  Neg  EtOH None Detected  LFTs Unremarkable      Diagnoses and all orders for this visit:    Opioid use disorder, severe, in early remission, on maintenance therapy (CMS HCC)  -     buprenorphine-naloxone (SUBOXONE) 8-2 mg Sublingual Tablet, Sublingual; Place 2 Tablets under the tongue Once a day        Continue current dose of Suboxone and RTO in 1 week for COAT.        Arlyn Dunning, MD  02/24/2021, 14:01          TELEMEDICINE DOCUMENTATION:    Patient Location:  MyChart video visit from home address: [address]  [place] [address]    Patient/family aware of provider location:  yes  Patient/family consent for telemedicine:  yes  Examination observed and performed by:  Arlyn Dunning, MD

## 2021-02-24 NOTE — Nursing Note (Signed)
Patient came in for urine drug screen.  Patient admits to [facility], and BUP use.  Patient positive for COC, THC, AND BUP.  Patient observed.  Will send out levels for confirmation.     [address] [place], [place]  02/24/2021, 14:11

## 2021-02-25 ENCOUNTER — Ambulatory Visit (INDEPENDENT_AMBULATORY_CARE_PROVIDER_SITE_OTHER): Payer: Medicaid Other | Admitting: Counselor - Mental Health Counselor

## 2021-02-25 DIAGNOSIS — F1120 Opioid dependence, uncomplicated: Secondary | ICD-10-CM

## 2021-02-25 NOTE — Progress Notes (Signed)
[place] BEHAVIORAL MEDICINE AND PSYCHIATRY  OPERATED BY [facility] MEDICINE AND [facility] PHYSICIANS    [facility] - [address]  [address]  [place], [place] [address]  [phone]      Group Therapy Progress Note      TELEMEDICINE DOCUMENTATION:    Patient Location:  MyChart video visit from home address: [address] [place], [place] [address]     Patient/family aware of provider location:  yes  Patient/family consent for telemedicine:  yes  Examination observed and performed by:   Kristeen Miss  Date of Service:  02/24/2021  TIME:  1330-1400  DURATION:  30 min- Had to leave group early  NUMBER IN GROUP:  8          CHIEF COMPLAINT:  Group Therapy CPT [phone] Addiction Problem on COAT Maintenance Program.        Subjective:  Ellen Dillon is seen for virtual group therapy and is participating in a personal recovery program, which includes attending this Comprehensive Opioid Addiction Treatment Program.  Ellen Dillon reported having a relapse prior to the start of group and did not feel comfortable sharing this openly at this time.  However, she did acknowledge an accurate sober day count in group.  Ellen Dillon reports attending five support recovery meetings for the week.   Ellen Dillon reports being compliant with medications.     Group began with individual check ins and introductions as needed.    Group Topic- Anxiety.  The group discussed the physical, cognitive, and behavioral symptoms of anxiety, and what they experience personally.  Psycho education provided regarding management of negative anxiety symptoms and how to effectively cope with anxiety in a healthy way. The group discussed how their substance use has played a role in their issues with anxiety.      Observation:  Mood: within normal limits  Affect: consistent with mood      Thought process: within normal limits  Thought Content:  Within normal limits  Interpersonal:  Discussed issues, Attentive, Displayed insight and  Provided feedback  Participation: Full  Appearance: neatly dressed      Assessment:     ICD-10-CM    1. 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 This is a weekly process group designed to be appropriate for individuals with a substance use disorder.  The focus of this group was to provide supportive therapy, establish mutual support, and provide psycho-education for the biological aspects of addiction, recovery, and the efficacy of  the development of a recovery plan.   Encouragement of attendance of recovery meetings, identification of triggers for use/relapse and the development of a supportive social network were also covered.  Therapist utilized Motivational Interviewing, Cognitive Behavioral Therapy and person centered strategies to provide evidence based and supportive therapy. Therapist facilitated group support and discussion to include relapse prevention, building a sound recovery program, and highlight the practice of new recovery tools.       Treatment Plan:   Abstinence   Medication compliance  Increase effective coping skills  Decrease stress  Participate in weekly group therapy  Participate in monthly OP individual therapy  Open communication and honesty   Follow recommendations of treatment         Anda Kraft, STUDENT SOCIAL WORKER  02/25/2021, 11:51    *I was present for the  duration of this service and was the main therapist involved in the session.  I have reviewed the student intern's note. I agree with the findings and plan of care as documented in the student intern's note.  Any exceptions/additions are edited/noted.      Rolm Gala. Elzabeth Mcquerry, M.S.Ed, NCC, CCTP, LPC, ALPS  Microbiologist # [phone]  [facility] Approved Licensed Professional Supervisor  MD Licensed Financial controller # [phone]  [facility]: COAT Program

## 2021-03-03 ENCOUNTER — Encounter (INDEPENDENT_AMBULATORY_CARE_PROVIDER_SITE_OTHER): Payer: Self-pay | Admitting: FAMILY PRACTICE

## 2021-03-03 ENCOUNTER — Telehealth: Payer: Medicaid Other | Admitting: FAMILY PRACTICE

## 2021-03-03 ENCOUNTER — Telehealth: Payer: Medicaid Other | Admitting: Counselor - Mental Health Counselor

## 2021-03-03 DIAGNOSIS — F1120 Opioid dependence, uncomplicated: Secondary | ICD-10-CM

## 2021-03-03 DIAGNOSIS — F1121 Opioid dependence, in remission: Secondary | ICD-10-CM

## 2021-03-03 DIAGNOSIS — F419 Anxiety disorder, unspecified: Secondary | ICD-10-CM

## 2021-03-03 DIAGNOSIS — Z79899 Other long term (current) drug therapy: Secondary | ICD-10-CM

## 2021-03-03 MED ORDER — HYDROXYZINE HCL 25 MG TABLET
25.0000 mg | ORAL_TABLET | Freq: Three times a day (TID) | ORAL | 0 refills | Status: DC | PRN
Start: 2021-03-03 — End: 2021-04-28

## 2021-03-03 MED ORDER — BUPRENORPHINE 8 MG-NALOXONE 2 MG SUBLINGUAL TABLET
2.0000 | SUBLINGUAL_TABLET | Freq: Every day | SUBLINGUAL | 0 refills | Status: DC
Start: 2021-03-03 — End: 2021-03-10

## 2021-03-03 NOTE — Progress Notes (Signed)
BEHAVIORAL MEDICINE, [facility]   [address]  [place] [address]     Outpatient COAT Progress Note    Name: Ellen Dillon MRN:  [id number]   Date: 03/03/2021 Age: 40 y.o.         Ellen Dillon was seen in the COAT Clinic. This clinic visit included medication management, addiction education, and evaluation of progress in recovery.      Subjective:     Intake: Prescribed opioids at age 39 with Lorcets 10's and then went to Percocet 5's, and then one day at age 53 her doc said he wasn't going to prescribe them any more.  Bought them off the street for a long time(mother helped her buy them).  Then became pregnant and wasn't using during that time.  Ended up having a miscarriage.  Went with [facility] for 6 years, but go discharged for missing a random.  Went to [facility] for a month, but didn't like the group and the doc, and the tele visit.  Allergic to the films.  Tablets do fine for her.  Left there in September 2021, and then went to bridge program and went to [place] from Oct until 2 weeks ago.  She states that  They told her that another patient had told them she had offered them drugs, which she states she hasn't.  Raped at age 64, and states that she doesn't think about it.  Used to get counseling for that, but no longer needs it.     1/27: Patient has 7 days sober. Denies c, wd, se.  Did not go to any meetings.  Doing well.  No new concerns.     2/3: Patient has 14 days sober.  Denies c, wd, se. 2 podcasts and no meetings. No concerns.     2/10: Patient has 2 days sober.   Denies c, wd, se.  Went to [facility].  She denied use last week, but when she came to screen she admitted to staff that she had used.  Stated she wasn't comfortable revealing this to the group.  Will explicitly states that she needs to call and let us know before group or during group of any relapses.      2/17: Patient has 2 days sober, smoked marijuana.  Denies c, wd, se.  Went to 2  meetings.  Had a bad week.     2/24: Patient has 7 days sober, but then revealed that she had a couple [employer] Hard Lemonades yesterday.  She states she was unaware that she wasn't allowed to drink.  We updated her on our policies.  We were also able to confirm that she did call us last week to reschedule her individual therapy appointment.  She was given the wrong number, and ultimately we did not get the message.  We updated her with the correct number.     3/3: Patient has 1 day sober.  Used cocaine yesterday.  Denies c, wd, se.     3/10: States she has had 4 days sober and used alcohol only.  Went to [facility] and is 8 behind.  Denies c, wd, se.  Requesting to start hydroxyzine.      Current MAT dose: 16 mg Daily    Objective:  Video Visit.  Patient is in no acute distress, alert and oriented, and dressed appropriately.  Speech is clear and coherent.  Thought process is linear.  No indication of responding to internal stimuli.  Urine drug screen review:  Intake: 1/20: Bup, Coc, EtOH  2/3: Bup, Coc, EtOH  2/11: Bup, Coc, EtOH  2/24: Bup, Coc, EtOH  3/3: Bup, Coc, EtOH    BOP Review Date: 01/13/21    Lab Review:   Hepatitis A Neg  Hepatitis B Neg  Hepatitis C Neg  HIV  Neg  EtOH None Detected  LFTs Unremarkable      Diagnoses and all orders for this visit:    Anxiety  -     hydrOXYzine HCL (ATARAX) 25 mg Oral Tablet; Take 1 Tablet (25 mg total) by mouth Every 8 hours as needed for Anxiety    Opioid use disorder, severe, in early remission, on maintenance therapy (CMS HCC)  -     buprenorphine-naloxone (SUBOXONE) 8-2 mg Sublingual Tablet, Sublingual; Place 2 Tablets under the tongue Once a day    Reviewed interactions and since there are none, will start hydroxyzine.     Continue current dose of Suboxone and RTO in 1 week for COAT.      Arlyn Dunning, MD  03/03/2021, 14:00      TELEMEDICINE DOCUMENTATION:    Patient Location:  MyChart video visit from home address: [address]  [place] [address]     Patient/family aware of provider location:  yes  Patient/family consent for telemedicine:  yes  Examination observed and performed by:  Arlyn Dunning, MD

## 2021-03-03 NOTE — Progress Notes (Addendum)
[facility]IAT[facility]  OPERATED BY [facility] MEDICINE AND [facility] PHYSICIANS    [facility] - [address]  [address]  [place], [place] [address]  [phone]      Group Therapy Progress Note      TELEMEDICINE DOCUMENTATION:    Patient Location:  MyChart video visit from [address] [place], [place] [address]     Patient/family aware of provider location:  yes  Patient/family consent for telemedicine:  yes  Examination observed and performed by:   Kristeen Miss  Date of Service:  03/03/2021  TIME:  1330-1430  DURATION:  60 min  NUMBER IN GROUP:  9          CHIEF COMPLAINT:  Group Therapy CPT 30076 Addiction Problem on COAT Maintenance Program.      Subjective:  Ellen Dillon is seen for virtual group therapy and is participating in a personal recovery program, which includes attending this Comprehensive Opioid Addiction Treatment Program.  Ellen Dillon reports no relapses since last group session.  Ellen Dillon reports attending three support recovery meetings for the week.   Ellen Dillon reports maintaining recovery and being compliant with medications.     Group began with individual check ins and introductions as needed.    Review of Last week's Group Topic- Anxiety    Group Topic- Anger.  The group discussed how anger impacts them and the others around them.  The group talked about what emotions are behind the anger they experience.  The group identified the ways they are currently responding to their anger and what they could do differently to address their anger in healthier ways.  Psychoeducation provided on healthy responses to anger.  Group focused on improving emotion regulation strategies for relapse prevention.    Homework Assigned- Practice healthy ways controlling anger      Observation:  Mood: within normal limits  Affect: consistent with mood      Thought process: within normal limits  Thought Content:  Within normal limits  Interpersonal:  Discussed  issues, Attentive, Displayed insight and Provided feedback  Participation: Full  Appearance: neatly dressed, dressed appropriately and well groomed      Assessment:     ICD-10-CM    1. Opioid use disorder, severe, on maintenance therapy, dependence (CMS HCC)  F11.20          Procedure:  22633 -  Patient attended COAT Group. This is a weekly process group designed to be appropriate for individuals with a substance use disorder.  The focus of this group was to provide supportive therapy, establish mutual support, and provide psycho-education for the biological aspects of addiction, recovery, and the efficacy of  the development of a recovery plan.   Encouragement of attendance of recovery meetings, identification of triggers for use/relapse and the development of a supportive social network were also covered.  Therapist utilized Motivational Interviewing, Cognitive Behavioral Therapy and person centered strategies to provide evidence based and supportive therapy. Therapist facilitated group support and discussion to include relapse prevention, building a sound recovery program, and highlight the practice of new recovery tools.       Treatment Plan:   Abstinence   Medication compliance  Increase effective coping skills  Decrease stress  Participate in weekly group therapy  Participate in monthly OP individual therapy  Open communication and honesty   Follow recommendations of treatment         Anda Kraft, STUDENT SOCIAL WORKER  03/03/2021, 15:07      *I was present for the duration of this service and was the main therapist involved in the session.  I have reviewed the student intern's note. I agree with the findings and plan of care as documented in the student intern's note.  Any exceptions/additions are edited/noted.    Rolm Gala. Nicolaus Andel, M.S.Ed, NCC, CCTP, LPC, ALPS  Microbiologist # [phone]  [facility] Approved Licensed Professional Supervisor  MD Licensed Financial controller #  [phone]  [facility]: COAT Program

## 2021-03-10 ENCOUNTER — Other Ambulatory Visit: Payer: Self-pay

## 2021-03-10 ENCOUNTER — Telehealth: Payer: Medicaid Other | Admitting: Counselor - Mental Health Counselor

## 2021-03-10 ENCOUNTER — Ambulatory Visit (INDEPENDENT_AMBULATORY_CARE_PROVIDER_SITE_OTHER): Payer: Medicaid Other

## 2021-03-10 ENCOUNTER — Telehealth: Payer: Medicaid Other | Admitting: FAMILY PRACTICE

## 2021-03-10 ENCOUNTER — Encounter (INDEPENDENT_AMBULATORY_CARE_PROVIDER_SITE_OTHER): Payer: Self-pay

## 2021-03-10 ENCOUNTER — Encounter (INDEPENDENT_AMBULATORY_CARE_PROVIDER_SITE_OTHER): Payer: Self-pay | Admitting: FAMILY PRACTICE

## 2021-03-10 DIAGNOSIS — Z79899 Other long term (current) drug therapy: Secondary | ICD-10-CM

## 2021-03-10 DIAGNOSIS — F1121 Opioid dependence, in remission: Secondary | ICD-10-CM

## 2021-03-10 DIAGNOSIS — F1190 Opioid use, unspecified, uncomplicated: Secondary | ICD-10-CM

## 2021-03-10 DIAGNOSIS — F1120 Opioid dependence, uncomplicated: Secondary | ICD-10-CM

## 2021-03-10 MED ORDER — BUPRENORPHINE 8 MG-NALOXONE 2 MG SUBLINGUAL TABLET
2.0000 | SUBLINGUAL_TABLET | Freq: Every day | SUBLINGUAL | 0 refills | Status: DC
Start: 2021-03-10 — End: 2021-03-17

## 2021-03-10 NOTE — Nursing Note (Signed)
Patient came in for urine drug screen today. Patient admits to COC use. Patient positive for COC and BUP. Patient was observed. Will send out for levels and confirmation.     Leoma Folds, MA  03/10/2021, 14:44

## 2021-03-10 NOTE — Progress Notes (Signed)
BEHAVIORAL MEDICINE, [facility]   [address]  [place] [address]     Outpatient COAT Progress Note    Name: Ellen Dillon MRN:  [id number]   Date: 03/10/2021 Age: 40 y.o.         Ellen Dillon was seen in the COAT Clinic. This clinic visit included medication management, addiction education, and evaluation of progress in recovery.      Subjective:     Intake: Prescribed opioids at age 73 with Lorcets 10's and then went to Percocet 5's, and then one day at age 56 her doc said he wasn't going to prescribe them any more.  Bought them off the street for a long time(mother helped her buy them).  Then became pregnant and wasn't using during that time.  Ended up having a miscarriage.  Went with [facility] for 6 years, but go discharged for missing a random.  Went to [facility] for a month, but didn't like the group and the doc, and the tele visit.  Allergic to the films.  Tablets do fine for her.  Left there in September 2021, and then went to bridge program and went to [place] from Oct until 2 weeks ago.  She states that  They told her that another patient had told them she had offered them drugs, which she states she hasn't.  Raped at age 67, and states that she doesn't think about it.  Used to get counseling for that, but no longer needs it.     1/27: Patient has 7 days sober. Denies c, wd, se.  Did not go to any meetings.  Doing well.  No new concerns.     2/3: Patient has 14 days sober.  Denies c, wd, se. 2 podcasts and no meetings. No concerns.     2/10: Patient has 2 days sober.   Denies c, wd, se.  Went to [facility].  She denied use last week, but when she came to screen she admitted to staff that she had used.  Stated she wasn't comfortable revealing this to the group.  Will explicitly states that she needs to call and let us know before group or during group of any relapses.      2/17: Patient has 2 days sober, smoked marijuana.  Denies c, wd, se.  Went to 2  meetings.  Had a bad week.     2/24: Patient has 7 days sober, but then revealed that she had a couple [employer] Hard Lemonades yesterday.  She states she was unaware that she wasn't allowed to drink.  We updated her on our policies.  We were also able to confirm that she did call us last week to reschedule her individual therapy appointment.  She was given the wrong number, and ultimately we did not get the message.  We updated her with the correct number.     3/3: Patient has 1 day sober.  Used cocaine yesterday.  Denies c, wd, se.     3/10: States she has had 4 days sober and used alcohol only.  Went to [facility] and is 8 behind.  Denies c, wd, se.  Requesting to start hydroxyzine.      3/17:  2 days sober, used cocaine and alcohol.  States she went on a binger because of her friend passing away from an overdose.  Went to 1.5 meetings.  Now 11 behind.  Denies wd, or se.  Denies cravings.  Would like to see  [facility] soon.     Current MAT dose: 16 mg Daily    Objective:  Video Visit.  Patient is in no acute distress, alert and oriented, and dressed appropriately.  Speech is clear and coherent.  Thought process is linear.  No indication of responding to internal stimuli.      Urine drug screen review:  Intake: 1/20: Bup, Coc, EtOH  2/3: Bup, Coc, EtOH  2/11: Bup, Coc, EtOH  2/24: Bup, Coc, EtOH  3/3: Bup, Coc, EtOH    BOP Review Date: 01/13/21    Lab Review:   Hepatitis A Neg  Hepatitis B Neg  Hepatitis C Neg  HIV  Neg  EtOH None Detected  LFTs Unremarkable      Diagnoses and all orders for this visit:    Opioid use disorder, severe, in early remission, on maintenance therapy (CMS HCC)  -     buprenorphine-naloxone (SUBOXONE) 8-2 mg Sublingual Tablet, Sublingual; Place 2 Tablets under the tongue Once a day        Continue current dose of Suboxone and RTO in 1 week for COAT.    Arlyn Dunning, MD  03/10/2021, 13:52      TELEMEDICINE DOCUMENTATION:    Patient Location:  MyChart video visit from home address: [address]  [facility] [address]    Patient/family aware of provider location:  yes  Patient/family consent for telemedicine:  yes  Examination observed and performed by:  Arlyn Dunning, MD

## 2021-03-11 ENCOUNTER — Ambulatory Visit (INDEPENDENT_AMBULATORY_CARE_PROVIDER_SITE_OTHER): Payer: Medicaid Other | Admitting: Counselor - Mental Health Counselor

## 2021-03-11 DIAGNOSIS — F1120 Opioid dependence, uncomplicated: Secondary | ICD-10-CM

## 2021-03-11 DIAGNOSIS — F1420 Cocaine dependence, uncomplicated: Secondary | ICD-10-CM

## 2021-03-11 NOTE — Progress Notes (Signed)
[facility]AV[facility]ICINE AND PSYCHIATRY  OPERATED BY [facility] MEDICINE AND [facility] PHYSICIANS    [facility] - [address]  [address]  [place], [place] [address]  [phone]      Group Therapy Progress Note      TELEMEDICINE DOCUMENTATION:    Patient Location:  MyChart video visit from [address], [place], [place] [address]     Patient/family aware of provider location:  yes  Patient/family consent for telemedicine:  yes  Examination observed and performed by:   Kristeen Miss  Date of Service:  03/10/2021  TIME:  1330-1430  DURATION:  60 min  NUMBER IN GROUP:  7        CHIEF COMPLAINT:  Group Therapy CPT 25366 Addiction Problem on COAT Maintenance Program.      Subjective:  Ellen Dillon is seen for virtual group therapy and is participating in a personal recovery program, which includes attending this Comprehensive Opioid Addiction Treatment Program.  Ellen Dillon reports relapsing on alcohol since last group session.  Ellen Dillon reports attending one and a half support recovery meetings for the week.   Ellen Dillon reports maintaining recovery and being compliant with medications.     Group began with individual check ins and introductions as needed.    Group Topic- Building resilience in recovery.  The group discussed characteristics of resiliency and what that meant to each of them.  The group brainstormed what it means to be brave, responsible, balanced, selfish, and thankful.  The group discussed the pros and cons of each and how each one can help them in their recovery.  Psychoeducation provided on how these characteristics can aid in their recovery.      Homework Assigned- At the end of each day, think about a few good things that went well/ positively in your day.    Observation:  Mood: within normal limits  Affect: stable      Thought process: within normal limits  Thought Content:  Within normal limits  Interpersonal:  Discussed issues, Attentive, Displayed insight and  Provided feedback  Participation: Full  Appearance: casually dressed, dressed appropriately and well groomed      Assessment:     ICD-10-CM    1. Opioid use disorder, severe, on maintenance therapy, dependence (CMS HCC)  F11.20          Procedure:  44034 -  Patient attended COAT Group. This is a weekly process group designed to be appropriate for individuals with a substance use disorder.  The focus of this group was to provide supportive therapy, establish mutual support, and provide psycho-education for the biological aspects of addiction, recovery, and the efficacy of  the development of a recovery plan.   Encouragement of attendance of recovery meetings, identification of triggers for use/relapse and the development of a supportive social network were also covered.  Therapist utilized Motivational Interviewing, Cognitive Behavioral Therapy and person centered strategies to provide evidence based and supportive therapy. Therapist facilitated group support and discussion to include relapse prevention, building a sound recovery program, and highlight the practice of new recovery tools.       Treatment Plan:   Abstinence   Medication compliance  Increase effective coping skills  Decrease stress  Participate in weekly group therapy  Participate in monthly OP individual therapy  Open communication and honesty   Follow recommendations of treatment         Anda Kraft, STUDENT SOCIAL WORKER  03/11/2021, 10:06      *  I was present for the duration of this service and was the main therapist involved in the session.  I have reviewed the student intern's note. I agree with the findings and plan of care as documented in the student intern's note.  Any exceptions/additions are edited/noted.    Rolm Gala. Yazan Gatling, M.S.Ed, NCC, CCTP, LPC, ALPS  Microbiologist # [phone]  [facility] Approved Licensed Professional Supervisor  MD Licensed Financial controller # [phone]  [facility]: COAT Program

## 2021-03-17 ENCOUNTER — Telehealth: Payer: Medicaid Other | Admitting: FAMILY PRACTICE

## 2021-03-17 ENCOUNTER — Encounter (INDEPENDENT_AMBULATORY_CARE_PROVIDER_SITE_OTHER): Payer: Self-pay | Admitting: FAMILY PRACTICE

## 2021-03-17 ENCOUNTER — Telehealth (INDEPENDENT_AMBULATORY_CARE_PROVIDER_SITE_OTHER): Payer: Self-pay

## 2021-03-17 ENCOUNTER — Telehealth: Payer: Medicaid Other | Admitting: Counselor - Mental Health Counselor

## 2021-03-17 ENCOUNTER — Encounter (INDEPENDENT_AMBULATORY_CARE_PROVIDER_SITE_OTHER): Payer: Self-pay

## 2021-03-17 DIAGNOSIS — F1121 Opioid dependence, in remission: Secondary | ICD-10-CM

## 2021-03-17 DIAGNOSIS — F1120 Opioid dependence, uncomplicated: Secondary | ICD-10-CM

## 2021-03-17 DIAGNOSIS — Z79899 Other long term (current) drug therapy: Secondary | ICD-10-CM

## 2021-03-17 MED ORDER — BUPRENORPHINE 8 MG-NALOXONE 2 MG SUBLINGUAL TABLET
2.0000 | SUBLINGUAL_TABLET | Freq: Every day | SUBLINGUAL | 0 refills | Status: DC
Start: 2021-03-17 — End: 2021-03-24

## 2021-03-17 NOTE — Progress Notes (Signed)
BEHAVIORAL MEDICINE, [facility]   [address]  [place] [address]     Outpatient COAT Progress Note    Name: Ellen Dillon MRN:  [id number]   Date: 03/17/2021 Age: 40 y.o.         Ellen Dillon was seen in the COAT Clinic. This clinic visit included medication management, addiction education, and evaluation of progress in recovery.      Subjective:     Intake: Prescribed opioids at age 78 with Lorcets 10's and then went to Percocet 5's, and then one day at age 43 her doc said he wasn't going to prescribe them any more.  Bought them off the street for a long time(mother helped her buy them).  Then became pregnant and wasn't using during that time.  Ended up having a miscarriage.  Went with [facility] for 6 years, but go discharged for missing a random.  Went to [facility] for a month, but didn't like the group and the doc, and the tele visit.  Allergic to the films.  Tablets do fine for her.  Left there in September 2021, and then went to bridge program and went to [place] from Oct until 2 weeks ago.  She states that  They told her that another patient had told them she had offered them drugs, which she states she hasn't.  Raped at age 77, and states that she doesn't think about it.  Used to get counseling for that, but no longer needs it.     1/27: Patient has 7 days sober. Denies c, wd, se.  Did not go to any meetings.  Doing well.  No new concerns.     2/3: Patient has 14 days sober.  Denies c, wd, se. 2 podcasts and no meetings. No concerns.     2/10: Patient has 2 days sober.   Denies c, wd, se.  Went to [facility].  She denied use last week, but when she came to screen she admitted to staff that she had used.  Stated she wasn't comfortable revealing this to the group.  Will explicitly states that she needs to call and let us know before group or during group of any relapses.      2/17: Patient has 2 days sober, smoked marijuana.  Denies c, wd, se.  Went to 2  meetings.  Had a bad week.     2/24: Patient has 7 days sober, but then revealed that she had a couple [employer] Hard Lemonades yesterday.  She states she was unaware that she wasn't allowed to drink.  We updated her on our policies.  We were also able to confirm that she did call us last week to reschedule her individual therapy appointment.  She was given the wrong number, and ultimately we did not get the message.  We updated her with the correct number.     3/3: Patient has 1 day sober.  Used cocaine yesterday.  Denies c, wd, se.     3/10: States she has had 4 days sober and used alcohol only.  Went to [facility] and is 8 behind.  Denies c, wd, se.  Requesting to start hydroxyzine.      3/17:  2 days sober, used cocaine and alcohol.  States she went on a binger because of her friend passing away from an overdose.  Went to 1.5 meetings.  Now 11 behind.  Denies wd, or se.  Denies cravings.  Would like to see  [facility] soon.     3/24: 4 days sober.  Used cocaine.  Went to [facility] and is 12 behind.  Denies c, wd, se.  Daughter and mother are both sick with the GI bug.     Current MAT dose: 16 mg Daily    Objective:  Video Visit.  Patient is in no acute distress, alert and oriented, and dressed appropriately.  Speech is clear and coherent.  Thought process is linear.  No indication of responding to internal stimuli.      Urine drug screen review:  Intake: 1/20: Bup, Coc, EtOH  2/3: Bup, Coc, EtOH  2/11: Bup, Coc, EtOH  2/24: Bup, Coc, EtOH  3/3: Bup, Coc, EtOH  3/17: Bup, Coc, Etoh    BOP Review Date: 01/13/21    Lab Review:   Hepatitis A Neg  Hepatitis B Neg  Hepatitis C Neg  HIV  Neg  EtOH None Detected  LFTs Unremarkable      Diagnoses and all orders for this visit:    Opioid use disorder, severe, in early remission, on maintenance therapy (CMS HCC)  -     buprenorphine-naloxone (SUBOXONE) 8-2 mg Sublingual Tablet, Sublingual; Place 2 Tablets under the tongue Once a day        Continue current dose of Suboxone and  RTO in 1 week for COAT.    Arlyn Dunning, MD  03/17/2021, 13:35      TELEMEDICINE DOCUMENTATION:    Patient Location:  MyChart video visit from home address: [address]  [facility] [address]    Patient/family aware of provider location:  yes  Patient/family consent for telemedicine:  yes  Examination observed and performed by:  Arlyn Dunning, MD

## 2021-03-17 NOTE — Telephone Encounter (Signed)
Returned patient's phone call regarding group. Patient was advised to participate in group virtually. Patient confirmed.

## 2021-03-17 NOTE — Progress Notes (Signed)
[facility] [place] BEHAVIORAL MEDICINE AND PSYCHIATRY  OPERATED BY [facility] MEDICINE AND [facility] PHYSICIANS    [facility] - [address]  [address]  [place], [place] [address]  [phone]      Group Therapy Progress Note      TELEMEDICINE DOCUMENTATION:    Patient Location:  MyChart video visit from home address: [address]  [facility] [address]    Patient/family aware of provider location:  yes  Patient/family consent for telemedicine:  yes  Examination observed and performed by:   Kristeen Miss  Date of Service:  03/17/2021  TIME:  1322-1430  DURATION:  68 min  NUMBER IN GROUP:  7          CHIEF COMPLAINT:  Group Therapy CPT 11941 Addiction Problem on COAT Maintenance Program.      Subjective:  Ellen Dillon is seen for virtual group therapy and is participating in a personal recovery program, which includes attending this Comprehensive Opioid Addiction Treatment Program.  Janny reports relapsing on cocaine and alcohol since last group session.  Mikiya reports attending three support recovery meetings for the week.   Katelee reports maintaining recovery and being compliant with medications.     Group began with individual check ins and introductions as needed.    Group Topic- Boundaries. Psychoeducation provided on what boundarires are, the benefits of setting boundaries, and how to assertively set boundaries.  The group explored physical, psychological, emotional, spiritual, and general boundaries and the risks/benefits of setting these boundaries.  The group discussed how not setting boundaries can have an impact on the individual's physical and mental health as well as sobriety.  Group members identified personal boundaries that they have as well as those they would like to set for themselves to better assist in achieving their goals.  The group identified how setting boundaries and allowing yourself to say no to others is important to one's recovery.       Homework  Assigned- Set boundaries for yourself.  This week practice saying no.      Observation:  Mood: within normal limits  Affect: consistent with mood      Thought process: within normal limits  Thought Content:  Within normal limits  Interpersonal:  Discussed issues, Attentive, Displayed insight and Provided feedback  Participation: Full  Appearance: casually dressed, dressed appropriately and well groomed      Assessment:     ICD-10-CM    1. Opioid use disorder, severe, on maintenance therapy, dependence (CMS HCC)  F11.20          Procedure:  74081 -  Patient attended COAT Group. This is a weekly process group designed to be appropriate for individuals with a substance use disorder.  The focus of this group was to provide supportive therapy, establish mutual support, and provide psycho-education for the biological aspects of addiction, recovery, and the efficacy of  the development of a recovery plan.   Encouragement of attendance of recovery meetings, identification of triggers for use/relapse and the development of a supportive social network were also covered.  Therapist utilized Motivational Interviewing, Cognitive Behavioral Therapy and person centered strategies to provide evidence based and supportive therapy. Therapist facilitated group support and discussion to include relapse prevention, building a sound recovery program, and highlight the practice of new recovery tools.       Treatment Plan:   Abstinence   Medication compliance  Increase effective coping skills  Decrease stress  Participate in weekly group therapy  Participate in monthly OP individual therapy  Open communication and honesty   Follow recommendations of treatment         Anda Kraft, STUDENT SOCIAL WORKER  03/17/2021, 16:31      *I was present for the duration of this service and was the main therapist involved in the session.  I have reviewed the student intern's note. I agree with the findings and plan of care as documented in the student  intern's note.  Any exceptions/additions are edited/noted.    Rolm Gala. Sixto Bowdish, M.S.Ed, NCC, CCTP, LPC, ALPS  Microbiologist # [phone]  [facility] Approved Licensed Professional Supervisor  MD Licensed Financial controller # [phone]  [facility]: COAT Program

## 2021-03-22 ENCOUNTER — Telehealth (INDEPENDENT_AMBULATORY_CARE_PROVIDER_SITE_OTHER): Payer: Self-pay

## 2021-03-22 ENCOUNTER — Encounter (INDEPENDENT_AMBULATORY_CARE_PROVIDER_SITE_OTHER): Payer: Self-pay

## 2021-03-22 NOTE — Telephone Encounter (Signed)
Attempted to contact patient to schedule April individual therapy. Unable to leave a voicemail due to mailbox not being setup. MyChart message will be sent.

## 2021-03-24 ENCOUNTER — Telehealth: Payer: Medicaid Other | Admitting: Counselor - Mental Health Counselor

## 2021-03-24 ENCOUNTER — Encounter (INDEPENDENT_AMBULATORY_CARE_PROVIDER_SITE_OTHER): Payer: Self-pay

## 2021-03-24 ENCOUNTER — Telehealth: Payer: Medicaid Other | Admitting: Psychiatry

## 2021-03-24 ENCOUNTER — Telehealth (INDEPENDENT_AMBULATORY_CARE_PROVIDER_SITE_OTHER): Payer: Self-pay

## 2021-03-24 DIAGNOSIS — F1120 Opioid dependence, uncomplicated: Secondary | ICD-10-CM

## 2021-03-24 DIAGNOSIS — Z79899 Other long term (current) drug therapy: Secondary | ICD-10-CM

## 2021-03-24 DIAGNOSIS — F1121 Opioid dependence, in remission: Secondary | ICD-10-CM

## 2021-03-24 MED ORDER — BUPRENORPHINE 8 MG-NALOXONE 2 MG SUBLINGUAL TABLET
2.0000 | SUBLINGUAL_TABLET | Freq: Every day | SUBLINGUAL | 0 refills | Status: DC
Start: 2021-03-24 — End: 2021-03-31

## 2021-03-24 NOTE — Progress Notes (Signed)
BEHAVIORAL MEDICINE, [facility]   [address]  [place] [address]     Outpatient COAT Progress Note    Name: Ellen Dillon MRN:  [id number]   Date: 03/24/2021 Age: 40 y.o.         Ellen Dillon was seen in the COAT Clinic. This clinic visit included medication management, addiction education, and evaluation of progress in recovery.      Subjective:     Intake: Prescribed opioids at age 12 with Lorcets 10's and then went to Percocet 5's, and then one day at age 39 her doc said he wasn't going to prescribe them any more.  Bought them off the street for a long time(mother helped her buy them).  Then became pregnant and wasn't using during that time.  Ended up having a miscarriage.  Went with [facility] for 6 years, but go discharged for missing a random.  Went to [facility] for a month, but didn't like the group and the doc, and the tele visit.  Allergic to the films.  Tablets do fine for her.  Left there in September 2021, and then went to bridge program and went to [place] from Oct until 2 weeks ago.  She states that  They told her that another patient had told them she had offered them drugs, which she states she hasn't.  Raped at age 55, and states that she doesn't think about it.  Used to get counseling for that, but no longer needs it.     1/27: Patient has 7 days sober. Denies c, wd, se.  Did not go to any meetings.  Doing well.  No new concerns.     2/3: Patient has 14 days sober.  Denies c, wd, se. 2 podcasts and no meetings. No concerns.     2/10: Patient has 2 days sober.   Denies c, wd, se.  Went to [employer].  She denied use last week, but when she came to screen she admitted to staff that she had used.  Stated she wasn't comfortable revealing this to the group.  Will explicitly states that she needs to call and let us know before group or during group of any relapses.      2/17: Patient has 2 days sober, smoked marijuana.  Denies c, wd, se.  Went to 2  meetings.  Had a bad week.     2/24: Patient has 7 days sober, but then revealed that she had a couple Mike[employer] Hard Lemonades yesterday.  She states she was unaware that she wasn't allowed to drink.  We updated her on our policies.  We were also able to confirm that she did call us last week to reschedule her individual therapy appointment.  She was given the wrong number, and ultimately we did not get the message.  We updated her with the correct number.     3/3: Patient has 1 day sober.  Used cocaine yesterday.  Denies c, wd, se.     3/10: States she has had 4 days sober and used alcohol only.  Went to [facility] and is 8 behind.  Denies c, wd, se.  Requesting to start hydroxyzine.      3/17:  2 days sober, used cocaine and alcohol.  States she went on a binger because of her friend passing away from an overdose.  Went to 1.5 meetings.  Now 11 behind.  Denies wd, or se.  Denies cravings.  Would like to see  Belinda soon.     3/24: 4 days sober.  Used cocaine.  Went to [employer] and is 12 behind.  Denies c, wd, se.  Daughter and mother are both sick with the GI bug.       03/24/21  11 days sober from opioids.  She stated that she has had alcohol last night.  She stated that she attended 3 meetings.  Currently denied any cravings for opioids.  Denied any side effects from Suboxone.    Current MAT dose: 16 mg Daily    Objective:  Video Visit.  Patient is in no acute distress, alert and oriented, and dressed appropriately.  Speech is clear and coherent.  Thought process is linear.  No indication of responding to internal stimuli.      Urine drug screen review:  Intake: 1/20: Bup, Coc, EtOH  2/3: Bup, Coc, EtOH  2/11: Bup, Coc, EtOH  2/24: Bup, Coc, EtOH  3/3: Bup, Coc, EtOH  3/17: Bup, Coc, Etoh    BOP Review Date: 01/13/21    Lab Review:   Hepatitis A Neg  Hepatitis B Neg  Hepatitis C Neg  HIV  Neg  EtOH None Detected  LFTs Unremarkable      There are no diagnoses linked to this encounter.    Continue current dose of  Suboxone and RTO in 1 week for COAT.    Susy Frizzle, MD  03/24/2021, 13:56        TELEMEDICINE DOCUMENTATION:    Patient Location:  MyChart video visit from home address: [address]  [place] [address]    Patient/family aware of provider location:  yes  Patient/family consent for telemedicine:  yes  Examination observed and performed by:  [employer],

## 2021-03-24 NOTE — Progress Notes (Signed)
[facility] BEHAVIORAL MEDICINE AND PSYCHIATRY  OPERATED BY [facility] MEDICINE AND [facility] PHYSICIANS    [facility] - [address]  [address]  [place], [place] [address]  [phone]      Group Therapy Progress Note      TELEMEDICINE DOCUMENTATION:    Patient Location:  MyChart video visit from home address: [address]  [facility] [place] [address]    Patient/family aware of provider location:  yes  Patient/family consent for telemedicine:  yes  Examination observed and performed by:   Kristeen Miss  Date of Service:  03/24/2021  TIME:  1300-1400  DURATION:  60 min  NUMBER IN GROUP:  7          CHIEF COMPLAINT:  Group Therapy CPT 58832 Addiction Problem on COAT Maintenance Program.      Subjective:  Shaaron is seen for virtual group therapy and is participating in a personal recovery program, which includes attending this Comprehensive Opioid Addiction Treatment Program.  Zainah reports relapsing on alcohol since last group session.  Kaedance reports attending three support recovery meetings for the week.   Seana reports maintaining recovery and being compliant with medications.     Group began with individual check ins and introductions as needed.    Review of Last week's Group Topic- Boundaries    Group Topic- Self sabotage.  The group discussed ways someone can sabotage themselves in life as well as their sobriety.  The group identified what they define self sabotage to be and what ways they have sabotaged themselves in the past. The group defined self sabotage as doing things not good for them, wreaking havoc in their mind, and not doing the things they need to get done.  The group discussed how they can stop their own self sabotage and identified following through with their recovery program, committing to not using, accepting things the way they are, and saying what's on their mind and not holding it in.    Homework Assigned- Group members identified one things they  would like to achieve in the next week. Legaci said this week she wants to take the steps to getting her driver's license.  She plans on reading the manual so she can take her test.      Observation:  Mood: within normal limits  Affect: stable      Thought process: within normal limits  Thought Content:  Within normal limits  Interpersonal:  Discussed issues, Attentive, Displayed insight and Provided feedback  Participation: Full  Appearance: casually dressed and dressed appropriately      Assessment:     ICD-10-CM    1. Opioid use disorder, severe, on maintenance therapy, dependence (CMS HCC)  F11.20          Procedure:  54982 -  Patient attended COAT Group. This is a weekly process group designed to be appropriate for individuals with a substance use disorder.  The focus of this group was to provide supportive therapy, establish mutual support, and provide psycho-education for the biological aspects of addiction, recovery, and the efficacy of  the development of a recovery plan.   Encouragement of attendance of recovery meetings, identification of triggers for use/relapse and the development of a supportive social network were also covered.  Therapist utilized Motivational Interviewing, Cognitive Behavioral Therapy and person centered strategies to provide evidence based and supportive therapy. Therapist facilitated group support and discussion to include relapse prevention, building a sound  recovery program, and highlight the practice of new recovery tools.       Treatment Plan:   Abstinence   Medication compliance  Increase effective coping skills  Decrease stress  Participate in weekly group therapy  Participate in monthly OP individual therapy  Open communication and honesty   Follow recommendations of treatment         Anda Kraft, STUDENT SOCIAL WORKER  03/24/2021, 14:58        *I was present for the duration of this service and was the main therapist involved in the session.  I have reviewed the student  intern's note. I agree with the findings and plan of care as documented in the student intern's note.  Any exceptions/additions are edited/noted.      Rolm Gala. Deitra Craine, M.S.Ed, NCC, CCTP, LPC, ALPS  Microbiologist # [phone]  [facility] Approved Licensed Professional Supervisor  MD Licensed Financial controller # [phone]  [facility]: COAT Program

## 2021-03-24 NOTE — Telephone Encounter (Signed)
Contacted patient for a random urine drug screen for 4.1.22. Patient stated she would come into the office in them morning. Patient advised to bring prescription with her. Patient confirmed.

## 2021-03-25 ENCOUNTER — Ambulatory Visit (INDEPENDENT_AMBULATORY_CARE_PROVIDER_SITE_OTHER): Payer: Self-pay

## 2021-03-29 ENCOUNTER — Telehealth (INDEPENDENT_AMBULATORY_CARE_PROVIDER_SITE_OTHER): Payer: Self-pay

## 2021-03-29 NOTE — Telephone Encounter (Signed)
Contacted patient to schedule individual therapy. Patient still requesting biweekly therapy. Patient stated that she struggles with transportation because her mother has other obligations. Patient was provided transportation resources. Scheduled for 4/8 at 4pm and 4/22 at 1pm. Patient then stated that Fridays are fine for transportation.

## 2021-03-30 ENCOUNTER — Telehealth (INDEPENDENT_AMBULATORY_CARE_PROVIDER_SITE_OTHER): Payer: Self-pay

## 2021-03-30 NOTE — Telephone Encounter (Signed)
Contacted patient for a random urine drug screen for 4/7. Patient expressed that she was already coming in for group and confirmed urine drug screen.

## 2021-03-31 ENCOUNTER — Encounter (INDEPENDENT_AMBULATORY_CARE_PROVIDER_SITE_OTHER): Payer: Self-pay

## 2021-03-31 ENCOUNTER — Other Ambulatory Visit: Payer: Self-pay

## 2021-03-31 ENCOUNTER — Telehealth: Payer: Medicaid Other | Admitting: FAMILY PRACTICE

## 2021-03-31 ENCOUNTER — Encounter (INDEPENDENT_AMBULATORY_CARE_PROVIDER_SITE_OTHER): Payer: Self-pay | Admitting: FAMILY PRACTICE

## 2021-03-31 ENCOUNTER — Ambulatory Visit (INDEPENDENT_AMBULATORY_CARE_PROVIDER_SITE_OTHER): Payer: Medicaid Other

## 2021-03-31 ENCOUNTER — Telehealth: Payer: Medicaid Other | Admitting: Counselor - Mental Health Counselor

## 2021-03-31 DIAGNOSIS — F1190 Opioid use, unspecified, uncomplicated: Secondary | ICD-10-CM

## 2021-03-31 DIAGNOSIS — F1121 Opioid dependence, in remission: Secondary | ICD-10-CM

## 2021-03-31 DIAGNOSIS — Z79899 Other long term (current) drug therapy: Secondary | ICD-10-CM

## 2021-03-31 DIAGNOSIS — F1120 Opioid dependence, uncomplicated: Secondary | ICD-10-CM

## 2021-03-31 MED ORDER — BUPRENORPHINE 8 MG-NALOXONE 2 MG SUBLINGUAL TABLET
2.0000 | SUBLINGUAL_TABLET | Freq: Every day | SUBLINGUAL | 0 refills | Status: DC
Start: 2021-03-31 — End: 2021-04-07

## 2021-03-31 NOTE — Nursing Note (Signed)
Patient came in for urine drug screen. Patient admits to cocaine use. Patient positive for BUP and COC. Patient was observed. Will send out for levels and confirmation.     Morghan Stotler, MA  03/31/2021, 14:28

## 2021-03-31 NOTE — Progress Notes (Signed)
BEHAVIORAL MEDICINE, [facility]   [address]  [place] [address]     Outpatient COAT Progress Note    Name: Ellen Dillon MRN:  [id number]   Date: 03/31/2021 Age: 40 y.o.         Ellen Dillon was seen in the COAT Clinic. This clinic visit included medication management, addiction education, and evaluation of progress in recovery.      Subjective:     Intake: Prescribed opioids at age 89 with Lorcets 10's and then went to Percocet 5's, and then one day at age 14 her doc said he wasn't going to prescribe them any more.  Bought them off the street for a long time(mother helped her buy them).  Then became pregnant and wasn't using during that time.  Ended up having a miscarriage.  Went with [facility] for 6 years, but go discharged for missing a random.  Went to [facility] for a month, but didn't like the group and the doc, and the tele visit.  Allergic to the films.  Tablets do fine for her.  Left there in September 2021, and then went to bridge program and went to [place] from Oct until 2 weeks ago.  She states that  They told her that another patient had told them she had offered them drugs, which she states she hasn't.  Raped at age 76, and states that she doesn't think about it.  Used to get counseling for that, but no longer needs it.     1/27: Patient has 7 days sober. Denies c, wd, se.  Did not go to any meetings.  Doing well.  No new concerns.     2/3: Patient has 14 days sober.  Denies c, wd, se. 2 podcasts and no meetings. No concerns.     2/10: Patient has 2 days sober.   Denies c, wd, se.  Went to [employer].  She denied use last week, but when she came to screen she admitted to staff that she had used.  Stated she wasn't comfortable revealing this to the group.  Will explicitly states that she needs to call and let us know before group or during group of any relapses.      2/17: Patient has 2 days sober, smoked marijuana.  Denies c, wd, se.  Went to 2 meetings.   Had a bad week.     2/24: Patient has 7 days sober, but then revealed that she had a couple [employer] Hard Lemonades yesterday.  She states she was unaware that she wasn't allowed to drink.  We updated her on our policies.  We were also able to confirm that she did call us last week to reschedule her individual therapy appointment.  She was given the wrong number, and ultimately we did not get the message.  We updated her with the correct number.     3/3: Patient has 1 day sober.  Used cocaine yesterday.  Denies c, wd, se.     3/10: States she has had 4 days sober and used alcohol only.  Went to [facility] and is 8 behind.  Denies c, wd, se.  Requesting to start hydroxyzine.      3/17:  2 days sober, used cocaine and alcohol.  States she went on a binger because of her friend passing away from an overdose.  Went to 1.5 meetings.  Now 11 behind.  Denies wd, or se.  Denies cravings.  Would like to see  Belinda soon.     3/24: 4 days sober.  Used cocaine.  Went to [employer] and is 12 behind.  Denies c, wd, se.  Daughter and mother are both sick with the GI bug.       3/31: 11 days sober from opioids.  She stated that she has had alcohol last night.  She stated that she attended 3 meetings.  Currently denied any cravings for opioids.  Denied any side effects from Suboxone.    4/7: 1 day sober, used cocaine.  Went to [employer] and is still 9 behind.  Denies any c, wd, se.  States she got a new job and might get overloaded, but it will get her out of her house and one step towards independence because it is right behind her house and so transportation isn't an issue.     Current MAT dose: 16 mg Daily    Objective:  Video Visit.  Patient is in no acute distress, alert and oriented, and dressed appropriately.  Speech is clear and coherent.  Thought process is linear.  No indication of responding to internal stimuli.      Urine drug screen review:  Intake: 1/20: Bup, Coc, EtOH  2/3: Bup, Coc, EtOH  2/11: Bup, Coc, EtOH  2/24:  Bup, Coc, EtOH  3/3: Bup, Coc, EtOH  3/17: Bup, Coc, Etoh    BOP Review Date: 03/31/21    Lab Review:   Hepatitis A Neg  Hepatitis B Neg  Hepatitis C Neg  HIV  Neg  EtOH None Detected  LFTs Unremarkable      Diagnoses and all orders for this visit:    Opioid use disorder, severe, in early remission, on maintenance therapy (CMS HCC)  -     buprenorphine-naloxone (SUBOXONE) 8-2 mg Sublingual Tablet, Sublingual; Place 2 Tablets under the tongue Once a day        Continue current dose of Suboxone and RTO in 1 week for COAT.    Arlyn Dunning, MD  03/31/2021, 13:47      TELEMEDICINE DOCUMENTATION:    Patient Location:  MyChart video visit from home address: [address]  [facility] [address]    Patient/family aware of provider location:  yes  Patient/family consent for telemedicine:  yes  Examination observed and performed by:  Arlyn Dunning, MD

## 2021-04-01 ENCOUNTER — Encounter (INDEPENDENT_AMBULATORY_CARE_PROVIDER_SITE_OTHER): Payer: Self-pay | Admitting: Counselor - Mental Health Counselor

## 2021-04-01 NOTE — Progress Notes (Signed)
[facility] BEHAVIORAL MEDICINE AND PSYCHIATRY  OPERATED BY [facility] MEDICINE AND [facility] PHYSICIANS    [facility] - [address]  [address]  [place], [place] [address]  [phone]      Group Therapy Progress Note      TELEMEDICINE DOCUMENTATION:    Patient Location:  MyChart video visit from home address: [address] [place], [place] [address]  Patient/family aware of provider location:  yes  Patient/family consent for telemedicine:  yes  Examination observed and performed by:   Kristeen Miss  Date of Service:  03/31/2021  TIME:  1330-1430   DURATION:  60 min  NUMBER IN GROUP:  7          CHIEF COMPLAINT:  Group Therapy CPT 49449 Addiction Problem on COAT Maintenance Program.      Subjective:  Ellen Dillon is seen for virtual group therapy and is participating in a personal recovery program, which includes attending this Comprehensive Opioid Addiction Treatment Program.  Graceland reports relapsing on cocaine since last group session.  [name] reports attending seven support recovery meetings for the week.   [name] reports maintaining opiate recovery and being compliant with medications.     Group began with individual check ins and introductions as needed.    Willpower and Powerlessness. The group defined what having willpower and being powerlessness is.  The group discussed the aspects of willpower and how each one applies to them and their recovery.  The group identified something they are powerless over.  [name] identified that she struggles in her relationship with her baby's father. She said no matter what she does she never feels like she is in control.  Zabria said she also struggled in addiction with making the right choices because she needed immediate gratification. The group identified something they would like to have more control over in the future.  Psychoeducation provided on taking control and eliminating powerlessness in situations in their  life.      Observation:  Mood: within normal limits  Affect: stable      Thought process: within normal limits  Thought Content:  Within normal limits  Interpersonal:  Discussed issues, Attentive, Displayed insight and Provided feedback  Participation: Full  Appearance: neatly dressed, dressed appropriately and well groomed      Assessment:     ICD-10-CM    1. Opioid use disorder, severe, on maintenance therapy, dependence (CMS HCC)  F11.20          Procedure:  67591 -  Patient attended COAT Group. This is a weekly process group designed to be appropriate for individuals with a substance use disorder.  The focus of this group was to provide supportive therapy, establish mutual support, and provide psycho-education for the biological aspects of addiction, recovery, and the efficacy of  the development of a recovery plan.   Encouragement of attendance of recovery meetings, identification of triggers for use/relapse and the development of a supportive social network were also covered.  Therapist utilized Motivational Interviewing, Cognitive Behavioral Therapy and person centered strategies to provide evidence based and supportive therapy. Therapist facilitated group support and discussion to include relapse prevention, building a sound recovery program, and highlight the practice of new recovery tools.       Treatment Plan:   Abstinence   Medication compliance  Increase effective coping skills  Decrease stress  Participate in weekly group therapy  Participate in monthly OP individual therapy  Open communication and  honesty   Follow recommendations of treatment         Anda Kraft, STUDENT SOCIAL WORKER  04/01/2021, 10:19      *I was present for the duration of this service and was the main therapist involved in the session.  I have reviewed the student intern's note. I agree with the findings and plan of care as documented in the student intern's note.  Any exceptions/additions are edited/noted.      Rolm Gala.  [place], M.S.Ed, NCC, CCTP, LPC, ALPS  Microbiologist # [phone]  [facility] Approved Licensed Professional Supervisor  MD Licensed Financial controller # [phone]  [facility]: COAT Program

## 2021-04-07 ENCOUNTER — Telehealth: Payer: Medicaid Other | Admitting: Counselor - Mental Health Counselor

## 2021-04-07 ENCOUNTER — Other Ambulatory Visit: Payer: Self-pay

## 2021-04-07 ENCOUNTER — Telehealth: Payer: Medicaid Other | Admitting: FAMILY PRACTICE

## 2021-04-07 ENCOUNTER — Encounter (INDEPENDENT_AMBULATORY_CARE_PROVIDER_SITE_OTHER): Payer: Self-pay | Admitting: FAMILY PRACTICE

## 2021-04-07 ENCOUNTER — Ambulatory Visit (INDEPENDENT_AMBULATORY_CARE_PROVIDER_SITE_OTHER): Payer: Medicaid Other

## 2021-04-07 DIAGNOSIS — F1121 Opioid dependence, in remission: Secondary | ICD-10-CM

## 2021-04-07 DIAGNOSIS — Z79899 Other long term (current) drug therapy: Secondary | ICD-10-CM

## 2021-04-07 DIAGNOSIS — F1120 Opioid dependence, uncomplicated: Secondary | ICD-10-CM

## 2021-04-07 MED ORDER — BUPRENORPHINE 8 MG-NALOXONE 2 MG SUBLINGUAL TABLET
2.0000 | SUBLINGUAL_TABLET | Freq: Every day | SUBLINGUAL | 0 refills | Status: DC
Start: 2021-04-07 — End: 2021-04-11

## 2021-04-07 NOTE — Progress Notes (Signed)
BEHAVIORAL MEDICINE, [facility]   [address]  [place] [address]     Outpatient COAT Progress Note    Name: Ellen Dillon MRN:  [id number]   Date: 04/07/2021 Age: 40 y.o.         Ellen Dillon was seen in the COAT Clinic. This clinic visit included medication management, addiction education, and evaluation of progress in recovery.      Subjective:     Intake: Prescribed opioids at age 48 with Lorcets 10's and then went to Percocet 5's, and then one day at age 62 her doc said he wasn't going to prescribe them any more.  Bought them off the street for a long time(mother helped her buy them).  Then became pregnant and wasn't using during that time.  Ended up having a miscarriage.  Went with [facility] for 6 years, but go discharged for missing a random.  Went to [facility] for a month, but didn't like the group and the doc, and the tele visit.  Allergic to the films.  Tablets do fine for her.  Left there in September 2021, and then went to bridge program and went to [facility] from Oct until 2 weeks ago.  She states that  They told her that another patient had told them she had offered them drugs, which she states she hasn't.  Raped at age 40, and states that she doesn't think about it.  Used to get counseling for that, but no longer needs it.     1/27: Patient has 7 days sober. Denies c, wd, se.  Did not go to any meetings.  Doing well.  No new concerns.     2/3: Patient has 14 days sober.  Denies c, wd, se. 2 podcasts and no meetings. No concerns.     2/10: Patient has 2 days sober.   Denies c, wd, se.  Went to [employer].  She denied use last week, but when she came to screen she admitted to staff that she had used.  Stated she wasn't comfortable revealing this to the group.  Will explicitly states that she needs to call and let us know before group or during group of any relapses.      2/17: Patient has 2 days sober, smoked marijuana.  Denies c, wd, se.  Went to 2  meetings.  Had a bad week.     2/24: Patient has 7 days sober, but then revealed that she had a couple Mike[employer] Hard Lemonades yesterday.  She states she was unaware that she wasn't allowed to drink.  We updated her on our policies.  We were also able to confirm that she did call us last week to reschedule her individual therapy appointment.  She was given the wrong number, and ultimately we did not get the message.  We updated her with the correct number.     3/3: Patient has 1 day sober.  Used cocaine yesterday.  Denies c, wd, se.     3/10: States she has had 4 days sober and used alcohol only.  Went to [facility] and is 8 behind.  Denies c, wd, se.  Requesting to start hydroxyzine.      3/17:  2 days sober, used cocaine and alcohol.  States she went on a binger because of her friend passing away from an overdose.  Went to 1.5 meetings.  Now 11 behind.  Denies wd, or se.  Denies cravings.  Would like to see  Belinda soon.     3/24: 4 days sober.  Used cocaine.  Went to [employer] and is 12 behind.  Denies c, wd, se.  Daughter and mother are both sick with the GI bug.       3/31: 11 days sober from opioids.  She stated that she has had alcohol last night.  She stated that she attended 3 meetings.  Currently denied any cravings for opioids.  Denied any side effects from Suboxone.    4/7: 1 day sober, used cocaine.  Went to [employer] and is still 9 behind.  Denies any c, wd, se.  States she got a new job and might get overloaded, but it will get her out of her house and one step towards independence because it is right behind her house and so transportation isn't an issue.     4/14: 7 days sober.  Went to 2 meetings, and is 11 behind.  Started her new job.  Denies c, wd, se.  New job is a lot, but a good thing. Patient NC/NS her random and an individual therapy.  Will be on intensive for the next two weeks. Discussed her continued use of cocaine and EtOH, and she was very dismissive.  She stated this program was for  opioids, not everything else.  Also stated you all don't give any meds for anything else.  I assured her there are no medications for other drug use.  She also stated she is 40 years old and if she wants a glass of wine, she should be allowed to have one.  She felt shew as targeted, because other people don't show up for group sometimes.  We assured her that people who NC/NS will have penalties, but we impose them privately and not within the entire group.  She has very poor insight, and is not taking accountability for herself.     Current MAT dose: 16 mg Daily    Objective:  Video Visit.  Patient is in no acute distress, alert and oriented, and dressed appropriately.  Speech is clear and coherent.  Thought process is linear.  No indication of responding to internal stimuli.      Urine drug screen review:  Intake: 1/20: Bup, Coc, EtOH  2/3: Bup, Coc, EtOH  2/11: Bup, Coc, EtOH  2/24: Bup, Coc, EtOH  3/3: Bup, Coc, EtOH  3/17: Bup, Coc, EtOH  4/7: Bup, Coc, EtOH    BOP Review Date: 03/31/21    Lab Review:   Hepatitis A Neg  Hepatitis B Neg  Hepatitis C Neg  HIV  Neg  EtOH None Detected  LFTs Unremarkable      Diagnoses and all orders for this visit:    Opioid use disorder, severe, in early remission, on maintenance therapy (CMS HCC)  -     buprenorphine-naloxone (SUBOXONE) 8-2 mg Sublingual Tablet, Sublingual; Place 2 Tablets under the tongue Once a day        Continue current dose of Suboxone and RTO in 1 week for COAT.    Arlyn Dunning, MD  04/07/2021, 14:03        TELEMEDICINE DOCUMENTATION:    Patient Location:  MyChart video visit from home address: [address]  [facility] [id number]    Patient/family aware of provider location:  yes  Patient/family consent for telemedicine:  yes  Examination observed and performed by:  Arlyn Dunning, MD

## 2021-04-07 NOTE — Nursing Note (Signed)
Patient came in for urine drug screen today. Patient admits to COC use. Patient was Positive for COC and BUP. Patient was observed. Will send out for levels and confirmation.     Grayce Sessions, MA  04/07/2021, 15:07

## 2021-04-07 NOTE — Progress Notes (Signed)
[facility]    [place]  [place] [address]  [place], [place] [address]  [phone]      Group Therapy Progress Note      TELEMEDICINE DOCUMENTATION:    Patient Location:  MyChart video visit from [place] aware of provider location:  yes  Patient/family consent for telemedicine:  yes  Examination observed and performed by:  Farrel Gobble, LPC        [name]  Date of Service:  04/07/2021  TIME:  1325-1430  DURATION:  65 min  NUMBER IN GROUP:  7          CHIEF COMPLAINT:  Group Therapy CPT 90853 Addiction Problem on COAT Maintenance Program.  *Dr. Eduard Roux is the Supervising Physician for the Group.      Subjective:  Mercedies is seen for virtual group therapy and is participating in a personal recovery program, which includes attending this [facility].  Malissie reports no relapses since last group session.  Jennika reports attending 2 support recovery meetings for the week.   Kaylanie reports maintaining recovery and being compliant with medications.     Group began with individual check ins and introductions as needed.  Group engaged in an icebreaker questions activity to build group cohesion and welcome a new member.    Group Topic- Facilitated process therapy group using Cognitive Behavioral Therapy (CBT), Solution Focused Brief Therapy (SFBT) and person centered strategies.  Group members explored presenting issues, treatment progress made thus far, and identified desired goals of continued treatment.  Utilized "GREAT DREAM Ten keys to happier living" guided discussion to explore happiness, recovery, and overall well being.  Identified how Giving, Relating, Exercising, Appreciating, Trying out, Direction, Resilience, Emotion, Acceptance, and Meaning can assist in distress tolerance, emotion regulation, recovery goals, and  increased life satisfaction.       Matty shared that she is appreciative of the help her mother provides and her daughter gives her life meaning.    Homework Assigned- Engage in one activity that was discussed in group to foster increased feelings of happiness.    Avonlea missed a scheduled individual session and a urine drug screen.  As a result she met with the team after group to discuss further.  She was encouraged to cease other drug use such as cocaine and marijuana as she has not had any totally clean drug screens since admission.  The possibility of residential treatment was offered to her, but she is not interested in that at this time.    Observation:  Mood: irritable and within normal limits  Affect: stable and consistent with mood      Thought process: concrete and within normal limits  Thought Content:  Within normal limits  Interpersonal:  Discussed issues, Attentive and Provided feedback  Participation: Full  Appearance: dressed appropriately      Assessment:     ICD-10-CM    1. Opioid use disorder, severe, on maintenance therapy, dependence (CMS HCC)  F11.20          Procedure:  19622 -  Patient attended COAT Group. This is a weekly process group designed to be appropriate for individuals with a substance use disorder.  The focus of this group was to provide supportive therapy, establish mutual support, and provide psycho-education for the biological aspects of addiction, recovery, and the efficacy of  the development of  a recovery plan.   Encouragement of attendance of recovery meetings, identification of triggers for use/relapse and the development of a supportive social network were also covered.  Therapist utilized Motivational Interviewing, Cognitive Behavioral Therapy and person centered strategies to provide evidence based and supportive therapy. Therapist facilitated group support and discussion to include relapse prevention, building a sound recovery program, and highlight the practice of  new recovery tools.       Treatment Plan:    Abstinence    Medication compliance   Increase effective coping skills   Decrease stress   Participate in weekly group therapy   Participate in monthly OP individual therapy   Open communication and honesty    Follow recommendations of treatment         Farrel Gobble, [place]  04/07/2021, 15:43

## 2021-04-08 ENCOUNTER — Encounter (INDEPENDENT_AMBULATORY_CARE_PROVIDER_SITE_OTHER): Payer: Self-pay | Admitting: Counselor - Mental Health Counselor

## 2021-04-08 ENCOUNTER — Ambulatory Visit (INDEPENDENT_AMBULATORY_CARE_PROVIDER_SITE_OTHER): Payer: Medicaid Other

## 2021-04-08 DIAGNOSIS — F1121 Opioid dependence, in remission: Secondary | ICD-10-CM

## 2021-04-08 NOTE — Progress Notes (Signed)
COAT Program  [address]  [place], [place]  [address]    04/08/2021  T/C to client pharmacy to call in script for 5 tablets Suboxone for client, per provider request.      Terrance Mass, [facility]   Clinical Therapist

## 2021-04-08 NOTE — Nursing Note (Signed)
Patient came in for urine drug screen. Patient admits that she still has COC in her system. Patient was Positive for COC and BUP. Patient was observed. Will send out for levels and confirmation.

## 2021-04-11 ENCOUNTER — Other Ambulatory Visit (INDEPENDENT_AMBULATORY_CARE_PROVIDER_SITE_OTHER): Payer: Self-pay | Admitting: FAMILY PRACTICE

## 2021-04-11 ENCOUNTER — Other Ambulatory Visit: Payer: Self-pay

## 2021-04-11 ENCOUNTER — Ambulatory Visit (INDEPENDENT_AMBULATORY_CARE_PROVIDER_SITE_OTHER): Payer: Medicaid Other

## 2021-04-11 DIAGNOSIS — F1190 Opioid use, unspecified, uncomplicated: Secondary | ICD-10-CM

## 2021-04-11 DIAGNOSIS — F1121 Opioid dependence, in remission: Secondary | ICD-10-CM

## 2021-04-11 MED ORDER — BUPRENORPHINE 8 MG-NALOXONE 2 MG SUBLINGUAL TABLET
2.0000 | SUBLINGUAL_TABLET | Freq: Every day | SUBLINGUAL | 0 refills | Status: DC
Start: 2021-04-11 — End: 2021-04-14

## 2021-04-11 NOTE — Nursing Note (Signed)
Patient came in for urine drug screen today. Patient denies drug use or relapse. Patient positive for BUP, and COC. Patient was observed. Will send out for levels and confirmation.     Morghan Stotler, MA  04/11/2021, 13:25

## 2021-04-12 ENCOUNTER — Telehealth (INDEPENDENT_AMBULATORY_CARE_PROVIDER_SITE_OTHER): Payer: Self-pay

## 2021-04-12 ENCOUNTER — Encounter (INDEPENDENT_AMBULATORY_CARE_PROVIDER_SITE_OTHER): Payer: Self-pay

## 2021-04-12 NOTE — Telephone Encounter (Signed)
Attempted to contact pt regarding individual therapy. Unable to leave a voicemail. MyChart message was sent.

## 2021-04-14 ENCOUNTER — Ambulatory Visit (INDEPENDENT_AMBULATORY_CARE_PROVIDER_SITE_OTHER): Payer: Self-pay | Admitting: Counselor - Mental Health Counselor

## 2021-04-14 ENCOUNTER — Encounter (INDEPENDENT_AMBULATORY_CARE_PROVIDER_SITE_OTHER): Payer: Self-pay

## 2021-04-14 ENCOUNTER — Ambulatory Visit (INDEPENDENT_AMBULATORY_CARE_PROVIDER_SITE_OTHER): Payer: Medicaid Other

## 2021-04-14 ENCOUNTER — Telehealth: Payer: Medicaid Other | Admitting: FAMILY PRACTICE

## 2021-04-14 ENCOUNTER — Other Ambulatory Visit: Payer: Self-pay

## 2021-04-14 ENCOUNTER — Encounter (INDEPENDENT_AMBULATORY_CARE_PROVIDER_SITE_OTHER): Payer: Self-pay | Admitting: FAMILY PRACTICE

## 2021-04-14 DIAGNOSIS — F1190 Opioid use, unspecified, uncomplicated: Secondary | ICD-10-CM

## 2021-04-14 DIAGNOSIS — Z79899 Other long term (current) drug therapy: Secondary | ICD-10-CM

## 2021-04-14 DIAGNOSIS — F1121 Opioid dependence, in remission: Secondary | ICD-10-CM

## 2021-04-14 MED ORDER — BUPRENORPHINE 8 MG-NALOXONE 2 MG SUBLINGUAL TABLET
2.0000 | SUBLINGUAL_TABLET | Freq: Every day | SUBLINGUAL | 0 refills | Status: DC
Start: 2021-04-14 — End: 2021-04-15

## 2021-04-14 NOTE — Progress Notes (Signed)
BEHAVIORAL MEDICINE, [facility]   [address]  [place] [address]     Outpatient COAT Progress Note    Name: Ellen Dillon MRN:  [id number]   Date: 04/14/2021 Age: 40 y.o.         Ellen Dillon was seen in the COAT Clinic. This clinic visit included medication management, addiction education, and evaluation of progress in recovery.      Subjective:     Intake: Prescribed opioids at age 19 with Lorcets 10's and then went to Percocet 5's, and then one day at age 21 her doc said he wasn't going to prescribe them any more.  Bought them off the street for a long time(mother helped her buy them).  Then became pregnant and wasn't using during that time.  Ended up having a miscarriage.  Went with [facility] for 6 years, but go discharged for missing a random.  Went to [facility] for a month, but didn't like the group and the doc, and the tele visit.  Allergic to the films.  Tablets do fine for her.  Left there in September 2021, and then went to bridge program and went to [facility] from Oct until 2 weeks ago.  She states that  They told her that another patient had told them she had offered them drugs, which she states she hasn't.  Raped at age 22, and states that she doesn't think about it.  Used to get counseling for that, but no longer needs it.     1/27: Patient has 7 days sober. Denies c, wd, se.  Did not go to any meetings.  Doing well.  No new concerns.     2/3: Patient has 14 days sober.  Denies c, wd, se. 2 podcasts and no meetings. No concerns.     2/10: Patient has 2 days sober.   Denies c, wd, se.  Went to [facility] [employer] [facility].  She denied use last week, but when she came to screen she admitted to staff that she had used.  Stated she wasn't comfortable revealing this to the group.  Will explicitly states that she needs to call and let us know before group or during group of any relapses.      2/17: Patient has 2 days sober, smoked marijuana.  Denies c, wd, se.  Went to 2  meetings.  Had a bad week.     2/24: Patient has 7 days sober, but then revealed that she had a couple Mike[employer] Hard Lemonades yesterday.  She states she was unaware that she wasn't allowed to drink.  We updated her on our policies.  We were also able to confirm that she did call us last week to reschedule her individual therapy appointment.  She was given the wrong number, and ultimately we did not get the message.  We updated her with the correct number.     3/3: Patient has 1 day sober.  Used cocaine yesterday.  Denies c, wd, se.     3/10: States she has had 4 days sober and used alcohol only.  Went to [facility] and is 8 behind.  Denies c, wd, se.  Requesting to start hydroxyzine.      3/17:  2 days sober, used cocaine and alcohol.  States she went on a binger because of her friend passing away from an overdose.  Went to 1.5 meetings.  Now 11 behind.  Denies wd, or se.  Denies cravings.  Would like to see  Belinda soon.     3/24: 4 days sober.  Used cocaine.  Went to [employer] and is 12 behind.  Denies c, wd, se.  Daughter and mother are both sick with the GI bug.       3/31: 11 days sober from opioids.  She stated that she has had alcohol last night.  She stated that she attended 3 meetings.  Currently denied any cravings for opioids.  Denied any side effects from Suboxone.    4/7: 1 day sober, used cocaine.  Went to [employer] and is still 9 behind.  Denies any c, wd, se.  States she got a new job and might get overloaded, but it will get her out of her house and one step towards independence because it is right behind her house and so transportation isn't an issue.     4/14: 7 days sober.  Went to 2 meetings, and is 11 behind.  Started her new job.  Denies c, wd, se.  New job is a lot, but a good thing. Patient NC/NS her random and an individual therapy.  Will be on intensive for the next two weeks. Discussed her continued use of cocaine and EtOH, and she was very dismissive.  She stated this program was for  opioids, not everything else.  Also stated you all don't give any meds for anything else.  I assured her there are no medications for other drug use.  She also stated she is 40 years old and if she wants a glass of wine, she should be allowed to have one.  She felt shew as targeted, because other people don't show up for group sometimes.  We assured her that people who NC/NS will have penalties, but we impose them privately and not within the entire group.  She has very poor insight, and is not taking accountability for herself.     4/21:  Patient states that she has a family emergency.  Her ex-boyfriend was arrested and she has to go get him.  She is very upset that she has to tell me while also telling the whole group.  She insists that she can't be in group, and requests to come tomorrow for group.  She states she doesn't need to bail him out, but she has to go get him.  She does not have a vehicle or license.  Gave her a script and told her to come to group tomorrow at 10 am.      Current MAT dose: 16 mg Daily    Objective:  Video Visit.  Patient is in no acute distress, alert and oriented, and dressed appropriately.  Speech is clear and coherent.  Thought process is linear.  No indication of responding to internal stimuli.      Urine drug screen review:  Intake: 1/20: Bup, Coc, EtOH  2/3: Bup, Coc, EtOH  2/11: Bup, Coc, EtOH  2/24: Bup, Coc, EtOH  3/3: Bup, Coc, EtOH  3/17: Bup, Coc, EtOH  4/7: Bup, Coc, EtOH  4/14: Bup, Coc, EtOH  4/15: Bup, Coc, EtOH  4/18: Bup, coc, EtOH    BOP Review Date: 03/31/21    Lab Review:   Hepatitis A Neg  Hepatitis B Neg  Hepatitis C Neg  HIV  Neg  EtOH None Detected  LFTs Unremarkable      Diagnoses and all orders for this visit:    Opioid use disorder, severe, in early remission, on maintenance therapy (CMS HCC)  -  buprenorphine-naloxone (SUBOXONE) 8-2 mg Sublingual Tablet, Sublingual; Place 2 Tablets under the tongue Once a day        Continue current dose of Suboxone and RTO  in 1 day for COAT.    Arlyn Dunning, MD  04/14/2021, 14:17          TELEMEDICINE DOCUMENTATION:    Patient Location:  MyChart video visit from home address: [address]  [facility] [address]    Patient/family aware of provider location:  yes  Patient/family consent for telemedicine:  yes  Examination observed and performed by:  Arlyn Dunning, MD

## 2021-04-14 NOTE — Nursing Note (Signed)
Patient came in for urine drug screen. Patient is on intensive therapy. Patient denies drug use or relapse. Patient positive for BUP and THC. Patient was observed. Will send out for levels and confirmation.     Morghan Stotler, MA  04/14/2021, 13:20

## 2021-04-15 ENCOUNTER — Ambulatory Visit (INDEPENDENT_AMBULATORY_CARE_PROVIDER_SITE_OTHER): Payer: Medicaid Other

## 2021-04-15 ENCOUNTER — Encounter (INDEPENDENT_AMBULATORY_CARE_PROVIDER_SITE_OTHER): Payer: Self-pay | Admitting: FAMILY PRACTICE

## 2021-04-15 ENCOUNTER — Telehealth: Payer: Medicaid Other | Admitting: FAMILY PRACTICE

## 2021-04-15 ENCOUNTER — Encounter (INDEPENDENT_AMBULATORY_CARE_PROVIDER_SITE_OTHER): Payer: Self-pay | Admitting: Counselor - Mental Health Counselor

## 2021-04-15 ENCOUNTER — Telehealth: Payer: Medicaid Other

## 2021-04-15 DIAGNOSIS — F1121 Opioid dependence, in remission: Secondary | ICD-10-CM

## 2021-04-15 DIAGNOSIS — Z79899 Other long term (current) drug therapy: Secondary | ICD-10-CM

## 2021-04-15 DIAGNOSIS — F1190 Opioid use, unspecified, uncomplicated: Secondary | ICD-10-CM

## 2021-04-15 DIAGNOSIS — F1120 Opioid dependence, uncomplicated: Secondary | ICD-10-CM

## 2021-04-15 MED ORDER — BUPRENORPHINE 8 MG-NALOXONE 2 MG SUBLINGUAL TABLET
2.0000 | SUBLINGUAL_TABLET | Freq: Every day | SUBLINGUAL | 0 refills | Status: DC
Start: 2021-04-15 — End: 2021-04-21

## 2021-04-15 NOTE — Progress Notes (Signed)
BEHAVIORAL MEDICINE, [facility]   [address]  [place] [address]     Outpatient COAT Progress Note    Name: Ellen Dillon MRN:  [id number]   Date: 04/15/2021 Age: 40 y.o.         Ellen Dillon was seen in the COAT Clinic. This clinic visit included medication management, addiction education, and evaluation of progress in recovery.      Subjective:     Intake: Prescribed opioids at age 40 with Lorcets 10's and then went to Percocet 5's, and then one day at age 24 her doc said he wasn't going to prescribe them any more.  Bought them off the street for a long time(mother helped her buy them).  Then became pregnant and wasn't using during that time.  Ended up having a miscarriage.  Went with [facility] for 6 years, but go discharged for missing a random.  Went to [facility] for a month, but didn't like the group and the doc, and the tele visit.  Allergic to the films.  Tablets do fine for her.  Left there in September 2021, and then went to bridge program and went to [facility] from Oct until 2 weeks ago.  She states that  They told her that another patient had told them she had offered them drugs, which she states she hasn't.  Raped at age 53, and states that she doesn't think about it.  Used to get counseling for that, but no longer needs it.     1/27: Patient has 7 days sober. Denies c, wd, se.  Did not go to any meetings.  Doing well.  No new concerns.     2/3: Patient has 14 days sober.  Denies c, wd, se. 2 podcasts and no meetings. No concerns.     2/10: Patient has 2 days sober.   Denies c, wd, se.  Went to [employer].  She denied use last week, but when she came to screen she admitted to staff that she had used.  Stated she wasn't comfortable revealing this to the group.  Will explicitly states that she needs to call and let us know before group or during group of any relapses.      2/17: Patient has 2 days sober, smoked marijuana.  Denies c, wd, se.  Went to 2  meetings.  Had a bad week.     2/24: Patient has 7 days sober, but then revealed that she had a couple Mike's Hard Lemonades yesterday.  She states she was unaware that she wasn't allowed to drink.  We updated her on our policies.  We were also able to confirm that she did call us last week to reschedule her individual therapy appointment.  She was given the wrong number, and ultimately we did not get the message.  We updated her with the correct number.     3/3: Patient has 1 day sober.  Used cocaine yesterday.  Denies c, wd, se.     3/10: States she has had 4 days sober and used alcohol only.  Went to [facility] and is 8 behind.  Denies c, wd, se.  Requesting to start hydroxyzine.      3/17:  2 days sober, used cocaine and alcohol.  States she went on a binger because of her friend passing away from an overdose.  Went to 1.5 meetings.  Now 11 behind.  Denies wd, or se.  Denies cravings.  Would like to see  Belinda soon.     3/24: 4 days sober.  Used cocaine.  Went to [facility] and is 12 behind.  Denies c, wd, se.  Daughter and mother are both sick with the GI bug.       3/31: 11 days sober from opioids.  She stated that she has had alcohol last night.  She stated that she attended 3 meetings.  Currently denied any cravings for opioids.  Denied any side effects from Suboxone.    4/7: 1 day sober, used cocaine.  Went to [employer] and is still 9 behind.  Denies any c, wd, se.  States she got a new job and might get overloaded, but it will get her out of her house and one step towards independence because it is right behind her house and so transportation isn't an issue.     4/14: 7 days sober.  Went to 2 meetings, and is 11 behind.  Started her new job.  Denies c, wd, se.  New job is a lot, but a good thing. Patient NC/NS her random and an individual therapy.  Will be on intensive for the next two weeks. Discussed her continued use of cocaine and EtOH, and she was very dismissive.  She stated this program was for  opioids, not everything else.  Also stated you all don't give any meds for anything else.  I assured her there are no medications for other drug use.  She also stated she is 40 years old and if she wants a glass of wine, she should be allowed to have one.  She felt she was targeted, because other people don't show up for group sometimes.  We assured her that people who NC/NS will have penalties, but we impose them privately and not within the entire group.  She has very poor insight, and is not taking accountability for herself.     4/21:  Patient states that she has a family emergency.  Her ex-boyfriend was arrested and she has to go get him.  She is very upset that she has to tell me while also telling the whole group.  She insists that she can't be in group, and requests to come tomorrow for group.  She states she doesn't need to bail him out, but she has to go get him.  She does not have a vehicle or license.  Gave her a script and told her to come to group tomorrow at 10 am.      4/22:  Make up group today.  States the stress is the same today, and used last Friday.  A lot of cocaine.  She states that the rest is "private".  Went to [employer] [facility].  Having some cravings, but denies wd, se.      Current MAT dose: 16 mg Daily    Objective:  Video Visit.  Patient is in no acute distress, alert and oriented, and dressed appropriately.  Speech is clear and coherent.  Thought process is linear.  No indication of responding to internal stimuli.      Urine drug screen review:  Intake: 1/20: Bup, Coc, EtOH  2/3: Bup, Coc, EtOH  2/11: Bup, Coc, EtOH  2/24: Bup, Coc, EtOH  3/3: Bup, Coc, EtOH  3/17: Bup, Coc, EtOH  4/7: Bup, Coc, EtOH  4/14: Bup, Coc, EtOH  4/15: Bup, Coc, EtOH  4/18: Bup, coc, EtOH    BOP Review Date: 03/31/21    Lab Review:   Hepatitis A Neg  Hepatitis B Neg  Hepatitis C Neg  HIV  Neg  EtOH None Detected  LFTs Unremarkable      Diagnoses and all orders for this visit:    Opioid use disorder, severe, in early  remission, on maintenance therapy (CMS HCC)  -     buprenorphine-naloxone (SUBOXONE) 8-2 mg Sublingual Tablet, Sublingual; Place 2 Tablets under the tongue Once a day for 6 days        Continue current dose of Suboxone and RTO in 6 days for COAT.    Arlyn Dunning, MD  04/15/2021, 10:32          TELEMEDICINE DOCUMENTATION:    Patient Location:  MyChart video visit from home address: [address]  [facility] [place] [facility] [address]    Patient/family aware of provider location:  yes  Patient/family consent for telemedicine:  yes  Examination observed and performed by:  Arlyn Dunning, MD

## 2021-04-15 NOTE — Nursing Note (Signed)
Patient came in for urine drug screen per Intensive contract. Patient denies drug use or relapse. Patient positive for BUP and COC. Patient was observed. Will send out for levels and confirmation.     Morghan Stotler, MA  04/15/2021, 09:56

## 2021-04-15 NOTE — Progress Notes (Signed)
[facility] BEHAVIORAL MEDICINE AND PSYCHIATRY  OPERATED BY [facility] MEDICINE AND [facility] PHYSICIANS    [facility] - [address]  [address]  [place], [place] [address]  [phone]      Group Therapy Progress Note      TELEMEDICINE DOCUMENTATION:    Patient Location:  MyChart video visit from [address] [place], [place] [address]     Patient/family aware of provider location:  yes  Patient/family consent for telemedicine:  yes  Examination observed and performed by:  Anda Kraft, STUDENT SOCIAL WORKER        [name]  Date of Service:  04/15/2021  TIME:  1448-1856  DURATION:  60 min  NUMBER IN GROUP:  8          CHIEF COMPLAINT:  Group Therapy CPT 31497 Addiction Problem on COAT Maintenance Program.      Subjective:  Ellen Dillon is seen for virtual group therapy and is participating in a personal recovery program, which includes attending this Comprehensive Opioid Addiction Treatment Program.  Anays reports no relapses since last group session.  Hayla reports attending four support recovery meetings for the week.   Alzada reports maintaining opiate recovery and being compliant with medications.     Group began with individual check ins and introductions as needed.    Group Topic- Shielding ourselves from things that can be uncomfortable.  The group discussed common mechanisms individuals use for situations or conversations that one wants to avoid.  The group identified how these shielding mechanisms played a part in their addiction.  The group discussed situations they have utilized shielding mechanisms in that came with consequences they have to accept. Ryiah said she most commonly utilizes pacifying, by using humor or changing the subject in shielding herself from uncomfortable situations.   Psychoeducation provided on how to effective engage in situations that they are uncomfortable or situations they are afraid of.      Observation:  Mood: within normal limits  Affect: consistent  with mood      Thought process: within normal limits  Thought Content:  Within normal limits  Interpersonal:  Discussed issues, Attentive, Displayed insight and Provided feedback  Participation: Full  Appearance: neatly dressed, dressed appropriately and well groomed      Assessment:     ICD-10-CM    1. Opioid use disorder, severe, on maintenance therapy, dependence (CMS HCC)  F11.20          Procedure:  02637 -  Patient attended COAT Group. This is a weekly process group designed to be appropriate for individuals with a substance use disorder.  The focus of this group was to provide supportive therapy, establish mutual support, and provide psycho-education for the biological aspects of addiction, recovery, and the efficacy of  the development of a recovery plan.   Encouragement of attendance of recovery meetings, identification of triggers for use/relapse and the development of a supportive social network were also covered.  Therapist utilized Motivational Interviewing, Cognitive Behavioral Therapy and person centered strategies to provide evidence based and supportive therapy. Therapist facilitated group support and discussion to include relapse prevention, building a sound recovery program, and highlight the practice of new recovery tools.       Treatment Plan:   Abstinence   Medication compliance  Increase effective coping skills  Decrease stress  Participate in weekly group therapy  Participate in monthly OP individual therapy  Open communication and honesty   Follow recommendations of treatment  Anda Kraft, STUDENT SOCIAL WORKER  04/15/2021, 12:50        *Patient was seen independently by student intern.  The cosigning therapist was available, but did not participate in the session or management of this patient unless otherwise noted.  I reviewed the documentation, discussed this case with the aforementioned intern, and I agree with the plan of care as documented in the student intern's note.  Any  exceptions/additions are edited/noted.       Rolm Gala. Rio Kidane, M.S.Ed, NCC, CCTP, LPC, ALPS  Microbiologist # [phone]  [facility] Approved Licensed Professional Supervisor  MD Licensed Financial controller # [phone]  [facility]: COAT Program

## 2021-04-18 ENCOUNTER — Telehealth (INDEPENDENT_AMBULATORY_CARE_PROVIDER_SITE_OTHER): Payer: Self-pay | Admitting: FAMILY PRACTICE

## 2021-04-18 NOTE — Telephone Encounter (Signed)
Called patient per her request.  Did not answer and no ability to leave a message.     Arlyn Dunning, MD  04/18/2021, 08:37

## 2021-04-21 ENCOUNTER — Ambulatory Visit (INDEPENDENT_AMBULATORY_CARE_PROVIDER_SITE_OTHER): Payer: Medicaid Other

## 2021-04-21 ENCOUNTER — Encounter (INDEPENDENT_AMBULATORY_CARE_PROVIDER_SITE_OTHER): Payer: Self-pay | Admitting: FAMILY PRACTICE

## 2021-04-21 ENCOUNTER — Telehealth: Payer: Medicaid Other | Admitting: Counselor - Mental Health Counselor

## 2021-04-21 ENCOUNTER — Telehealth: Payer: Medicaid Other | Admitting: FAMILY PRACTICE

## 2021-04-21 ENCOUNTER — Other Ambulatory Visit: Payer: Self-pay

## 2021-04-21 DIAGNOSIS — Z79899 Other long term (current) drug therapy: Secondary | ICD-10-CM

## 2021-04-21 DIAGNOSIS — F1120 Opioid dependence, uncomplicated: Secondary | ICD-10-CM

## 2021-04-21 DIAGNOSIS — F1121 Opioid dependence, in remission: Secondary | ICD-10-CM

## 2021-04-21 DIAGNOSIS — F1190 Opioid use, unspecified, uncomplicated: Secondary | ICD-10-CM

## 2021-04-21 MED ORDER — BUPRENORPHINE 8 MG-NALOXONE 2 MG SUBLINGUAL TABLET
2.0000 | SUBLINGUAL_TABLET | Freq: Every day | SUBLINGUAL | 0 refills | Status: DC
Start: 2021-04-21 — End: 2021-04-28

## 2021-04-21 NOTE — Progress Notes (Signed)
[facility] BEHAVIORAL MEDICINE AND PSYCHIATRY  OPERATED BY [facility] MEDICINE AND [facility] PHYSICIANS    [facility] - [address]  [address]  [place], [place] [address]  [phone]      Group Therapy Progress Note      TELEMEDICINE DOCUMENTATION:    Patient Location:  MyChart video visit from [facility]    Patient/family aware of provider location:  yes  Patient/family consent for telemedicine:  yes  Examination observed and performed by:  Mathis Fare, LPC        Ellen Dillon  Date of Service:  04/21/2021  TIME:  1330-1430  DURATION:  60 min  NUMBER IN GROUP:  8          CHIEF COMPLAINT:  Group Therapy CPT 90853 Addiction Problem on COAT Maintenance Program.      Subjective:  Ellen Dillon is seen for virtual group therapy and is participating in a personal recovery program, which includes attending this Comprehensive Opioid Addiction Treatment Program.  Ellen Dillon reports having 3 days sober as she had a relapse on cocaine.  Ellen Dillon reports attending 4 support recovery meetings for the week.   Ellen Dillon reports maintaining recovery and being compliant with medications.  She did identify that cravings have been difficult.    Group began with individual check ins and icebreaker activity.  Group members were given a positive affirmation and asked to answer an empowering question to foster group cohesion and build upon self-esteem/self-confidence.    Group Topic- "Breaking the Pattern of Repeated Mistakes"  Group discussion explored the importance of learning from past mistakes, what happens when we don't learn from them, and lessons we have learned in our lives to assist in recovery and overall wellness.  Specifically, lessons learned related to relationships, feelings/emotions, habits, attitude, friends, communication, and family.  Members identified what they have learned and how they are using this information to further their recovery.    Ellen Dillon shared actively and interacted with  peers positively.  She shared that she is working to gain some independence in her life and would like to obtain her driver's license and get her own place to live.  Additionally, she shared that she has come to learn through her recovery that two people with addiction are not good for each other.    Homework Assigned- Members were asked to identify one thing they would like to change and how they can do that this week.      Observation:  Mood: within normal limits  Affect: consistent with mood      Thought process: within normal limits  Thought Content:  Within normal limits  Interpersonal:  Discussed issues and distracted with other group members in the office.  Participation: Full  Appearance: well groomed      Assessment:     ICD-10-CM    1. 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 This is a weekly process group designed to be appropriate for individuals with a substance use disorder.  The focus of this group was to provide supportive therapy, establish mutual support, and provide psycho-education for the biological aspects of addiction, recovery, and the efficacy of  the development of a recovery plan.   Encouragement of attendance of recovery meetings, identification of triggers for use/relapse and the development of a supportive social network were also covered.  Therapist utilized Radiation protection practitioner,  Cognitive Behavioral Therapy and person centered strategies to provide evidence based and supportive therapy. Therapist facilitated group support and discussion to include relapse prevention, building a sound recovery program, and highlight the practice of new recovery tools.       Treatment Plan:    Abstinence    Medication compliance   Increase effective coping skills   Decrease stress   Participate in weekly group therapy   Participate in monthly OP individual therapy   Open communication and  honesty    Follow recommendations of treatment         Mathis Fare, LPC  04/21/2021, 15:23

## 2021-04-21 NOTE — Progress Notes (Signed)
BEHAVIORAL MEDICINE, [facility]   [address]  [place] [address]     Outpatient COAT Progress Note    Name: Ellen Dillon MRN:  [id number]   Date: 04/21/2021 Age: 40 y.o.         Ellen Dillon was seen in the COAT Clinic. This clinic visit included medication management, addiction education, and evaluation of progress in recovery.      Subjective:     Intake: Prescribed opioids at age 40 with Lorcets 10's and then went to Percocet 5's, and then one day at age 40 her doc said he wasn't going to prescribe them any more.  Bought them off the street for a long time(mother helped her buy them).  Then became pregnant and wasn't using during that time.  Ended up having a miscarriage.  Went with [facility] for 6 years, but go discharged for missing a random.  Went to [facility] for a month, but didn't like the group and the doc, and the tele visit.  Allergic to the films.  Tablets do fine for her.  Left there in September 2021, and then went to bridge program and went to [facility] from Oct until 2 weeks ago.  She states that  They told her that another patient had told them she had offered them drugs, which she states she hasn't.  Raped at age 40, and states that she doesn't think about it.  Used to get counseling for that, but no longer needs it.     1/27: Patient has 7 days sober. Denies c, wd, se.  Did not go to any meetings.  Doing well.  No new concerns.     2/3: Patient has 14 days sober.  Denies c, wd, se. 2 podcasts and no meetings. No concerns.     2/10: Patient has 2 days sober.   Denies c, wd, se.  Went to [facility] [employer] [facility].  She denied use last week, but when she came to screen she admitted to staff that she had used.  Stated she wasn't comfortable revealing this to the group.  Will explicitly states that she needs to call and let us know before group or during group of any relapses.      2/17: Patient has 2 days sober, smoked marijuana.  Denies c, wd, se.  Went to 2  meetings.  Had a bad week.     2/24: Patient has 7 days sober, but then revealed that she had a couple Mike's Hard Lemonades yesterday.  She states she was unaware that she wasn't allowed to drink.  We updated her on our policies.  We were also able to confirm that she did call us last week to reschedule her individual therapy appointment.  She was given the wrong number, and ultimately we did not get the message.  We updated her with the correct number.     3/3: Patient has 1 day sober.  Used cocaine yesterday.  Denies c, wd, se.     3/10: States she has had 4 days sober and used alcohol only.  Went to [facility] and is 8 behind.  Denies c, wd, se.  Requesting to start hydroxyzine.      3/17:  2 days sober, used cocaine and alcohol.  States she went on a binger because of her friend passing away from an overdose.  Went to 1.5 meetings.  Now 11 behind.  Denies wd, or se.  Denies cravings.  Would like to see  Belinda soon.     3/24: 4 days sober.  Used cocaine.  Went to [employer] [facility] and is 12 behind.  Denies c, wd, se.  Daughter and mother are both sick with the GI bug.       3/31: 11 days sober from opioids.  She stated that she has had alcohol last night.  She stated that she attended 3 meetings.  Currently denied any cravings for opioids.  Denied any side effects from Suboxone.    4/7: 1 day sober, used cocaine.  Went to [employer] and is still 9 behind.  Denies any c, wd, se.  States she got a new job and might get overloaded, but it will get her out of her house and one step towards independence because it is right behind her house and so transportation isn't an issue.     4/14: 7 days sober.  Went to 2 meetings, and is 11 behind.  Started her new job.  Denies c, wd, se.  New job is a lot, but a good thing. Patient NC/NS her random and an individual therapy.  Will be on intensive for the next two weeks. Discussed her continued use of cocaine and EtOH, and she was very dismissive.  She stated this program was for  opioids, not everything else.  Also stated you all don't give any meds for anything else.  I assured her there are no medications for other drug use.  She also stated she is 40 years old and if she wants a glass of wine, she should be allowed to have one.  She felt she was targeted, because other people don't show up for group sometimes.  We assured her that people who NC/NS will have penalties, but we impose them privately and not within the entire group.  She has very poor insight, and is not taking accountability for herself.     4/21:  Patient states that she has a family emergency.  Her ex-boyfriend was arrested and she has to go get him.  She is very upset that she has to tell me while also telling the whole group.  She insists that she can't be in group, and requests to come tomorrow for group.  She states she doesn't need to bail him out, but she has to go get him.  She does not have a vehicle or license.  Gave her a script and told her to come to group tomorrow at 10 am.      4/22:  Make up group today.  States the stress is the same today, and used last Friday.  A lot of cocaine.  She states that the rest is "private".  Went to [employer] [facility].  Having some cravings, but denies wd, se.      4/28: States she has 3 days sober, used cocaine.  Went to [employer] and has had cravings for cocaine due to the stress in her house. She is considering getting a license and getting her own place to live.  First and foremost she wants to have a meeting with me and her mother to discuss what's going on.  She has completed all of her intensive requirements.       Current MAT dose: 16 mg Daily    Objective:  Video Visit.  Patient is in no acute distress, alert and oriented, and dressed appropriately.  Speech is clear and coherent.  Thought process is linear.  No indication of responding to internal stimuli.  Urine drug screen review:  Intake: 1/20: Bup, Coc, EtOH  2/3: Bup, Coc, EtOH  2/11: Bup, Coc, EtOH  2/24: Bup, Coc,  EtOH  3/3: Bup, Coc, EtOH  3/17: Bup, Coc, EtOH  4/7: Bup, Coc, EtOH  4/14: Bup, Coc, EtOH  4/15: Bup, Coc, EtOH  4/18: Bup, coc, EtOH  4/22: Bup, Coc    BOP Review Date: 03/31/21    Lab Review:   Hepatitis A Neg  Hepatitis B Neg  Hepatitis C Neg  HIV  Neg  EtOH None Detected  LFTs Unremarkable      Diagnoses and all orders for this visit:    Opioid use disorder, severe, in early remission, on maintenance therapy (CMS HCC)  -     buprenorphine-naloxone (SUBOXONE) 8-2 mg Sublingual Tablet, Sublingual; Place 2 Tablets under the tongue Once a day        Continue current dose of Suboxone and RTO in 6 days for COAT.    Arlyn Dunning, MD  04/21/2021, 14:02        TELEMEDICINE DOCUMENTATION:    Patient Location:  MyChart video visit from home address: [address]  [facility] [address]    Patient/family aware of provider location:  yes  Patient/family consent for telemedicine:  yes  Examination observed and performed by:  Arlyn Dunning, MD

## 2021-04-21 NOTE — Nursing Note (Signed)
Patient came in for urine drug screen. Patient states last use of cocaine was 2 days ago. Patient denies relapse. Patient positive for BUP and COC. Patient was observed. Will send out for levels and confirmation.     Khalil Szczepanik, MA  04/21/2021, 14:29

## 2021-04-28 ENCOUNTER — Telehealth: Payer: Medicaid Other | Admitting: Counselor - Mental Health Counselor

## 2021-04-28 ENCOUNTER — Telehealth: Payer: Medicaid Other | Admitting: FAMILY PRACTICE

## 2021-04-28 ENCOUNTER — Encounter (INDEPENDENT_AMBULATORY_CARE_PROVIDER_SITE_OTHER): Payer: Self-pay | Admitting: FAMILY PRACTICE

## 2021-04-28 DIAGNOSIS — F1121 Opioid dependence, in remission: Secondary | ICD-10-CM

## 2021-04-28 DIAGNOSIS — Z79899 Other long term (current) drug therapy: Secondary | ICD-10-CM

## 2021-04-28 DIAGNOSIS — F1120 Opioid dependence, uncomplicated: Secondary | ICD-10-CM

## 2021-04-28 DIAGNOSIS — F419 Anxiety disorder, unspecified: Secondary | ICD-10-CM

## 2021-04-28 MED ORDER — HYDROXYZINE HCL 25 MG TABLET
25.0000 mg | ORAL_TABLET | Freq: Three times a day (TID) | ORAL | 0 refills | Status: DC | PRN
Start: 2021-04-28 — End: 2021-12-30

## 2021-04-28 MED ORDER — BUPRENORPHINE 8 MG-NALOXONE 2 MG SUBLINGUAL TABLET
2.0000 | SUBLINGUAL_TABLET | Freq: Every day | SUBLINGUAL | 0 refills | Status: DC
Start: 2021-04-28 — End: 2021-05-05

## 2021-04-28 NOTE — Progress Notes (Signed)
BEHAVIORAL MEDICINE, [facility]   [address]  [place] [address]     Outpatient COAT Progress Note    Name: Ellen Dillon MRN:  [id number]   Date: 04/28/2021 Age: 40 y.o.         Ellen Dillon was seen in the COAT Clinic. This clinic visit included medication management, addiction education, and evaluation of progress in recovery.      Subjective:     Intake: Prescribed opioids at age 40 with Lorcets 10's and then went to Percocet 5's, and then one day at age 40 her doc said he wasn't going to prescribe them any more.  Bought them off the street for a long time(mother helped her buy them).  Then became pregnant and wasn't using during that time.  Ended up having a miscarriage.  Went with [facility] for 6 years, but go discharged for missing a random.  Went to [facility] for a month, but didn't like the group and the doc, and the tele visit.  Allergic to the films.  Tablets do fine for her.  Left there in September 2021, and then went to bridge program and went to [facility] from Oct until 2 weeks ago.  She states that  They told her that another patient had told them she had offered them drugs, which she states she hasn't.  Raped at age 40, and states that she doesn't think about it.  Used to get counseling for that, but no longer needs it.     1/27: Patient has 7 days sober. Denies c, wd, se.  Did not go to any meetings.  Doing well.  No new concerns.     2/3: Patient has 14 days sober.  Denies c, wd, se. 2 podcasts and no meetings. No concerns.     2/10: Patient has 2 days sober.   Denies c, wd, se.  Went to [facility] [employer] [facility].  She denied use last week, but when she came to screen she admitted to staff that she had used.  Stated she wasn't comfortable revealing this to the group.  Will explicitly states that she needs to call and let us know before group or during group of any relapses.      2/17: Patient has 2 days sober, smoked marijuana.  Denies c, wd, se.  Went to 2 meetings.   Had a bad week.     2/24: Patient has 7 days sober, but then revealed that she had a couple Mike's Hard Lemonades yesterday.  She states she was unaware that she wasn't allowed to drink.  We updated her on our policies.  We were also able to confirm that she did call us last week to reschedule her individual therapy appointment.  She was given the wrong number, and ultimately we did not get the message.  We updated her with the correct number.     3/3: Patient has 1 day sober.  Used cocaine yesterday.  Denies c, wd, se.     3/10: States she has had 4 days sober and used alcohol only.  Went to [facility] and is 8 behind.  Denies c, wd, se.  Requesting to start hydroxyzine.      3/17:  2 days sober, used cocaine and alcohol.  States she went on a binger because of her friend passing away from an overdose.  Went to 1.5 meetings.  Now 11 behind.  Denies wd, or se.  Denies cravings.  Would like to see  Belinda soon.     3/24: 4 days sober.  Used cocaine.  Went to [employer] [facility] and is 12 behind.  Denies c, wd, se.  Daughter and mother are both sick with the GI bug.       3/31: 11 days sober from opioids.  She stated that she has had alcohol last night.  She stated that she attended 3 meetings.  Currently denied any cravings for opioids.  Denied any side effects from Suboxone.    4/7: 1 day sober, used cocaine.  Went to [employer] and is still 9 behind.  Denies any c, wd, se.  States she got a new job and might get overloaded, but it will get her out of her house and one step towards independence because it is right behind her house and so transportation isn't an issue.     4/14: 7 days sober.  Went to 2 meetings, and is 11 behind.  Started her new job.  Denies c, wd, se.  New job is a lot, but a good thing. Patient NC/NS her random and an individual therapy.  Will be on intensive for the next two weeks. Discussed her continued use of cocaine and EtOH, and she was very dismissive.  She stated this program was for opioids, not  everything else.  Also stated you all don't give any meds for anything else.  I assured her there are no medications for other drug use.  She also stated she is 40 years old and if she wants a glass of wine, she should be allowed to have one.  She felt she was targeted, because other people don't show up for group sometimes.  We assured her that people who NC/NS will have penalties, but we impose them privately and not within the entire group.  She has very poor insight, and is not taking accountability for herself.     4/21:  Patient states that she has a family emergency.  Her ex-boyfriend was arrested and she has to go get him.  She is very upset that she has to tell me while also telling the whole group.  She insists that she can't be in group, and requests to come tomorrow for group.  She states she doesn't need to bail him out, but she has to go get him.  She does not have a vehicle or license.  Gave her a script and told her to come to group tomorrow at 10 am.      4/22:  Make up group today.  States the stress is the same today, and used last Friday.  A lot of cocaine.  She states that the rest is "private".  Went to [employer].  Having some cravings, but denies wd, se.      4/28: States she has 3 days sober, used cocaine.  Went to [employer] and has had cravings for cocaine due to the stress in her house. She is considering getting a license and getting her own place to live.  First and foremost she wants to have a meeting with me and her mother to discuss what's going on.  She has completed all of her intensive requirements.       5/5: States she has 5 days sober, used cocaine.  Went to [employer] and is 6 behind. Positive for cravings, but denies wd, se. Stressed currently because she states she told her mom the truth and voluntarily left the house. Her and Callie are staying with  Callie's father.  She is requesting transfer to [facility] because it is close to where she is staying.      Current MAT dose: 16 mg  Daily    Objective:  Video Visit.  Patient is in no acute distress, alert and oriented, and dressed appropriately.  Speech is clear and coherent.  Thought process is linear.  No indication of responding to internal stimuli.      Urine drug screen review:  Intake: 1/20: Bup, Coc, EtOH  2/3: Bup, Coc, EtOH  2/11: Bup, Coc, EtOH  2/24: Bup, Coc, EtOH  3/3: Bup, Coc, EtOH  3/17: Bup, Coc, EtOH  4/7: Bup, Coc, EtOH  4/14: Bup, Coc, EtOH  4/15: Bup, Coc, EtOH  4/18: Bup, coc, EtOH  4/22: Bup, Coc  4/28: Bup, Coc, EtOH (Admitted to use)    BOP Review Date: 03/31/21    Lab Review:   Hepatitis A Neg  Hepatitis B Neg  Hepatitis C Neg  HIV  Neg  EtOH None Detected  LFTs Unremarkable      Diagnoses and all orders for this visit:    Opioid use disorder, severe, in early remission, on maintenance therapy (CMS HCC)  -     buprenorphine-naloxone (SUBOXONE) 8-2 mg Sublingual Tablet, Sublingual; Place 2 Tablets under the tongue Once a day        Continue current dose of Suboxone and RTO in 7 days for COAT.    Arlyn Dunning, MD  04/28/2021, 15:05      TELEMEDICINE DOCUMENTATION:    Patient Location:  MyChart video visit from the office today  Patient/family aware of provider location:  yes  Patient/family consent for telemedicine:  yes  Examination observed and performed by:  Arlyn Dunning, MD

## 2021-04-28 NOTE — Progress Notes (Signed)
[facility]  OPERATED BY [facility] MEDICINE AND [facility] PHYSICIANS    [facility] - [address]  [address]  [place], [place] [address]  [phone]      Group Therapy Progress Note      TELEMEDICINE DOCUMENTATION:    Patient Location:  MyChart video visit from [facility]    Patient/family aware of provider location:  yes  Patient/family consent for telemedicine:  yes  Examination observed and performed by:  Mathis Fare, LPC        [name]  Date of Service:  04/28/2021  TIME:  1330-1430  DURATION:  60 min  NUMBER IN GROUP:  9          CHIEF COMPLAINT:  Group Therapy CPT 90853 Addiction Problem on COAT Maintenance Program.  *Dr. Harvie Bridge is the Supervising Physician for the Group.      Subjective:  Ellen Dillon is seen for virtual group therapy and is participating in a personal recovery program, which includes attending this Comprehensive Opioid Addiction Treatment Program.  Ellen Dillon reports a relapse on cocaine 5 days ago.  Ellen Dillon reports she did not attend any recovery meetings for the week.  She reports she got honest about her substance use with her mother and left her mother's home as a result.  She's presently staying with her child's father which she describes as not an ideal situation.    Group began with individual check ins and introductions as needed.  Group members shared related to their experiences over the last week and offered support to one another.      Group Topic- Irrational Beliefs.  Psychoeducation was provided related to common irrational beliefs that can underlie anxiety, depression, addiction, and other uncomfortable emotions.  Explored how harboring these beliefs can impact wellbeing and then worked to challenge these thoughts.  Reviewed the connection between thoughts, feelings, and behaviors/events.  Identified a way to foster increased happiness in daily life as trying to feel less overwhelmed.  After some further  exploration, she agreed to trying to follow a basic routine for the week to have things feel less stressed.    Homework Assigned- Implement the self-identified action to increase personal happiness.      Observation:  Mood: within normal limits  Affect: full range      Thought process: within normal limits  Thought Content:  Within normal limits  Interpersonal:  Discussed issues, Attentive and Disruptive at times with peers.  Participation: Full  Appearance: well groomed      Assessment:     ICD-10-CM    1. 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 This is a weekly process group designed to be appropriate for individuals with a substance use disorder.  The focus of this group was to provide supportive therapy, establish mutual support, and provide psycho-education for the biological aspects of addiction, recovery, and the efficacy of  the development of a recovery plan.   Encouragement of attendance of recovery meetings, identification of triggers for use/relapse and the development of a supportive social network were also covered.  Therapist utilized Motivational Interviewing, Cognitive Behavioral Therapy and person centered strategies to provide evidence based and supportive therapy. Therapist facilitated group support and discussion to include relapse prevention, building a sound recovery program, and highlight the practice of new recovery tools.  Treatment Plan:    Abstinence    Medication compliance   Increase effective coping skills   Decrease stress   Participate in weekly group therapy   Participate in monthly OP individual therapy   Open communication and honesty    Follow recommendations of treatment         Mathis Fare, LPC  04/28/2021, 15:37

## 2021-04-28 NOTE — Addendum Note (Signed)
Addended by: Kandis Fantasia on: 04/28/2021 03:25 PM     Modules accepted: Orders

## 2021-04-29 NOTE — Progress Notes (Signed)
[facility]    DRUG AND ALCOHOL INTAKE     Patient Name: Ellen Dillon  MRN: [id number]  Date of Birth: Aug 05, 1981  Date of Evaluation: 01/14/2021    Time In:  8:00 a.m. -  Time Out:  9:00 a.m.    Information gathered from review of chart and patient interview.  This session was a face-to-face session conducted in-person.    Identification:   Patient is a 39 y.o. single Black/African American female from [place] [address], [place].    Chief Complaint:   Patient was referred to the BEHAVIORAL MEDICINE, [facility]   for addiction treatment.    Patient was referred to this Drug and Alcohol Intake by [facility] program.      History of Present Illness:  Client reported drug of choice is pain pills.  She she noted that she was 1st prescribed pain pills at 40 years old for bone spurs in her feet.  She noted her doctor abruptly stop the meds at 40 years old      SUBSTANCE USE - be specific on last 30 days of use   Yes/No 1st Use # days used in last month Most recent Avg daily use/method of use (oral, nasal, smoking, non-IV injection, IV)   ETOH   Yes 40 yo 12 Yesterday  1 Mike's hard lemonade 3 days per week   Opioids   Yes 40 yo (rx'd) 0 7 years ago  (ED note from 1-18 reported use of OXY "one week ago") Pain pills   Cannabis   Yes 40 yo 2 20 days ago    Benzodiazepines   Yes "Years ago" 0 "Years ago"    Barbituates   No n/a n/a n/a    Cocaine; crack Yes 41 yo (cocaine)  40 yo (crack) 2 3 days ago (cocaine) snort   Meth No    n/a n/a n/a    Other (LSD, PCP GHB, rohypnol, ecstasy, mushrooms) No n/a n/a n/a    Other stimulants (adderal, ritalin, desidrine) No n/a n/a n/a    Designer drugs (bath salts, K-2) No n/a n/a n/a    Inhalants  No n/a n/a n/a    Other Yes Last year (boot) 0 Last year "1 time thing"   Nicotine   Yes 40 yo 30 today 1/2 ppd   Caffeine Yes young 32 today Coffee, red bull 3-4 times per week; before baby 2 Red Bulls per day     In the past 30 days,  have you injected drugs?  No , Never    In the past 30 days, how often did you use a syringe/needle, cooker, cotton or water that someone else used? "Always, More than half the time, Half the time, Less than half the time, Never, Refused, Don't Know"     Never      Family History of Mental Health Dx Including Addiction:  Client reported the birth mother and birth father and "whole birth family" have addiction issues    Psychiatric Treatment History (Dx, Providers, suicide attempts, hospitalization, including the last 30 days):  Current providers:  None  Case manager:  None  History Inpatient:  None  History Outpatient:  [facility]- 6 years Suboxone program; [facility] - 1 month; [facility] - Oct 2021-Jan 2022  Suicide attempts:  None  She has not been committed for involuntary hospitalization.   Primary Care Provider: THOMPSON, DO, REBECCA    How would you rate your overall  health right now: "Excellent, Very Good, Good, Fair, Poor, Refused, Don't Know"  Good    In the past 30 days, not due to your use of alcohol or drugs, how many days have you: "__ Days, Refused, Don't Know":  Marland Kitchen Experienced serious depression  0  . Experienced serious anxiety or tension  15 "get overwhelmed, but not serious"  . Experienced hallucinations  0  . Experienced trouble understanding, concentrating, or remembering  "yes, I'm a mom"  . Experienced trouble controlling violent behavior  0  . Attempted suicide  0  . Been prescribed medication for psychological/emotional problem 0    How much have you been bothered by these psychological or emotional problems in the past 30 days? "Not At All, Slightly, Moderately, Considerably, Extremely, Refused, Don't Know"   Slightly      Medical History (including current pregnancy)  Patient Active Problem List   Diagnosis   . Heroin use complicating pregnancy   . History of pregnancy induced hypertension   . Anxiety   . Hx of induced abortion   . Family history of diabetes mellitus in  mother   . Cystic fibrosis carrier   . Seizure (CMS HCC)   . Marijuana use   . Hx: UTI (urinary tract infection)   . Encounter for monitoring Subutex maintenance therapy   . Hx of seizure disorder   . Tobacco use disorder     Past Medical History:   Diagnosis Date   . Anxiety    . Diet controlled White classification A1 gestational diabetes mellitus (GDM) 08/07/2019   . Gestational hypertension 09/01/2019   . Gestational hypertension, third trimester 08/07/2019   . High-risk pregnancy in third trimester 07/17/2019   . Hx of spontaneous abortion, currently pregnant, third trimester    . Hypertension     due to pregnancy   . Multigravida of advanced maternal age in third trimester 07/17/2019   . 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    . 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 Overweight(278.02)    . Panic attack    . Seizure (CMS [facility])     age 76    . Tobacco smoking complicating pregnancy, third trimester 02/27/2019       Past Surgical History:   Procedure Laterality Date   . HX BREAST BIOPSY      x3 cyst    . HX CYST INCISION AND DRAINAGE  LEFT   . HX DILATION AND CURETTAGE  01/04/12     Dilation and Curettage suction        Process Addictions:   Patient reports receiving substance abuse treatment. (as noted above)    Ellen Dillon denies history of uncontrolled/problematic gambling.     Ellen Dillon denies history of problematic pornography use.      Medications:     Current Outpatient Medications:   .  buprenorphine-naloxone (SUBOXONE) 8-2 mg Sublingual Tablet, Sublingual, Place 2 Tablets under the tongue Once a day, Disp: 14 Tablet, Rfl: 0  .  hydrOXYzine HCL (ATARAX) 25 mg Oral Tablet, Take 1 Tablet (25 mg total) by mouth Every 8 hours as needed for Anxiety, Disp: 90 Tablet, Rfl: 0  .  naloxone (NARCAN) 4 mg per spray nasal spray, 1 Spray by INTRANASAL route Every 2 minutes as needed (for overdose), Disp: 1 Each, Rfl: 0  .  sertraline (ZOLOFT) 50 mg Oral Tablet, Take 1 Tablet (50 mg total) by mouth Once a day  Indications: repeated episodes of anxiety, Disp: 30 Tablet,  Rfl: 1  .  traZODone (DESYREL) 50 mg Oral Tablet, Take 1 Tablet (50 mg total) by mouth Every night, Disp: 30 Tablet, Rfl: 1    Social History (childhood experiences, relationship with family in the past and present, history of abuse:)  Client reports she was very spoiled.  She noted her biological mother gave her up at 6 months, gave client to her aunt and uncle.  Client reported her aunt kept client when she divorced when client was 40 years old.  Client noted she considers this her true mom.  Client shared her mother got married at 89 this past October.  Client shared that her birth mother moved in with client in mother in October.  Client reports she hates it and that there is a lot of stress.    Positive Support System:  Ellen Dillon support system includes:  Mother and daughter    Level of education completed:  Patient has some college level education.  Client noted she completed 1 semester in college and got a certification in child development.    Currently in school or job training:  No    Currently employed:   Patient is not currently working.  Client reports she does not want a put her daughter in daycare.    Current living situation during the past 30 days:  Client noted she lives in a home with her mother, birth mother, and baby daughter    Do you have children: 2 (baby daughter and god-daughter that client raised since 54 mo old)  client reported she raised her got daughter as her own from 34-month-old.  She noted got daughter is now 36 years old and "a grown woman".  She stated God daughter turned 94 and wanted to be with her biological mother and had been living with biological mother until she passed away from an overdose.  Client noted got daughter is now living with her older sister and is "smoking weed and running".  Client noted she still sees God daughter and speaks with her regularly.    For following questions, options are "Not At All,  Somewhat, Considerably, Extremely, N/A, Refused, Don't Know"  . During the past 30 days, how stressful have things been for you because of your use of alcohol or other drugs?  Somewhat  . During the past 30 days, has your use of alcohol or other drugs caused you to give up important activities? Not  . During the past 30 days, has your use of alcohol or other drugs caused you to have emotional problems? Not    For following questions, options are "Yes, No, Refused, Don't Know"   . In the past 30 days, did you attend any voluntary self-help groups for recovery that were not affiliated with a religious or faith-based organization? If yes, how many times?   No  . In the past 30 days, did you attend any religious/faith-based recovery self help groups? If yes, how many times?No   . In the past 30 days, did you attend meetings of organizations that support recovery other that the organizations described above? If yes, how many times? No  . In the past 30 days, did you have interaction with family and/or friends that are support of your recovery? Yes  . To whom do you turn when you are having trouble?  Mother    Legal Problems:  Patient denies ongoing or past legal entanglements.    Mental Status Exam:  Ellen Dillon's insight is good,  thought process  is organized and logical, mood is anxious, and affect-broad and congruent to mood. Ellen Dillon is Fully oriented to person, place, time and situation.    Gwynevere's current stressors include Family Stressors and Unemployed.        UDS   - Amphetamine (AMP)   + Cocaine (COC)   + Marijuana (THC)   - Methamphetamine (mAMP)   - Opiates (OPI)   - Phencyclidine (PCP)   - Benzodiazepines (BZO)   - Tricyclic Antidepressants (TCA)   - Barbiturates (BAR)   - Oxycodone (OXY)   - Propoxyphine (PPX)   - Methadone (MTD)   + Bupenorphine (BUP)     Assessment:  Opioid use disorder, severe, on maintenance therapy, dependence (CMS HCC) [F11.20]    Summary and Recommendations: Ellen Dillon is a 40 y.o. single  Black/African American female with some college-level education. Ellen Dillon [facility] for an intake assessment for the COAT program.  Ellen Dillon does not have a history of psychiatric treatment. Ellen Dillon has sought treatment for addiction in the past. Ellen Dillon is Fully oriented to person, place, time and situation.     Ellen Dillon's current stressors include Family Stressors and Unemployed.  Client's goals for treatment include To successfully maintain sobriety by utilizing appropriate recovery skills.     Ellen Dillon is eligible for the COAT clinic. Ellen Dillon was given the contract and agreed to start on 01/20/2021.   Treatment plan at this time will be for Ellen Dillon to attend COAT program, which will include weekly group therapy appointments, sessions with the physician, monthly individual therapy, and any additional recommendations.  Additionally, Ellen Dillon will have random urine drug screens which may or may not be observed and medication counts.    Ellen Dillon, LPC, Clinical Therapist      Risk Assessment  Patient does not endorse current SI or HI.  * Patient has information and plan for safety and is willing to seek help if SI and/or HI are present.    Portions of this note may be dictated using voice recognition software or a dictation service. Variances in spelling and vocabulary are possible and unintentional. Not all errors are caught/corrected. Please notify the [facility] if any discrepancies are noted or if the meaning of any statement is not clear.

## 2021-05-02 ENCOUNTER — Encounter (INDEPENDENT_AMBULATORY_CARE_PROVIDER_SITE_OTHER): Payer: Self-pay | Admitting: FAMILY PRACTICE

## 2021-05-03 ENCOUNTER — Encounter (INDEPENDENT_AMBULATORY_CARE_PROVIDER_SITE_OTHER): Payer: Self-pay

## 2021-05-03 ENCOUNTER — Telehealth (INDEPENDENT_AMBULATORY_CARE_PROVIDER_SITE_OTHER): Payer: Self-pay

## 2021-05-03 ENCOUNTER — Telehealth (INDEPENDENT_AMBULATORY_CARE_PROVIDER_SITE_OTHER): Payer: Self-pay | Admitting: Family Medicine

## 2021-05-03 NOTE — Telephone Encounter (Signed)
Patient returned call to the office, states she does not need to transfer programs at this time. Case Manager, Remus Blake notified.      Renaldo Harrison, Service Support Coordinator  05/03/2021, 16:13

## 2021-05-03 NOTE — Telephone Encounter (Signed)
Attempted to contact pt regarding May individual therapy. Unable to leave a voicemail. Will send a MyChart message.

## 2021-05-03 NOTE — Telephone Encounter (Signed)
Patient currently under [facility] COAT Program [facility]). Expressed interest in transferring to [facility] COAT Program per Case Manager, due to closer location. Service Support Coordinator reached out to the patient at [phone] to discuss transfer.Unable to LVM due to mailbox not being set up, MyChart message sent to the patient.       Renaldo Harrison, Service Support Coordinator  05/03/2021, 16:09

## 2021-05-04 ENCOUNTER — Encounter (INDEPENDENT_AMBULATORY_CARE_PROVIDER_SITE_OTHER): Payer: Self-pay

## 2021-05-04 ENCOUNTER — Telehealth (INDEPENDENT_AMBULATORY_CARE_PROVIDER_SITE_OTHER): Payer: Self-pay

## 2021-05-04 NOTE — Telephone Encounter (Signed)
Attempted to contact patient for a random urine drug screen tomorrow (5/12). Unable to leave a voicemail as mailbox is not set up. Will send MyChart message.

## 2021-05-05 ENCOUNTER — Telehealth: Payer: Medicaid Other | Admitting: FAMILY PRACTICE

## 2021-05-05 ENCOUNTER — Other Ambulatory Visit: Payer: Self-pay

## 2021-05-05 ENCOUNTER — Telehealth: Payer: Medicaid Other | Admitting: Counselor - Mental Health Counselor

## 2021-05-05 ENCOUNTER — Encounter (INDEPENDENT_AMBULATORY_CARE_PROVIDER_SITE_OTHER): Payer: Self-pay | Admitting: FAMILY PRACTICE

## 2021-05-05 ENCOUNTER — Ambulatory Visit (INDEPENDENT_AMBULATORY_CARE_PROVIDER_SITE_OTHER): Payer: Medicaid Other

## 2021-05-05 DIAGNOSIS — F1410 Cocaine abuse, uncomplicated: Secondary | ICD-10-CM

## 2021-05-05 DIAGNOSIS — Z79899 Other long term (current) drug therapy: Secondary | ICD-10-CM

## 2021-05-05 DIAGNOSIS — F1190 Opioid use, unspecified, uncomplicated: Secondary | ICD-10-CM

## 2021-05-05 DIAGNOSIS — F1121 Opioid dependence, in remission: Secondary | ICD-10-CM

## 2021-05-05 DIAGNOSIS — F1120 Opioid dependence, uncomplicated: Secondary | ICD-10-CM

## 2021-05-05 MED ORDER — BUPRENORPHINE 8 MG-NALOXONE 2 MG SUBLINGUAL TABLET
2.0000 | SUBLINGUAL_TABLET | Freq: Every day | SUBLINGUAL | 0 refills | Status: DC
Start: 2021-05-05 — End: 2021-05-12

## 2021-05-05 NOTE — Progress Notes (Signed)
BEHAVIORAL MEDICINE AND PSYCHIATRY  OPERATED BY [facility] MEDICINE AND [facility] PHYSICIANS    [facility] - [address]  [address]  [place], [place] [address]  [phone]      Group Therapy Progress Note      TELEMEDICINE DOCUMENTATION:    Patient Location:  MyChart video visit from [facility]    Patient/family aware of provider location:  yes  Patient/family consent for telemedicine:  yes  Examination observed and performed by:  Mathis Fare, LPC        Ellen Dillon  Date of Service:  05/05/2021  TIME:  1330-1430  DURATION:  60 min  NUMBER IN GROUP:  8          CHIEF COMPLAINT:  Group Therapy CPT 75916 Addiction Problem on COAT Maintenance Program.  *Dr. Harvie Bridge is the Supervising Physician for the Group.      Subjective:  Ellen Dillon is seen for virtual group therapy and is participating in a personal recovery program, which includes attending this Comprehensive Opioid Addiction Treatment Program.  Ellen Dillon reports a relapse on cocaine about 4 days ago.  Ellen Dillon reports attending 6 support recovery meetings for the week.       Group began with individual check ins and introductions as needed.  Members shared their present mood states and offered support to one another.  Additionally, the group engaged in a mindful chair yoga exercise called soothing self-massage.  Psychoeducation was provided related to mindfulness, acupressure, and body scans.    Group Topic- The concept of "Instant Gratification" was thoroughly explored.  Defined instant gratification, explored the impacts of this on recovery, and explored delayed gratification as well as the impacts of this on recovery.  Specifically explored each members current commitment to their personal recovery programs whether they were partially or fully committed to their recovery.    Ellen Dillon states she still has one foot in recovery and one foot in past behavior patterns.  This prevents her from fully engaging in her recovery  program.  The group explored ways that they can take actions to more fully commit to recovery to aid in relapse prevention.  She states that her opiate addiction has been stabilized, but that she sees cocaine/alcohol/marijuana use as different.  She expressed she knows that it is a problem in this group, but that she struggles to shift her thinking in this area which ultimately impedes her progress in the program.    Homework Assigned- Members were asked to implement their self-identified action step to reduce their impulse of instant gratification and to welcome more delayed gratification into their lives.      Observation:  Mood: within normal limits  Affect: stable      Thought process: within normal limits  Thought Content:  Within normal limits  Interpersonal:  Discussed issues, Attentive and Provided feedback  Participation: Full  Appearance: neatly dressed, dressed appropriately and no apparent distress      Assessment:     ICD-10-CM    1. 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 This is a weekly process group designed to be appropriate for individuals with a substance use disorder.  The focus of this group was to provide supportive therapy, establish mutual support, and provide psycho-education for the biological aspects of addiction, recovery, and  the efficacy of  the development of a recovery plan.   Encouragement of attendance of recovery meetings, identification of triggers for use/relapse and the development of a supportive social network were also covered.  Therapist utilized Motivational Interviewing, Cognitive Behavioral Therapy and person centered strategies to provide evidence based and supportive therapy. Therapist facilitated group support and discussion to include relapse prevention, building a sound recovery program, and highlight the practice of new recovery  tools.       Treatment Plan:    Abstinence    Medication compliance   Increase effective coping skills   Decrease stress   Participate in weekly group therapy   Participate in monthly OP individual therapy   Open communication and honesty    Follow recommendations of treatment         Mathis Fare, LPC  05/05/2021, 15:36

## 2021-05-05 NOTE — Progress Notes (Signed)
BEHAVIORAL MEDICINE, [facility]   [address]  [place] [address]     Outpatient COAT Progress Note    Name: Ellen Dillon MRN:  [id number]   Date: 05/05/2021 Age: 40 y.o.         Ellen Dillon was seen in the COAT Clinic. This clinic visit included medication management, addiction education, and evaluation of progress in recovery.      Subjective:     Intake: Prescribed opioids at age 41 with Lorcets 10's and then went to Percocet 5's, and then one day at age 44 her doc said he wasn't going to prescribe them any more.  Bought them off the street for a long time(mother helped her buy them).  Then became pregnant and wasn't using during that time.  Ended up having a miscarriage.  Went with [facility] for 6 years, but go discharged for missing a random.  Went to [facility] for a month, but didn't like the group and the doc, and the tele visit.  Allergic to the films.  Tablets do fine for her.  Left there in September 2021, and then went to bridge program and went to [place] from Oct until 2 weeks ago.  She states that  They told her that another patient had told them she had offered them drugs, which she states she hasn't.  Raped at age 88, and states that she doesn't think about it.  Used to get counseling for that, but no longer needs it.     1/27: Patient has 7 days sober. Denies c, wd, se.  Did not go to any meetings.  Doing well.  No new concerns.     2/3: Patient has 14 days sober.  Denies c, wd, se. 2 podcasts and no meetings. No concerns.     2/10: Patient has 2 days sober.   Denies c, wd, se.  Went to [facility].  She denied use last week, but when she came to screen she admitted to staff that she had used.  Stated she wasn't comfortable revealing this to the group.  Will explicitly states that she needs to call and let us know before group or during group of any relapses.      2/17: Patient has 2 days sober, smoked marijuana.  Denies c, wd, se.  Went to 2  meetings.  Had a bad week.     2/24: Patient has 7 days sober, but then revealed that she had a couple Mike[employer] Hard Lemonades yesterday.  She states she was unaware that she wasn't allowed to drink.  We updated her on our policies.  We were also able to confirm that she did call us last week to reschedule her individual therapy appointment.  She was given the wrong number, and ultimately we did not get the message.  We updated her with the correct number.     3/3: Patient has 1 day sober.  Used cocaine yesterday.  Denies c, wd, se.     3/10: States she has had 4 days sober and used alcohol only.  Went to [employer] [facility] and is 8 behind.  Denies c, wd, se.  Requesting to start hydroxyzine.      3/17:  2 days sober, used cocaine and alcohol.  States she went on a binger because of her friend passing away from an overdose.  Went to 1.5 meetings.  Now 11 behind.  Denies wd, or se.  Denies cravings.  Would like to see  Belinda soon.     3/24: 4 days sober.  Used cocaine.  Went to [employer] and is 12 behind.  Denies c, wd, se.  Daughter and mother are both sick with the GI bug.       3/31: 11 days sober from opioids.  She stated that she has had alcohol last night.  She stated that she attended 3 meetings.  Currently denied any cravings for opioids.  Denied any side effects from Suboxone.    4/7: 1 day sober, used cocaine.  Went to [employer] and is still 9 behind.  Denies any c, wd, se.  States she got a new job and might get overloaded, but it will get her out of her house and one step towards independence because it is right behind her house and so transportation isn't an issue.     4/14: 7 days sober.  Went to 2 meetings, and is 11 behind.  Started her new job.  Denies c, wd, se.  New job is a lot, but a good thing. Patient NC/NS her random and an individual therapy.  Will be on intensive for the next two weeks. Discussed her continued use of cocaine and EtOH, and she was very dismissive.  She stated this program was for  opioids, not everything else.  Also stated you all don't give any meds for anything else.  I assured her there are no medications for other drug use.  She also stated she is 40 years old and if she wants a glass of wine, she should be allowed to have one.  She felt she was targeted, because other people don't show up for group sometimes.  We assured her that people who NC/NS will have penalties, but we impose them privately and not within the entire group.  She has very poor insight, and is not taking accountability for herself.     4/21:  Patient states that she has a family emergency.  Her ex-boyfriend was arrested and she has to go get him.  She is very upset that she has to tell me while also telling the whole group.  She insists that she can't be in group, and requests to come tomorrow for group.  She states she doesn't need to bail him out, but she has to go get him.  She does not have a vehicle or license.  Gave her a script and told her to come to group tomorrow at 10 am.      4/22:  Make up group today.  States the stress is the same today, and used last Friday.  A lot of cocaine.  She states that the rest is "private".  Went to [employer].  Having some cravings, but denies wd, se.      4/28: States she has 3 days sober, used cocaine.  Went to [employer] and has had cravings for cocaine due to the stress in her house. She is considering getting a license and getting her own place to live.  First and foremost she wants to have a meeting with me and her mother to discuss what's going on.  She has completed all of her intensive requirements.       5/5: States she has 5 days sober, used cocaine.  Went to [employer] and is 6 behind. Positive for cravings, but denies wd, se. Stressed currently because she states she told her mom the truth and voluntarily left the house. Her and Callie are staying with  Callie's father.  She is requesting transfer to [facility] because it is close to where she is staying.    5/12:  4  days sober, used cocaine on Sunday.  States also took over the counter sleeping medicine.  Went to 6 meetings and states it's been helpful.  Still 2 behind on meetings. Denies c, wd, se.        Current MAT dose: 16 mg Daily    Objective:  Video Visit.  Patient is in no acute distress, alert and oriented, and dressed appropriately.  Speech is clear and coherent.  Thought process is linear.  No indication of responding to internal stimuli.      Urine drug screen review:  Intake: 1/20: Bup, Coc, EtOH  2/3: Bup, Coc, EtOH  2/11: Bup, Coc, EtOH  2/24: Bup, Coc, EtOH  3/3: Bup, Coc, EtOH  3/17: Bup, Coc, EtOH  4/7: Bup, Coc, EtOH  4/14: Bup, Coc, EtOH  4/15: Bup, Coc, EtOH  4/18: Bup, coc, EtOH  4/22: Bup, Coc  4/28: Bup, Coc, EtOH (Admitted to use)    BOP Review Date: 03/31/21    Lab Review:   Hepatitis A Neg  Hepatitis B Neg  Hepatitis C Neg  HIV  Neg  EtOH None Detected  LFTs Unremarkable      Diagnoses and all orders for this visit:    Opioid use disorder, severe, in early remission, on maintenance therapy (CMS HCC)  -     buprenorphine-naloxone (SUBOXONE) 8-2 mg Sublingual Tablet, Sublingual; Place 2 Tablets under the tongue Once a day        Continue current dose of Suboxone and RTO in 7 days for COAT.    Arlyn Dunning, MD  05/05/2021, 13:51    TELEMEDICINE DOCUMENTATION:    Patient Location:  MyChart video visit from the office today  Patient/family aware of provider location:  yes  Patient/family consent for telemedicine:  yes  Examination observed and performed by:  Arlyn Dunning, MD

## 2021-05-05 NOTE — Nursing Note (Signed)
Patient presents for a random urine drug screen. Patient admits to cocaine use. Patient positive for BUP and COC. Patient was observed. Will send out for levels and confirmation.     Huriel Matt, MA  05/05/2021, 15:04

## 2021-05-12 ENCOUNTER — Telehealth: Payer: Medicaid Other | Admitting: Counselor - Mental Health Counselor

## 2021-05-12 ENCOUNTER — Encounter (INDEPENDENT_AMBULATORY_CARE_PROVIDER_SITE_OTHER): Payer: Self-pay

## 2021-05-12 ENCOUNTER — Other Ambulatory Visit: Payer: Self-pay

## 2021-05-12 ENCOUNTER — Ambulatory Visit (INDEPENDENT_AMBULATORY_CARE_PROVIDER_SITE_OTHER): Payer: Medicaid Other

## 2021-05-12 ENCOUNTER — Telehealth: Payer: Medicaid Other | Admitting: FAMILY PRACTICE

## 2021-05-12 ENCOUNTER — Encounter (INDEPENDENT_AMBULATORY_CARE_PROVIDER_SITE_OTHER): Payer: Self-pay | Admitting: FAMILY PRACTICE

## 2021-05-12 DIAGNOSIS — F1120 Opioid dependence, uncomplicated: Secondary | ICD-10-CM

## 2021-05-12 DIAGNOSIS — F1190 Opioid use, unspecified, uncomplicated: Secondary | ICD-10-CM

## 2021-05-12 DIAGNOSIS — Z79899 Other long term (current) drug therapy: Secondary | ICD-10-CM

## 2021-05-12 DIAGNOSIS — F1121 Opioid dependence, in remission: Secondary | ICD-10-CM

## 2021-05-12 MED ORDER — BUPRENORPHINE 8 MG-NALOXONE 2 MG SUBLINGUAL TABLET
2.0000 | SUBLINGUAL_TABLET | Freq: Every day | SUBLINGUAL | 0 refills | Status: DC
Start: 2021-05-12 — End: 2021-05-19

## 2021-05-12 NOTE — Progress Notes (Signed)
BEHAVIORAL MEDICINE, [facility]   [address]  [place] [address]     Outpatient COAT Progress Note    Name: Ellen Dillon MRN:  [id number]   Date: 05/12/2021 Age: 40 y.o.         Ellen Dillon was seen in the COAT Clinic. This clinic visit included medication management, addiction education, and evaluation of progress in recovery.      Subjective:     Intake: Prescribed opioids at age 34 with Lorcets 10's and then went to Percocet 5's, and then one day at age 48 her doc said he wasn't going to prescribe them any more.  Bought them off the street for a long time(mother helped her buy them).  Then became pregnant and wasn't using during that time.  Ended up having a miscarriage.  Went with [facility] for 6 years, but go discharged for missing a random.  Went to [facility] for a month, but didn't like the group and the doc, and the tele visit.  Allergic to the films.  Tablets do fine for her.  Left there in September 2021, and then went to bridge program and went to [place] from Oct until 2 weeks ago.  She states that  They told her that another patient had told them she had offered them drugs, which she states she hasn't.  Raped at age 42, and states that she doesn't think about it.  Used to get counseling for that, but no longer needs it.     1/27: Patient has 7 days sober. Denies c, wd, se.  Did not go to any meetings.  Doing well.  No new concerns.     2/3: Patient has 14 days sober.  Denies c, wd, se. 2 podcasts and no meetings. No concerns.     2/10: Patient has 2 days sober.   Denies c, wd, se.  Went to [employer] [facility].  She denied use last week, but when she came to screen she admitted to staff that she had used.  Stated she wasn't comfortable revealing this to the group.  Will explicitly states that she needs to call and let us know before group or during group of any relapses.      2/17: Patient has 2 days sober, smoked marijuana.  Denies c, wd, se.  Went to 2  meetings.  Had a bad week.     2/24: Patient has 7 days sober, but then revealed that she had a couple Mike[employer] Hard Lemonades yesterday.  She states she was unaware that she wasn't allowed to drink.  We updated her on our policies.  We were also able to confirm that she did call us last week to reschedule her individual therapy appointment.  She was given the wrong number, and ultimately we did not get the message.  We updated her with the correct number.     3/3: Patient has 1 day sober.  Used cocaine yesterday.  Denies c, wd, se.     3/10: States she has had 4 days sober and used alcohol only.  Went to [employer] and is 8 behind.  Denies c, wd, se.  Requesting to start hydroxyzine.      3/17:  2 days sober, used cocaine and alcohol.  States she went on a binger because of her friend passing away from an overdose.  Went to 1.5 meetings.  Now 11 behind.  Denies wd, or se.  Denies cravings.  Would like to see  Belinda soon.     3/24: 4 days sober.  Used cocaine.  Went to [employer] and is 12 behind.  Denies c, wd, se.  Daughter and mother are both sick with the GI bug.       3/31: 11 days sober from opioids.  She stated that she has had alcohol last night.  She stated that she attended 3 meetings.  Currently denied any cravings for opioids.  Denied any side effects from Suboxone.    4/7: 1 day sober, used cocaine.  Went to [employer] and is still 9 behind.  Denies any c, wd, se.  States she got a new job and might get overloaded, but it will get her out of her house and one step towards independence because it is right behind her house and so transportation isn't an issue.     4/14: 7 days sober.  Went to 2 meetings, and is 11 behind.  Started her new job.  Denies c, wd, se.  New job is a lot, but a good thing. Patient NC/NS her random and an individual therapy.  Will be on intensive for the next two weeks. Discussed her continued use of cocaine and EtOH, and she was very dismissive.  She stated this program was for  opioids, not everything else.  Also stated you all don't give any meds for anything else.  I assured her there are no medications for other drug use.  She also stated she is 40 years old and if she wants a glass of wine, she should be allowed to have one.  She felt she was targeted, because other people don't show up for group sometimes.  We assured her that people who NC/NS will have penalties, but we impose them privately and not within the entire group.  She has very poor insight, and is not taking accountability for herself.     4/21:  Patient states that she has a family emergency.  Her ex-boyfriend was arrested and she has to go get him.  She is very upset that she has to tell me while also telling the whole group.  She insists that she can't be in group, and requests to come tomorrow for group.  She states she doesn't need to bail him out, but she has to go get him.  She does not have a vehicle or license.  Gave her a script and told her to come to group tomorrow at 10 am.      4/22:  Make up group today.  States the stress is the same today, and used last Friday.  A lot of cocaine.  She states that the rest is "private".  Went to [employer].  Having some cravings, but denies wd, se.      4/28: States she has 3 days sober, used cocaine.  Went to [employer] and has had cravings for cocaine due to the stress in her house. She is considering getting a license and getting her own place to live.  First and foremost she wants to have a meeting with me and her mother to discuss what's going on.  She has completed all of her intensive requirements.       5/5: States she has 5 days sober, used cocaine.  Went to [employer] and is 6 behind. Positive for cravings, but denies wd, se. Stressed currently because she states she told her mom the truth and voluntarily left the house. Her and Callie are staying with  Callie's father.  She is requesting transfer to [facility] because it is close to where she is staying.    5/12:  4  days sober, used cocaine on Sunday.  States also took over the counter sleeping medicine.  Went to 6 meetings and states it's been helpful.  Still 2 behind on meetings. Denies c, wd, se.      5/19: 11 days sober, and feels good about that.  Went to [employer] and is 2 behind.  Has had some cravings, but no WD, SE.  Must stay sober for 1 month or she will go to rehab vs [facility].  Told this on 5/12, so she has through until 6/9.      Current MAT dose: 16 mg Daily    Objective:  Video Visit.  Patient is in no acute distress, alert and oriented, and dressed appropriately.  Speech is clear and coherent.  Thought process is linear.  No indication of responding to internal stimuli.      Urine drug screen review:  Intake: 1/20: Bup, Coc, EtOH  2/3: Bup, Coc, EtOH  2/11: Bup, Coc, EtOH  2/24: Bup, Coc, EtOH  3/3: Bup, Coc, EtOH  3/17: Bup, Coc, EtOH  4/7: Bup, Coc, EtOH  4/14: Bup, Coc, EtOH  4/15: Bup, Coc, EtOH  4/18: Bup, coc, EtOH  4/22: Bup, Coc  4/28: Bup, Coc, EtOH (Admitted to use)  5/12: Bup, Coc, EtOH    BOP Review Date: 03/31/21    Lab Review:   Hepatitis A Neg  Hepatitis B Neg  Hepatitis C Neg  HIV  Neg  EtOH None Detected  LFTs Unremarkable      Diagnoses and all orders for this visit:    Opioid use disorder, severe, in early remission, on maintenance therapy (CMS HCC)  -     buprenorphine-naloxone (SUBOXONE) 8-2 mg Sublingual Tablet, Sublingual; Place 2 Tablets under the tongue Once a day        Continue current dose of Suboxone and RTO in 7 days for COAT.    Arlyn Dunning, MD  05/12/2021, 13:55    TELEMEDICINE DOCUMENTATION:    Patient Location:  MyChart video visit from the office today  Patient/family aware of provider location:  yes  Patient/family consent for telemedicine:  yes  Examination observed and performed by:  Arlyn Dunning, MD

## 2021-05-12 NOTE — Nursing Note (Signed)
Pt came in and was pulled for a random urine drug screen. Pt denies relapse or drug use. Pt positive for BUP and COC but pt states COC levels should be lower than last screen because it is coming out. Will send for levels and confirmation, Pt observed.    Juleen Starr, MA  05/12/2021, 13:48

## 2021-05-12 NOTE — Progress Notes (Signed)
[place] [facility]AV[facility] MEDICINE AND PSYCHIATRY  OPERATED BY [facility] MEDICINE AND [facility] PHYSICIANS    [facility] - [address]  [address]  [place], [place] [address]  [phone]      Group Therapy Progress Note      TELEMEDICINE DOCUMENTATION:    Patient Location:  MyChart video visit from [facility]    Patient/family aware of provider location:  yes  Patient/family consent for telemedicine:  yes  Examination observed and performed by:  Mathis Fare, LPC        [name]  Date of Service:  05/12/2021  TIME:  1440-1535  DURATION:  55 min  NUMBER IN GROUP:  9          CHIEF COMPLAINT:  Group Therapy CPT 90853 Addiction Problem on COAT Maintenance Program.        Subjective:  Ahlana is seen for virtual group therapy and is participating in a personal recovery program, which includes attending this Comprehensive Opioid Addiction Treatment Program.  Georgeanne reports no relapses since last group session.  Teja reports attending 4 support recovery meetings for the week.   Kalanie reports maintaining recovery and being compliant with medications.     Group began with individual check ins and introductions as needed.    Review of Last week's Group Topic- Instant gratification    Group Topic- Values in Recovery.  Psychoeducation was provided defining values as deep intentions for how people want to live their lives.  Further, we explored how values impact how we set goals in recovery and find life satisfaction.  Our personal values can have a direct influence on our behavior, thoughts, feelings, choices, and ultimately recovery.  Explored personal values in the areas of physical well-being, community, spirituality, hobbies, personal growth, family, intimate relationships, mental/emotional health, and employment/career.  Group members identified their specific values in each category and explored how this impacted their sobriety.    Keela reports that she is looking forward to  warmer weather and would like to pray more to further her recovery.    Homework Assigned- Implement personally identified way to use values to further recovery and wellness.    Observation:  Mood: within normal limits  Affect: consistent with mood      Thought process: within normal limits  Thought Content:  Within normal limits  Interpersonal:  Discussed issues, Attentive, Displayed insight, Provided feedback and Disruptive at times with peer side conversations.  Participation: Full  Appearance: dressed appropriately and well groomed      Assessment:     ICD-10-CM    1. Opioid use disorder, severe, on maintenance therapy, dependence (CMS HCC)  F11.20          Procedure:  82423 -  Patient attended COAT Group. This is a weekly process group designed to be appropriate for individuals with a substance use disorder.  The focus of this group was to provide supportive therapy, establish mutual support, and provide psycho-education for the biological aspects of addiction, recovery, and the efficacy of  the development of a recovery plan.   Encouragement of attendance of recovery meetings, identification of triggers for use/relapse and the development of a supportive social network were also covered.  Therapist utilized Motivational Interviewing, Cognitive Behavioral Therapy and person centered strategies to provide evidence based and supportive therapy. Therapist facilitated group support and discussion to include relapse prevention, building a sound recovery program, and highlight the practice of new recovery tools.       Treatment  Plan:    Abstinence    Medication compliance   Increase effective coping skills   Decrease stress   Participate in weekly group therapy   Participate in monthly OP individual therapy   Open communication and honesty    Follow recommendations of treatment         Mathis Fare, LPC  05/12/2021, 15:55

## 2021-05-13 ENCOUNTER — Ambulatory Visit (INDEPENDENT_AMBULATORY_CARE_PROVIDER_SITE_OTHER): Payer: Medicaid Other | Admitting: Counselor - Mental Health Counselor

## 2021-05-13 DIAGNOSIS — F1420 Cocaine dependence, uncomplicated: Secondary | ICD-10-CM

## 2021-05-13 DIAGNOSIS — F1120 Opioid dependence, uncomplicated: Secondary | ICD-10-CM

## 2021-05-19 ENCOUNTER — Telehealth: Payer: Medicaid Other | Admitting: Counselor - Mental Health Counselor

## 2021-05-19 ENCOUNTER — Other Ambulatory Visit: Payer: Self-pay

## 2021-05-19 ENCOUNTER — Encounter (INDEPENDENT_AMBULATORY_CARE_PROVIDER_SITE_OTHER): Payer: Self-pay | Admitting: FAMILY PRACTICE

## 2021-05-19 ENCOUNTER — Ambulatory Visit (INDEPENDENT_AMBULATORY_CARE_PROVIDER_SITE_OTHER): Payer: Medicaid Other

## 2021-05-19 ENCOUNTER — Telehealth: Payer: Medicaid Other | Admitting: FAMILY PRACTICE

## 2021-05-19 DIAGNOSIS — Z79899 Other long term (current) drug therapy: Secondary | ICD-10-CM

## 2021-05-19 DIAGNOSIS — F1120 Opioid dependence, uncomplicated: Secondary | ICD-10-CM

## 2021-05-19 DIAGNOSIS — F1190 Opioid use, unspecified, uncomplicated: Secondary | ICD-10-CM

## 2021-05-19 DIAGNOSIS — F1121 Opioid dependence, in remission: Secondary | ICD-10-CM

## 2021-05-19 MED ORDER — BUPRENORPHINE 8 MG-NALOXONE 2 MG SUBLINGUAL TABLET
2.0000 | SUBLINGUAL_TABLET | Freq: Every day | SUBLINGUAL | 0 refills | Status: DC
Start: 2021-05-19 — End: 2021-05-26
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
## 2021-05-19 NOTE — Nursing Note (Signed)
Patient presents for Group. I pulled patient for random urine drug screen. Patient admits to cocaine use on 05/17/2021. Patient positive for BUP and COC. Patient was observed. Will send out for levels and confirmation.     Cerena Baine, MA  05/19/2021, 13:07

## 2021-05-19 NOTE — Progress Notes (Signed)
[place] [facility]HAV[facility]AL MEDICINE AND PSYCHIATRY  OPERATED BY [facility] MEDICINE AND [facility] PHYSICIANS    [facility] - [address]  [address]  [place], [place] [address]  [phone]      Group Therapy Progress Note      TELEMEDICINE DOCUMENTATION:    Patient Location:  MyChart video visit from [facility]    Patient/family aware of provider location:  yes  Patient/family consent for telemedicine:  yes  Examination observed and performed by:  Mathis Fare, LPC        [name]  Date of Service:  05/19/2021  TIME:  1431-1535  DURATION:  64 min  NUMBER IN GROUP:  9      CHIEF COMPLAINT:  Group Therapy CPT 90853 Addiction Problem on COAT Maintenance Program.      Subjective:  Ellen Dillon is seen for virtual group therapy and is participating in a personal recovery program, which includes attending this Comprehensive Opioid Addiction Treatment Program.  Ellen Dillon reports a relapse on cocaine since last group session.  Ellen Dillon reports attending 4 support recovery meetings for the week.      Group began with individual check ins and introductions as needed.  A new group member was welcomed.    Review of Last week's Group Topic- Values in Recovery.    Group Topic- Coping Skills.  Group members were invited to participate in a Teletherapy Scavenger Hunt.  The activity assisted in identifying readily available tools to assist with distress tolerance, emotion regulation, and ultimately aid in relapse prevention.  Explored overconfidence in recovery.  The group provided personal examples of how overconfidence can impact recovery and members agreed it was largely negative by increasing risk of relapse.  Identified how maintaining humility or a teachable attitude can ultimately aid in recovery.    Members were asked to identify something that they would like to continue to learn in their recovery.  Ellen Dillon states she would like to work on staying away from cocaine.    Homework Assigned- None.   Group members will be transitioning to another therapist beginning next session.        Observation:  Mood: within normal limits  Affect: stable      Thought process: within normal limits  Thought Content:  Within normal limits  Interpersonal:  Discussed issues, Attentive and Provided feedback  Participation: Full  Appearance: dressed appropriately and well groomed      Assessment:     ICD-10-CM    1. Opioid use disorder, severe, on maintenance therapy, dependence (CMS HCC)  F11.20          Procedure:  16384 -  Patient attended COAT Group. This is a weekly process group designed to be appropriate for individuals with a substance use disorder.  The focus of this group was to provide supportive therapy, establish mutual support, and provide psycho-education for the biological aspects of addiction, recovery, and the efficacy of  the development of a recovery plan.   Encouragement of attendance of recovery meetings, identification of triggers for use/relapse and the development of a supportive social network were also covered.  Therapist utilized Motivational Interviewing, Cognitive Behavioral Therapy and person centered strategies to provide evidence based and supportive therapy. Therapist facilitated group support and discussion to include relapse prevention, building a sound recovery program, and highlight the practice of new recovery tools.       Treatment Plan:    Abstinence    Medication compliance   Increase effective coping skills  Decrease stress   Participate in weekly group therapy   Participate in monthly OP individual therapy   Open communication and honesty    Follow recommendations of treatment         Mathis Fare, LPC  05/19/2021, 15:25

## 2021-05-25 ENCOUNTER — Encounter (INDEPENDENT_AMBULATORY_CARE_PROVIDER_SITE_OTHER): Payer: Self-pay

## 2021-05-25 ENCOUNTER — Telehealth (INDEPENDENT_AMBULATORY_CARE_PROVIDER_SITE_OTHER): Payer: Self-pay

## 2021-05-25 NOTE — Telephone Encounter (Signed)
Attempted to contact pt for a random urine drug screen for 6.2. Unable to leave a voicemail as mailbox is not set up. Will send MyChart message.

## 2021-05-26 ENCOUNTER — Encounter (INDEPENDENT_AMBULATORY_CARE_PROVIDER_SITE_OTHER): Payer: Self-pay | Admitting: FAMILY PRACTICE

## 2021-05-26 ENCOUNTER — Other Ambulatory Visit (INDEPENDENT_AMBULATORY_CARE_PROVIDER_SITE_OTHER): Payer: Self-pay | Admitting: FAMILY PRACTICE

## 2021-05-26 ENCOUNTER — Ambulatory Visit (INDEPENDENT_AMBULATORY_CARE_PROVIDER_SITE_OTHER): Payer: Self-pay | Admitting: Counselor - Mental Health Counselor

## 2021-05-26 ENCOUNTER — Other Ambulatory Visit: Payer: Self-pay

## 2021-05-26 ENCOUNTER — Telehealth (INDEPENDENT_AMBULATORY_CARE_PROVIDER_SITE_OTHER): Payer: Self-pay

## 2021-05-26 ENCOUNTER — Ambulatory Visit (INDEPENDENT_AMBULATORY_CARE_PROVIDER_SITE_OTHER): Payer: Medicaid Other

## 2021-05-26 ENCOUNTER — Ambulatory Visit (INDEPENDENT_AMBULATORY_CARE_PROVIDER_SITE_OTHER): Payer: Self-pay | Admitting: FAMILY PRACTICE

## 2021-05-26 DIAGNOSIS — F1121 Opioid dependence, in remission: Secondary | ICD-10-CM

## 2021-05-26 DIAGNOSIS — F1190 Opioid use, unspecified, uncomplicated: Secondary | ICD-10-CM

## 2021-05-26 NOTE — Nursing Note (Signed)
Patient presents for random urine drug screen. Patient denies relapse, admits to COC use. Patient was positive for BUP and COC. Patient was observed. Will send out for levels and confirmation.     Dorleen Kissel, MA  05/26/2021, 11:57

## 2021-05-26 NOTE — Telephone Encounter (Signed)
Pt contacted office regarding group. Pt stated that she was sick and asked if her prescription could be sent in. Pt was informed that because she is on intensive, she is not able to receive a prescription until she screens. Pt then asked if she should be tested for COVID first but she was instructed to come screen. Pt then stated that she was worried about being around other people. Pt was asked to wear a mask when she comes into the office. Pt confirmed.

## 2021-05-26 NOTE — Telephone Encounter (Signed)
6/2: Patient screened and is pos for Bup, Coc.  Feels sick and is excused from group.  Will get tested for Covid and let us know. Script sent x 1 week.    Arlyn Dunning, MD  05/26/2021, 14:09

## 2021-05-27 ENCOUNTER — Encounter (INDEPENDENT_AMBULATORY_CARE_PROVIDER_SITE_OTHER): Payer: Self-pay | Admitting: Counselor - Mental Health Counselor

## 2021-05-31 ENCOUNTER — Ambulatory Visit (INDEPENDENT_AMBULATORY_CARE_PROVIDER_SITE_OTHER): Payer: Medicaid Other | Admitting: Student in an Organized Health Care Education/Training Program

## 2021-05-31 ENCOUNTER — Ambulatory Visit
Payer: Medicaid Other | Attending: Student in an Organized Health Care Education/Training Program | Admitting: Student in an Organized Health Care Education/Training Program

## 2021-05-31 ENCOUNTER — Other Ambulatory Visit: Payer: Self-pay

## 2021-05-31 ENCOUNTER — Encounter (INDEPENDENT_AMBULATORY_CARE_PROVIDER_SITE_OTHER): Payer: Self-pay | Admitting: Student in an Organized Health Care Education/Training Program

## 2021-05-31 VITALS — BP 146/89 | HR 54 | Temp 97.5°F | Ht 68.0 in | Wt 185.0 lb

## 2021-05-31 DIAGNOSIS — F17210 Nicotine dependence, cigarettes, uncomplicated: Secondary | ICD-10-CM

## 2021-05-31 DIAGNOSIS — J3489 Other specified disorders of nose and nasal sinuses: Secondary | ICD-10-CM

## 2021-05-31 DIAGNOSIS — Z6828 Body mass index (BMI) 28.0-28.9, adult: Secondary | ICD-10-CM

## 2021-05-31 DIAGNOSIS — U071 COVID-19: Secondary | ICD-10-CM

## 2021-05-31 DIAGNOSIS — R630 Anorexia: Secondary | ICD-10-CM

## 2021-05-31 DIAGNOSIS — R059 Cough, unspecified: Secondary | ICD-10-CM

## 2021-05-31 DIAGNOSIS — R52 Pain, unspecified: Secondary | ICD-10-CM

## 2021-05-31 DIAGNOSIS — R110 Nausea: Secondary | ICD-10-CM

## 2021-05-31 DIAGNOSIS — J209 Acute bronchitis, unspecified: Secondary | ICD-10-CM

## 2021-05-31 DIAGNOSIS — R5383 Other fatigue: Secondary | ICD-10-CM

## 2021-05-31 MED ORDER — DOXYCYCLINE HYCLATE 100 MG CAPSULE
100.0000 mg | ORAL_CAPSULE | Freq: Two times a day (BID) | ORAL | 0 refills | Status: AC
Start: 2021-05-31 — End: 2021-06-10

## 2021-05-31 NOTE — Patient Instructions (Addendum)
[address], [place]  [address]  [place] [address]  Phone: [phone]  Fax: [phone]           Open Daily 8:00am - 8:00pm, except Sundays 12pm-8pm         ~ Closed Thanksgiving and Christmas Day     Attending Caregiver: Fredricka Bonine, MD    Today's orders:   Orders Placed This Encounter   . doxycycline hyclate (VIBRAMYCIN) 100 mg Oral Capsule        Prescription(s) E-Rx to:  [facility] [place]  - [address]    ________________________________________________________________________  Short Term Disability and Family Medical Leave Act  [facility] does NOT provide assistance with any disability applications.  If you feel your medical condition requires you to be on disability, you will need to follow up with  Your primary care physician or a specialist.  We apologize for any inconvenience.    For Medication Prescribed by [facility]:  As an Urgent Care facility, our clinic does NOT offer prescription refills over the telephone.    If you need more of the medication one of our medical providers prescribed, you will  Either need to be re-evaluated by [place] or see your primary care physician.    ________________________________________________________________________      It is very important that we have a phone number that is the single best way to contact you in the event that we become aware of important clinical information or concerns after your discharge.  If the phone number you provided at registration is NOT this number you should inform staff and registration prior to leaving.      Your treatment and evaluation today was focused on identifying and treating potentially emergent conditions based on your presenting signs, symptoms, and history.  The resulting initial clinical impression and treatment plan is not intended to be definitive or a substitute for a full physical examination and evaluation by your primary care provider.   If your symptoms persist, worsen, or you develop any new or concerning symptoms, you need to be evaluated.      If you received x-rays during your visit, be aware that the final and formal interpretation of those films by a radiologist may occur after your discharge.  If there is a significant discrepancy identified after your discharge, we will contact you at the telephone number provided at registration.      If you received a pelvic exam, you may have cultures pending for sexually transmitted diseases.  Positive cultures are reported to the [facility] Department of Health as required by state law.  You should be contacted if you cultures are positive.  We will not contact you if they are negative.  You did NOT receive a PAP smear (the screening test for cervical).  This specific test for women is best performed by your gynecologist or primary care provider when indicated.      If you are over 38 year old, we cannot discuss your personal health information with a parent, spouse, family member, or anyone else without your express consent.  This does not include those who have legitimate access to your records and information to assist in your care under the provisions of HIPAA [facility] Portability and Accountability Act) law, or those to whom you have previously given express written consent to do so, such a legal guardian or Power of [place].      You may have received medication that may  cause you to feel drowsy and/or light headed for several hours.  You may even experience some amnesia of your stay.  You should avoid operating a motor vehicle or performing any activity requiring complete alertness or coordination until you feel fully awake (approximately 24-48 hours).  Avoid alcoholic beverages.  You may also have a dry mouth for several hours.  This is a normal side effect and will disappear as the effects of the medication wear off.      Instructions discussed with patient upon discharge by clinical staff  with all questions answered.  Please call [facility] [phone] if any further questions.  Go immediately to the emergency department if any concern or worsening symptoms.    Fredricka Bonine, MD 05/31/2021, 14:50

## 2021-05-31 NOTE — Nursing Note (Signed)
BP (!) 146/89   Pulse 54   Temp 36.4 C (97.5 F) (Tympanic)   Ht 1.727 m (5\' 8" )   Wt 83.9 kg (185 lb)   LMP 05/29/2021 (Exact Date)   SpO2 97%   BMI 28.13 kg/m       07/29/2021, RN  05/31/2021, 13:29

## 2021-05-31 NOTE — Progress Notes (Signed)
[facility]  [facility]  [address]  [place] [address]  [phone]  Today's date: 05/31/21    Patient Name: Ellen Dillon  Patient DOB: Dec 24, 1981      SUBJECTIVE:   Morrie Sheldon Antionette Duggin is a 40 y.o. female who complains of cough, chest congestion, sinus drainage, rhinorrhea, nasal congestion, body aches, fatigue and fevers.  Symptoms started 10 days ago but fevers resolved after a few days.    Sick contacts: her father tested positive for COVID   Denies change/loss of taste or smell.  She denies a history of wheezing, shortness of breath, chest pain, vomiting and anorexia.   She denies a history of asthma.      PMH:  Current Outpatient Medications   Medication Sig   . buprenorphine-naloxone (SUBOXONE) 8-2 mg Sublingual Tablet, Sublingual Place 2 Tablets under the tongue Once a day   . doxycycline hyclate (VIBRAMYCIN) 100 mg Oral Capsule Take 1 Capsule (100 mg total) by mouth Twice daily for 10 days   . hydrOXYzine HCL (ATARAX) 25 mg Oral Tablet Take 1 Tablet (25 mg total) by mouth Every 8 hours as needed for Anxiety   . naloxone (NARCAN) 4 mg per spray nasal spray 1 Spray by INTRANASAL route Every 2 minutes as needed (for overdose)     Allergies   Allergen Reactions   . Penicillins      HIVES   . Propoxyphene      HIVES   . Sulfa (Sulfonamides)      HIVES   . Suboxone [Buprenorphine-Naloxone]  Other Adverse Reaction (Add comment)     **Only the strips**  Gives stroke level bp   . Tramadol Swelling     Past Medical History:   Diagnosis Date   . Anxiety    . Diet controlled White classification A1 gestational diabetes mellitus (GDM) 08/07/2019   . Gestational hypertension 09/01/2019   . Gestational hypertension, third trimester 08/07/2019   . High-risk pregnancy in third trimester 07/17/2019   . Hx of spontaneous abortion, currently pregnant, third trimester    . Hypertension     due to pregnancy   . Multigravida of advanced maternal age in  third trimester 07/17/2019   . 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    . 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 Overweight(278.02)    . Panic attack    . Seizure (CMS [facility])     age 64    . Tobacco smoking complicating pregnancy, third trimester 02/27/2019         Past Surgical History:   Procedure Laterality Date   . HX BREAST BIOPSY      x3 cyst    . HX CYST INCISION AND DRAINAGE  LEFT   . HX DILATION AND CURETTAGE  01/04/12     Dilation and Curettage suction          Family Medical History:     Problem Relation (Age of Onset)    Diabetes Maternal Grandmother, Maternal Grandfather    Healthy Father, Mother, Brother          Social History     Socioeconomic History   . Marital status: Single   . Number of children: 0   . Years of education: 12.5   Tobacco Use   . Smoking status: Current Every Day Smoker     Packs/day: 0.50     Years: 14.00     Pack years: 7.00  Types: Cigarettes   . Smokeless tobacco: Never Used   Substance and Sexual Activity   . Alcohol use: No   . Drug use: No     Types: Other     Comment: denies/history of opiate pills and cocaine   . Sexual activity: Yes     Partners: Male   Other Topics Concern   . Ability to Walk 2 Flight of Steps without SOB/CP Yes   . Ability To Do Own ADL's Yes     Social History     Tobacco Use   Smoking Status Current Every Day Smoker   . Packs/day: 0.50   . Years: 14.00   . Pack years: 7.00   . Types: Cigarettes   Smokeless Tobacco Never Used       OBJECTIVE:  BP (!) 146/89   Pulse 54   Temp 36.4 C (97.5 F) (Tympanic)   Ht 1.727 m (5\' 8" )   Wt 83.9 kg (185 lb)   LMP 05/29/2021 (Exact Date)   SpO2 97%   BMI 28.13 kg/m     Appearance: in no apparent distress, well developed and well nourished, non-toxic, in no respiratory distress and acyanotic and alert.   Skin:  Clean. Dry and warm to touch. No rash on arms or face  ENT: bilateral TM normal without fluid or infection, pharynx erythematous without exudate, post nasal drip noted and nasal  mucosa congested.   Cardiovascular:    Heart regular rate and rhythm  Pulmonary Lungs: non-labored breathing, clear to auscultation bilaterally.  No rales, wheezes or rhonchi appreciated.       ASSESSMENT/PLAN    ICD-10-CM    1. Acute bronchitis, unspecified organism  J20.9 doxycycline hyclate (VIBRAMYCIN) 100 mg Oral Capsule   2. Cough  R05.9    3. Fatigue, unspecified type  R53.83    4. Body aches  R52    5. Decreased appetite  R63.0    6. Rhinorrhea  J34.89    7. Nausea  R11.0    8. COVID-19  U07.1      Medication Orders   Medications   . doxycycline hyclate (VIBRAMYCIN) 100 mg Oral Capsule     Sig: Take 1 Capsule (100 mg total) by mouth Twice daily for 10 days     Dispense:  20 Capsule     Refill:  0     Acute bronchitis  Discussed may be viral in nature but given worsening symptoms with sputum production that is getting more viscous will cover with abx  Doxycycline 100 mg p.o. B.i.d. x10 days  (if taking vitamins then advised to take >4hrs apart and SE of photosensitivity reviewed)    COVID 19  Cepheid 4 Plex obtained (COVID, RSV, Flu A/B)    COVID positive   RSV and flu negative  Discussed that viral respiratory illness typically peak on day 3-5 before improvement is noted and run a course of 7-14 days.  Symptomatic therapy suggested: push fluids, rest, (vitamin C, vitamin D and zinc if appropriate reviewed), gargle warm salt water, use vaporizer or mist prn and use acetaminophen, cough suppressant of choice prn.   Call or return to clinic prn if these symptoms worsen or fail to improve as anticipated.  Concerning symptoms and red flags reviewed to f/u in the ER.    Portions of this note may be dictated using voice recognition software or a dictation service. Variances in spelling and vocabulary are possible and unintentional. Not all errors are caught/corrected. Please notify the  author if any discrepancies are noted or if the meaning of any statement is not clear.     Chanda Busing, MD

## 2021-06-01 ENCOUNTER — Telehealth (INDEPENDENT_AMBULATORY_CARE_PROVIDER_SITE_OTHER): Payer: Self-pay

## 2021-06-01 NOTE — Telephone Encounter (Signed)
Contacted pt regarding remaining in the program until next week due to her testing positive for COVID and unable to join her new program this week. Pt was informed that she would need to provided documentation showing that she has COVID. Pt was also informed that she would still be expected to participate in group tomorrow, virtually. Pt requested our fax number to have urgent care fax over her results. Pt was provided with the number. Pt stated that her stepfather tested positive for COVID first yesterday so she and the rest of her family went to urgent care and also tested positive for COVID.

## 2021-06-02 ENCOUNTER — Encounter (INDEPENDENT_AMBULATORY_CARE_PROVIDER_SITE_OTHER): Payer: Self-pay | Admitting: FAMILY PRACTICE

## 2021-06-02 ENCOUNTER — Encounter (INDEPENDENT_AMBULATORY_CARE_PROVIDER_SITE_OTHER): Payer: Self-pay

## 2021-06-02 ENCOUNTER — Telehealth: Payer: Medicaid Other | Admitting: FAMILY PRACTICE

## 2021-06-02 ENCOUNTER — Telehealth: Payer: Medicaid Other

## 2021-06-02 DIAGNOSIS — F1120 Opioid dependence, uncomplicated: Secondary | ICD-10-CM

## 2021-06-02 DIAGNOSIS — F1121 Opioid dependence, in remission: Secondary | ICD-10-CM

## 2021-06-02 DIAGNOSIS — Z79899 Other long term (current) drug therapy: Secondary | ICD-10-CM

## 2021-06-02 MED ORDER — BUPRENORPHINE 8 MG-NALOXONE 2 MG SUBLINGUAL TABLET
2.0000 | SUBLINGUAL_TABLET | Freq: Every day | SUBLINGUAL | 0 refills | Status: DC
Start: 2021-06-02 — End: 2021-06-09

## 2021-06-02 NOTE — Progress Notes (Signed)
[facility]  OPERATED BY [facility] MEDICINE AND [facility] PHYSICIANS    [facility] - [address]  [address]  [place], [place] [address]  [phone]      Group Therapy Progress Note      TELEMEDICINE DOCUMENTATION:    Patient Location:  MyChart video visit from home address: [address]  [place] [address]    Patient/family aware of provider location:  yes  Patient/family consent for telemedicine:  yes  Examination observed and performed by:  Anda Kraft, CRISIS WORKER        Ellen Dillon  Date of Service:  06/02/2021  TIME:  1330-1430  DURATION:  60 min  NUMBER IN GROUP:  8          CHIEF COMPLAINT:  Group Therapy CPT 67672 Addiction Problem on COAT Maintenance Program.  *Dr. Harvie Bridge is the Supervising Physician for the Group.      Subjective:  Ellen Dillon is seen for virtual group therapy and is participating in a personal recovery program, which includes attending this Comprehensive Opioid Addiction Treatment Program.  Ellen Dillon reports no relapses since last group session.  Ellen Dillon reports attending three support recovery meetings for the week.   Ellen Dillon reports maintaining recovery and being compliant with medications.     Group began with individual check ins and introductions as needed.    Group Topic-Feeling overwhelmed and finding balance.  The group discussed how taking on too much can cause them to feel overwhelmed, both mentally and physically.  Ellen Dillon said she can feel her temperature rise and her body gets hot.  The group identified how being overwhelmed impacts them.  The group identified what they do when they are feeling overwhelmed. Ellen Dillon said she often gets mad at others because they don't have as much to do as she does.  The group discussed strategies for reducing the feelings of being overwhelmed. Ellen Dillon said she can try not to take it out on others.    Homework Assigned- This week the individuals will try ways to reduce their daily  responsibilities or tasks so they can feel less overwhelmed.    Observation:  Mood: within normal limits  Affect: stable      Thought process: within normal limits  Thought Content:  Within normal limits  Interpersonal:  Discussed issues, Attentive, Displayed insight and Provided feedback  Participation: Full  Appearance: dressed appropriately, well groomed and no apparent distress      Assessment:     ICD-10-CM    1. Opioid use disorder, severe, on maintenance therapy, dependence (CMS HCC)  F11.20          Procedure:  09470 -  Patient attended COAT Group. This is a weekly process group designed to be appropriate for individuals with a substance use disorder.  The focus of this group was to provide supportive therapy, establish mutual support, and provide psycho-education for the biological aspects of addiction, recovery, and the efficacy of  the development of a recovery plan.   Encouragement of attendance of recovery meetings, identification of triggers for use/relapse and the development of a supportive social network were also covered.  Therapist utilized Motivational Interviewing, Cognitive Behavioral Therapy and person centered strategies to provide evidence based and supportive therapy. Therapist facilitated group support and discussion to include relapse prevention, building a sound recovery program, and highlight the practice of new recovery tools.       Treatment Plan:    Abstinence    Medication compliance  Increase effective coping skills   Decrease stress   Participate in weekly group therapy   Participate in monthly OP individual therapy   Open communication and honesty    Follow recommendations of treatment         Anda Kraft, CRISIS WORKER  06/02/2021, 16:03    I have reviewed the student counselor's note. I agree with the findings and plan of care as documented in the provisionally licensed counselor's note. Any exceptions/additions are noted below:    Bridgett Larsson, M.S., CTT, [facility],  ALPS  [facility] Licensed Professional Counselor # [id number]  [place] Licensed Financial controller # [phone]  [place] Licensed Professional Counselor # [phone]  [facility]: COAT Program

## 2021-06-02 NOTE — Progress Notes (Signed)
BEHAVIORAL MEDICINE, [facility]   [address]  [place] [address]     Outpatient COAT Progress Note    Name: Felicitas Sine MRN:  [id number]   Date: 06/02/2021 Age: 40 y.o.         Dariann Antionette Schone was seen in the COAT Clinic. This clinic visit included medication management, addiction education, and evaluation of progress in recovery.      Subjective:     Intake: Prescribed opioids at age 64 with Lorcets 10's and then went to Percocet 5's, and then one day at age 63 her doc said he wasn't going to prescribe them any more.  Bought them off the street for a long time(mother helped her buy them).  Then became pregnant and wasn't using during that time.  Ended up having a miscarriage.  Went with [facility] for 6 years, but go discharged for missing a random.  Went to [facility] for a month, but didn't like the group and the doc, and the tele visit.  Allergic to the films.  Tablets do fine for her.  Left there in September 2021, and then went to bridge program and went to [place] from Oct until 2 weeks ago.  She states that  They told her that another patient had told them she had offered them drugs, which she states she hasn't.  Raped at age 68, and states that she doesn't think about it.  Used to get counseling for that, but no longer needs it.     1/27: Patient has 7 days sober. Denies c, wd, se.  Did not go to any meetings.  Doing well.  No new concerns.     2/3: Patient has 14 days sober.  Denies c, wd, se. 2 podcasts and no meetings. No concerns.     2/10: Patient has 2 days sober.   Denies c, wd, se.  Went to [facility] [facility].  She denied use last week, but when she came to screen she admitted to staff that she had used.  Stated she wasn't comfortable revealing this to the group.  Will explicitly states that she needs to call and let us know before group or during group of any relapses.      2/17: Patient has 2 days sober, smoked marijuana.  Denies c, wd, se.  Went to 2 meetings.   Had a bad week.     2/24: Patient has 7 days sober, but then revealed that she had a couple Mike[employer] Hard Lemonades yesterday.  She states she was unaware that she wasn't allowed to drink.  We updated her on our policies.  We were also able to confirm that she did call us last week to reschedule her individual therapy appointment.  She was given the wrong number, and ultimately we did not get the message.  We updated her with the correct number.     3/3: Patient has 1 day sober.  Used cocaine yesterday.  Denies c, wd, se.     3/10: States she has had 4 days sober and used alcohol only.  Went to [employer] [facility] and is 8 behind.  Denies c, wd, se.  Requesting to start hydroxyzine.      3/17:  2 days sober, used cocaine and alcohol.  States she went on a binger because of her friend passing away from an overdose.  Went to 1.5 meetings.  Now 11 behind.  Denies wd, or se.  Denies cravings.  Would like to see  Belinda soon.     3/24: 4 days sober.  Used cocaine.  Went to [employer] and is 12 behind.  Denies c, wd, se.  Daughter and mother are both sick with the GI bug.       3/31: 11 days sober from opioids.  She stated that she has had alcohol last night.  She stated that she attended 3 meetings.  Currently denied any cravings for opioids.  Denied any side effects from Suboxone.    4/7: 1 day sober, used cocaine.  Went to [employer] and is still 9 behind.  Denies any c, wd, se.  States she got a new job and might get overloaded, but it will get her out of her house and one step towards independence because it is right behind her house and so transportation isn't an issue.     4/14: 7 days sober.  Went to 2 meetings, and is 11 behind.  Started her new job.  Denies c, wd, se.  New job is a lot, but a good thing. Patient NC/NS her random and an individual therapy.  Will be on intensive for the next two weeks. Discussed her continued use of cocaine and EtOH, and she was very dismissive.  She stated this program was for opioids, not  everything else.  Also stated you all don't give any meds for anything else.  I assured her there are no medications for other drug use.  She also stated she is 40 years old and if she wants a glass of wine, she should be allowed to have one.  She felt she was targeted, because other people don't show up for group sometimes.  We assured her that people who NC/NS will have penalties, but we impose them privately and not within the entire group.  She has very poor insight, and is not taking accountability for herself.     4/21:  Patient states that she has a family emergency.  Her ex-boyfriend was arrested and she has to go get him.  She is very upset that she has to tell me while also telling the whole group.  She insists that she can't be in group, and requests to come tomorrow for group.  She states she doesn't need to bail him out, but she has to go get him.  She does not have a vehicle or license.  Gave her a script and told her to come to group tomorrow at 10 am.      4/22:  Make up group today.  States the stress is the same today, and used last Friday.  A lot of cocaine.  She states that the rest is "private".  Went to [employer].  Having some cravings, but denies wd, se.      4/28: States she has 3 days sober, used cocaine.  Went to [employer] and has had cravings for cocaine due to the stress in her house. She is considering getting a license and getting her own place to live.  First and foremost she wants to have a meeting with me and her mother to discuss what's going on.  She has completed all of her intensive requirements.       5/5: States she has 5 days sober, used cocaine.  Went to [employer] and is 6 behind. Positive for cravings, but denies wd, se. Stressed currently because she states she told her mom the truth and voluntarily left the house. Her and Callie are staying with  Callie's father.  She is requesting transfer to [facility] because it is close to where she is staying.    5/12:  4 days sober, used  cocaine on Sunday.  States also took over the counter sleeping medicine.  Went to 6 meetings and states it's been helpful.  Still 2 behind on meetings. Denies c, wd, se.      5/19: 11 days sober, and feels good about that.  Went to [employer] and is 2 behind.  Has had some cravings, but no WD, SE.  Must stay sober for 1 month or she will go to rehab vs DC.  Told this on 5/12, so she has through until 6/9.    5/26: 2 days sober, used cocaine.  Went to [employer].  Denies c, wd, se.  States she used just a little on Tuesday, but hasn't used otherwise.  Her levels aren't going down though.  Her mother asked her to go buy cocaine for her, and the patient ended up using as well.       Current MAT dose: 16 mg Daily    Objective:  Video Visit.  Patient is in no acute distress, alert and oriented, and dressed appropriately.  Speech is clear and coherent.  Thought process is linear.  No indication of responding to internal stimuli.      Urine drug screen review:  Intake: 1/20: Bup, Coc, EtOH  2/3: Bup, Coc, EtOH  2/11: Bup, Coc, EtOH  2/24: Bup, Coc, EtOH  3/3: Bup, Coc, EtOH  3/17: Bup, Coc, EtOH  4/7: Bup, Coc, EtOH  4/14: Bup, Coc, EtOH  4/15: Bup, Coc, EtOH  4/18: Bup, coc, EtOH  4/22: Bup, Coc  4/28: Bup, Coc, EtOH (Admitted to use)  5/12: Bup, Coc, EtOH  5/19: Bup, Coc, EtOH  6/2: Bup, Coc, EtOH    BOP Review Date: 03/31/21    Lab Review:   Hepatitis A Neg  Hepatitis B Neg  Hepatitis C Neg  HIV  Neg  EtOH None Detected  LFTs Unremarkable      Diagnoses and all orders for this visit:    Opioid use disorder, severe, in early remission, on maintenance therapy (CMS HCC)  -     buprenorphine-naloxone (SUBOXONE) 8-2 mg Sublingual Tablet, Sublingual; Place 2 Tablets under the tongue Once a day        Continue current dose of Suboxone and RTO in 7 days for COAT.    Arlyn Dunning, MD  06/02/2021, 14:33    TELEMEDICINE DOCUMENTATION:    Patient Location:  MyChart video visit from the office today  Patient/family aware of  provider location:  yes  Patient/family consent for telemedicine:  yes  Examination observed and performed by:  Arlyn Dunning, MD

## 2021-06-04 LAB — POC COVID-19, FLU A/B, RSV RAPID BY PCR (RESULTS)
INFLUENZA VIRUS A, PCR 4PLEX, POC: NEGATIVE
INFLUENZA VIRUS B, PCR 4PLEX, POC: NEGATIVE
RSV, PCR 4PLEX, POC: NEGATIVE
SARS-COV-2, POC: POSITIVE — AB

## 2021-06-09 ENCOUNTER — Telehealth (INDEPENDENT_AMBULATORY_CARE_PROVIDER_SITE_OTHER): Payer: Medicaid Other | Admitting: FAMILY PRACTICE

## 2021-06-09 ENCOUNTER — Encounter (INDEPENDENT_AMBULATORY_CARE_PROVIDER_SITE_OTHER): Payer: Self-pay | Admitting: FAMILY PRACTICE

## 2021-06-09 DIAGNOSIS — Z79899 Other long term (current) drug therapy: Secondary | ICD-10-CM

## 2021-06-09 DIAGNOSIS — F1121 Opioid dependence, in remission: Secondary | ICD-10-CM

## 2021-06-09 MED ORDER — BUPRENORPHINE 8 MG-NALOXONE 2 MG SUBLINGUAL TABLET
2.0000 | SUBLINGUAL_TABLET | Freq: Every day | SUBLINGUAL | 0 refills | Status: DC
Start: 2021-06-09 — End: 2021-07-04

## 2021-06-09 MED ORDER — BUPRENORPHINE 8 MG-NALOXONE 2 MG SUBLINGUAL TABLET
2.0000 | SUBLINGUAL_TABLET | Freq: Every day | SUBLINGUAL | 0 refills | Status: DC
Start: 2021-06-09 — End: 2021-06-09

## 2021-06-09 MED ORDER — BUPRENORPHINE 8 MG-NALOXONE 2 MG SUBLINGUAL TABLET
2.0000 | SUBLINGUAL_TABLET | Freq: Every day | SUBLINGUAL | 0 refills | Status: DC
Start: 2021-06-16 — End: 2021-06-23

## 2021-06-09 NOTE — Progress Notes (Signed)
BEHAVIORAL MEDICINE, [facility]   [address]  [place] [address]     Outpatient COAT Progress Note    Name: Ellen Dillon MRN:  [id number]   Date: 06/09/2021 Age: 40 y.o.         Ellen Dillon was seen in the COAT Clinic. This clinic visit included medication management, addiction education, and evaluation of progress in recovery.      Subjective:     Intake: Prescribed opioids at age 87 with Lorcets 10's and then went to Percocet 5's, and then one day at age 59 her doc said he wasn't going to prescribe them any more.  Bought them off the street for a long time(mother helped her buy them).  Then became pregnant and wasn't using during that time.  Ended up having a miscarriage.  Went with [facility] for 6 years, but go discharged for missing a random.  Went to [facility] for a month, but didn't like the group and the doc, and the tele visit.  Allergic to the films.  Tablets do fine for her.  Left there in September 2021, and then went to bridge program and went to [facility] from Oct until 2 weeks ago.  She states that  They told her that another patient had told them she had offered them drugs, which she states she hasn't.  Raped at age 70, and states that she doesn't think about it.  Used to get counseling for that, but no longer needs it.     1/27: Patient has 7 days sober. Denies c, wd, se.  Did not go to any meetings.  Doing well.  No new concerns.     2/3: Patient has 14 days sober.  Denies c, wd, se. 2 podcasts and no meetings. No concerns.     2/10: Patient has 2 days sober.   Denies c, wd, se.  Went to [employer].  She denied use last week, but when she came to screen she admitted to staff that she had used.  Stated she wasn't comfortable revealing this to the group.  Will explicitly states that she needs to call and let us know before group or during group of any relapses.      2/17: Patient has 2 days sober, smoked marijuana.  Denies c, wd, se.  Went to 2  meetings.  Had a bad week.     2/24: Patient has 7 days sober, but then revealed that she had a couple Mike's Hard Lemonades yesterday.  She states she was unaware that she wasn't allowed to drink.  We updated her on our policies.  We were also able to confirm that she did call us last week to reschedule her individual therapy appointment.  She was given the wrong number, and ultimately we did not get the message.  We updated her with the correct number.     3/3: Patient has 1 day sober.  Used cocaine yesterday.  Denies c, wd, se.     3/10: States she has had 4 days sober and used alcohol only.  Went to [employer] and is 8 behind.  Denies c, wd, se.  Requesting to start hydroxyzine.      3/17:  2 days sober, used cocaine and alcohol.  States she went on a binger because of her friend passing away from an overdose.  Went to 1.5 meetings.  Now 11 behind.  Denies wd, or se.  Denies cravings.  Would like to see  Belinda soon.     3/24: 4 days sober.  Used cocaine.  Went to [employer] and is 12 behind.  Denies c, wd, se.  Daughter and mother are both sick with the GI bug.       3/31: 11 days sober from opioids.  She stated that she has had alcohol last night.  She stated that she attended 3 meetings.  Currently denied any cravings for opioids.  Denied any side effects from Suboxone.    4/7: 1 day sober, used cocaine.  Went to [employer] and is still 9 behind.  Denies any c, wd, se.  States she got a new job and might get overloaded, but it will get her out of her house and one step towards independence because it is right behind her house and so transportation isn't an issue.     4/14: 7 days sober.  Went to 2 meetings, and is 11 behind.  Started her new job.  Denies c, wd, se.  New job is a lot, but a good thing. Patient NC/NS her random and an individual therapy.  Will be on intensive for the next two weeks. Discussed her continued use of cocaine and EtOH, and she was very dismissive.  She stated this program was for  opioids, not everything else.  Also stated you all don't give any meds for anything else.  I assured her there are no medications for other drug use.  She also stated she is 40 years old and if she wants a glass of wine, she should be allowed to have one.  She felt she was targeted, because other people don't show up for group sometimes.  We assured her that people who NC/NS will have penalties, but we impose them privately and not within the entire group.  She has very poor insight, and is not taking accountability for herself.     4/21:  Patient states that she has a family emergency.  Her ex-boyfriend was arrested and she has to go get him.  She is very upset that she has to tell me while also telling the whole group.  She insists that she can't be in group, and requests to come tomorrow for group.  She states she doesn't need to bail him out, but she has to go get him.  She does not have a vehicle or license.  Gave her a script and told her to come to group tomorrow at 10 am.      4/22:  Make up group today.  States the stress is the same today, and used last Friday.  A lot of cocaine.  She states that the rest is "private".  Went to [employer].  Having some cravings, but denies wd, se.      4/28: States she has 3 days sober, used cocaine.  Went to [employer] and has had cravings for cocaine due to the stress in her house. She is considering getting a license and getting her own place to live.  First and foremost she wants to have a meeting with me and her mother to discuss what's going on.  She has completed all of her intensive requirements.       5/5: States she has 5 days sober, used cocaine.  Went to [employer] and is 6 behind. Positive for cravings, but denies wd, se. Stressed currently because she states she told her mom the truth and voluntarily left the house. Her and Callie are staying with  Callie's father.  She is requesting transfer to [facility] because it is close to where she is staying.    5/12:  4  days sober, used cocaine on Sunday.  States also took over the counter sleeping medicine.  Went to 6 meetings and states it's been helpful.  Still 2 behind on meetings. Denies c, wd, se.      5/19: 11 days sober, and feels good about that.  Went to [employer] and is 2 behind.  Has had some cravings, but no WD, SE.  Must stay sober for 1 month or she will go to rehab vs DC.  Told this on 5/12, so she has through until 6/9.    5/26: 2 days sober, used cocaine.  Went to [employer].  Denies c, wd, se.  States she used just a little on Tuesday, but hasn't used otherwise.  Her levels aren't going down though.  Her mother asked her to go buy cocaine for her, and the patient ended up using as well.     6/9: had 7 days sober, went to 3 meetings and denies c, wd, se. Started abx after being diagnosed with covid.  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     6/16:  States 6 days sober.  Went to [employer] and denies wd, se.  Has had some cravings due to a toothache. Has 6 years sober form opioids. Recurrently using cocaine and alcohol. Now her mom has covid and so she is in prolonged isolation.     Current MAT dose: 16 mg Daily    Objective:  Video Visit.  Patient is in no acute distress, alert and oriented, and dressed appropriately.  Speech is clear and coherent.  Thought process is linear.  No indication of responding to internal stimuli.      Urine drug screen review:  Intake: 1/20: Bup, Coc, EtOH  2/3: Bup, Coc, EtOH  2/11: Bup, Coc, EtOH  2/24: Bup, Coc, EtOH  3/3: Bup, Coc, EtOH  3/17: Bup, Coc, EtOH  4/7: Bup, Coc, EtOH  4/14: Bup, Coc, EtOH  4/15: Bup, Coc, EtOH  4/18: Bup, coc, EtOH  4/22: Bup, Coc  4/28: Bup, Coc, EtOH (Admitted to use)  5/12: Bup, Coc, EtOH  5/19: Bup, Coc, EtOH  6/2: Bup, Coc, EtOH    BOP Review Date: 03/31/21    Lab Review:   Hepatitis A Neg  Hepatitis B Neg  Hepatitis C Neg  HIV  Neg  EtOH None Detected  LFTs Unremarkable      Diagnoses and all orders for  this visit:    Opioid use disorder, severe, in early remission, on maintenance therapy (CMS HCC)  -     buprenorphine-naloxone (SUBOXONE) 8-2 mg Sublingual Tablet, Sublingual; Place 2 Tablets under the tongue Once a day  -     buprenorphine-naloxone (SUBOXONE) 8-2 mg Sublingual Tablet, Sublingual; Place 2 Tablets under the tongue Once a day        Continue current dose of Suboxone and RTO in 7 days for COAT, if she is not able to get into the other program before then.    Arlyn Dunning, MD  06/09/2021, 14:10      TELEMEDICINE DOCUMENTATION:    Patient Location:  MyChart video visit from the office today  Patient/family aware of provider location:  yes  Patient/family consent for telemedicine:  yes  Examination observed and performed by:  Arlyn Dunning, MD

## 2021-06-09 NOTE — Addendum Note (Signed)
Addended by: Kandis Fantasia on: 06/09/2021 02:57 PM     Modules accepted: Orders

## 2021-06-13 ENCOUNTER — Encounter (INDEPENDENT_AMBULATORY_CARE_PROVIDER_SITE_OTHER): Payer: Self-pay

## 2021-06-16 ENCOUNTER — Other Ambulatory Visit: Payer: Self-pay

## 2021-06-16 ENCOUNTER — Ambulatory Visit (INDEPENDENT_AMBULATORY_CARE_PROVIDER_SITE_OTHER): Payer: Medicaid Other

## 2021-06-16 ENCOUNTER — Encounter (INDEPENDENT_AMBULATORY_CARE_PROVIDER_SITE_OTHER): Payer: Self-pay

## 2021-06-16 VITALS — BP 130/66 | HR 84 | Temp 98.0°F | Ht 68.0 in | Wt 185.0 lb

## 2021-06-16 DIAGNOSIS — Z6828 Body mass index (BMI) 28.0-28.9, adult: Secondary | ICD-10-CM

## 2021-06-16 DIAGNOSIS — F1190 Opioid use, unspecified, uncomplicated: Secondary | ICD-10-CM

## 2021-06-16 DIAGNOSIS — F1120 Opioid dependence, uncomplicated: Secondary | ICD-10-CM

## 2021-06-16 NOTE — Nursing Note (Signed)
Patient was pulled for random urine drug screen. Patient admits to drug use, denies relapse. Patient positive for BUP and COC. Patient was observed. Will send out for levels and confirmation.     Ellen Casanas, MA  06/16/2021, 14:56
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
## 2021-06-23 ENCOUNTER — Ambulatory Visit (INDEPENDENT_AMBULATORY_CARE_PROVIDER_SITE_OTHER): Payer: Medicaid Other

## 2021-06-23 ENCOUNTER — Ambulatory Visit (INDEPENDENT_AMBULATORY_CARE_PROVIDER_SITE_OTHER): Payer: Medicaid Other | Admitting: FAMILY PRACTICE

## 2021-06-23 ENCOUNTER — Encounter (INDEPENDENT_AMBULATORY_CARE_PROVIDER_SITE_OTHER): Payer: Self-pay | Admitting: FAMILY PRACTICE

## 2021-06-23 DIAGNOSIS — F1121 Opioid dependence, in remission: Secondary | ICD-10-CM

## 2021-06-23 DIAGNOSIS — Z79899 Other long term (current) drug therapy: Secondary | ICD-10-CM

## 2021-06-23 DIAGNOSIS — F1120 Opioid dependence, uncomplicated: Secondary | ICD-10-CM

## 2021-06-23 MED ORDER — BUPRENORPHINE 8 MG-NALOXONE 2 MG SUBLINGUAL TABLET
2.0000 | SUBLINGUAL_TABLET | Freq: Every day | SUBLINGUAL | 0 refills | Status: AC
Start: 2021-06-23 — End: 2021-06-28
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
## 2021-06-23 NOTE — Progress Notes (Signed)
[facility]  OPERATED BY [facility] MEDICINE AND [facility] PHYSICIANS    [facility] - [address]  [address]  [place], [place] [address]  [phone]      Group Therapy Progress Note        Ellen Dillon  Date of Service:  06/23/2021  TIME:  1330-1430  DURATION:  60 min  NUMBER IN GROUP:  6          CHIEF COMPLAINT:  Group Therapy CPT 12458 Addiction Problem on COAT Maintenance Program.      Subjective:  Ellen Dillon is seen for virtual group therapy and is participating in a personal recovery program, which includes attending this Comprehensive Opioid Addiction Treatment Program.  Ellen Dillon reports relapsing on cocaine and marijuana since last group session.  Ellen Dillon reports attending two support recovery meetings for the week.   Ellen Dillon reports maintaining recovery and being compliant with medications.     Group began with individual check ins and introductions as needed.    Review of Last week's Group Topic- Self-care. Discussed with the group the homework assignment of completing one self care activity.      Group Topic- Independence day and freedom from addiction.  The group discussed what independence day means to them and what it means to their recovery. The group discussed how addiction confined them in their lives.  Group members identified ways they felt like they weren't free and had no control because of addiction.  The group discussed how sobriety freed them from their addiction that controlled them. Ellen Dillon said trust, support, and self-care are things that she has had improved in her life. The group discussed what they can do to have a healthy and sober Fourth of July celebration.    Homework Assigned- Have a sober holiday celebration.        Observation:  Mood: within normal limits  Affect: stable      Thought process: within normal limits  Thought Content:  Within normal limits  Interpersonal:  Discussed issues, Attentive, Displayed insight and Provided  feedback  Participation: Full  Appearance: casually dressed, dressed appropriately, well groomed and no apparent distress      Assessment:     ICD-10-CM    1. Opioid use disorder, severe, on maintenance therapy, dependence (CMS HCC)  F11.20          Procedure:  09983 -  Patient attended COAT Group. This is a weekly process group designed to be appropriate for individuals with a substance use disorder.  The focus of this group was to provide supportive therapy, establish mutual support, and provide psycho-education for the biological aspects of addiction, recovery, and the efficacy of  the development of a recovery plan.   Encouragement of attendance of recovery meetings, identification of triggers for use/relapse and the development of a supportive social network were also covered.  Therapist utilized Motivational Interviewing, Cognitive Behavioral Therapy and person centered strategies to provide evidence based and supportive therapy. Therapist facilitated group support and discussion to include relapse prevention, building a sound recovery program, and highlight the practice of new recovery tools.       Treatment Plan:    Abstinence    Medication compliance   Increase effective coping skills   Decrease stress   Participate in weekly group therapy   Participate in monthly OP individual therapy   Open communication and honesty    Follow recommendations of treatment         Anda Kraft,  CRISIS WORKER  06/23/2021, 16:57    I have reviewed thestudent counselor's note. I agree with the findings and plan of care as documented in the provisionally licensed counselor's note. Any exceptions/additions are noted below:    Bridgett Larsson, M.S., CTT, [facility], ALPS  [facility] Licensed Professional Counselor # [id number]  [place] Licensed Financial controller # [phone]  [place] Licensed Professional Counselor # [id number]  [facility]: COAT Program

## 2021-06-28 ENCOUNTER — Encounter (INDEPENDENT_AMBULATORY_CARE_PROVIDER_SITE_OTHER): Payer: Self-pay

## 2021-07-04 ENCOUNTER — Emergency Department
Admission: EM | Admit: 2021-07-04 | Discharge: 2021-07-04 | Disposition: A | Discharge status: Home / Self Care | Payer: Medicaid Other | Attending: Emergency Medicine | Admitting: Emergency Medicine

## 2021-07-04 DIAGNOSIS — F1490 Cocaine use, unspecified, uncomplicated: Secondary | ICD-10-CM

## 2021-07-04 DIAGNOSIS — F1120 Opioid dependence, uncomplicated: Secondary | ICD-10-CM

## 2021-07-04 DIAGNOSIS — F1193 Opioid use, unspecified with withdrawal: Secondary | ICD-10-CM

## 2021-07-04 DIAGNOSIS — F1123 Opioid dependence with withdrawal: Secondary | ICD-10-CM | POA: Insufficient documentation

## 2021-07-04 DIAGNOSIS — F17210 Nicotine dependence, cigarettes, uncomplicated: Secondary | ICD-10-CM | POA: Insufficient documentation

## 2021-07-04 DIAGNOSIS — F1410 Cocaine abuse, uncomplicated: Secondary | ICD-10-CM | POA: Insufficient documentation

## 2021-07-04 DIAGNOSIS — F1121 Opioid dependence, in remission: Secondary | ICD-10-CM

## 2021-07-04 LAB — MANUAL DIFF AND MORPHOLOGY-SYSMEX
BASOPHIL #: <0.1 10*3/uL (ref <=0.20)
BASOPHIL %: 0 %
EOSINOPHIL #: 0.17 10*3/uL (ref <=0.50)
EOSINOPHIL %: 2 %
LYMPHOCYTE #: 2.44 10*3/uL (ref 1.00–4.80)
LYMPHOCYTE %: 28 %
MONOCYTE #: 0.26 10*3/uL (ref 0.20–1.10)
MONOCYTE %: 3 %
NEUTROPHIL #: 5.83 10*3/uL (ref 1.50–7.70)
NEUTROPHIL %: 67 %

## 2021-07-04 LAB — DRUG SCREEN, NO CONFIRMATION, URINE
AMPHETAMINES, URINE: NEGATIVE
BARBITURATES URINE: NEGATIVE
BENZODIAZEPINES URINE: NEGATIVE
BUPRENORPHINE URINE: POSITIVE — AB
CANNABINOIDS URINE: NEGATIVE
COCAINE METABOLITES URINE: POSITIVE — AB
CREATININE RANDOM URINE: 217 mg/dL — ABNORMAL HIGH (ref 50–100)
ECSTASY/MDMA URINE: NEGATIVE
FENTANYL, RANDOM URINE: NEGATIVE
METHADONE URINE: NEGATIVE
OPIATES URINE (LOW CUTOFF): NEGATIVE
OXYCODONE URINE: POSITIVE — AB

## 2021-07-04 LAB — COMPREHENSIVE METABOLIC PANEL, NON-FASTING
ALBUMIN: 4.2 g/dL (ref 3.5–5.0)
ALKALINE PHOSPHATASE: 71 U/L (ref 40–110)
ALT (SGPT): 15 U/L (ref 8–22)
ANION GAP: 11 mmol/L (ref 4–13)
AST (SGOT): 16 U/L (ref 8–45)
BILIRUBIN TOTAL: 0.4 mg/dL (ref 0.3–1.3)
BUN/CREA RATIO: 13 (ref 6–22)
BUN: 9 mg/dL (ref 8–25)
CALCIUM: 9.7 mg/dL (ref 8.5–10.0)
CHLORIDE: 109 mmol/L (ref 96–111)
CO2 TOTAL: 23 mmol/L (ref 22–30)
CREATININE: 0.7 mg/dL (ref 0.60–1.05)
ESTIMATED GFR: >90 mL/min/BSA (ref >=60)
GLUCOSE: 86 mg/dL (ref 65–125)
POTASSIUM: 3.9 mmol/L (ref 3.5–5.1)
PROTEIN TOTAL: 7.4 g/dL (ref 6.0–7.9)
SODIUM: 143 mmol/L (ref 136–145)

## 2021-07-04 LAB — CBC WITH DIFF
HCT: 40.6 % (ref 34.8–46.0)
HGB: 12.5 g/dL (ref 11.5–16.0)
MCH: 21.5 pg — ABNORMAL LOW (ref 26.0–32.0)
MCHC: 30.8 g/dL — ABNORMAL LOW (ref 31.0–35.5)
MCV: 69.8 fL — ABNORMAL LOW (ref 78.0–100.0)
MPV: 10.6 fL (ref 8.7–12.5)
PLATELETS: 325 10*3/uL (ref 150–400)
RBC: 5.82 10*6/uL — ABNORMAL HIGH (ref 3.85–5.22)
RDW-CV: 15.1 % (ref 11.5–15.5)
WBC: 8.7 10*3/uL (ref 3.7–11.0)

## 2021-07-04 LAB — HEPATITIS B CORE IGM, AB: HBV CORE IGM ANTIBODY QUALITATIVE: NEGATIVE

## 2021-07-04 LAB — HEPATITIS B SURFACE ANTIGEN: HBV SURFACE ANTIGEN QUALITATIVE: NEGATIVE

## 2021-07-04 LAB — ETHANOL, SERUM: ETHANOL: NOT DETECTED

## 2021-07-04 LAB — HCG, URINE QUALITATIVE, PREGNANCY: HCG URINE QUALITATIVE: NEGATIVE

## 2021-07-04 LAB — HEPATITIS A (HAV) IGM ANTIBODY: HAV IGM: NEGATIVE

## 2021-07-04 LAB — HIV1/HIV2 SCREEN, COMBINED ANTIGEN AND ANTIBODY: HIV SCREEN, COMBINED ANTIGEN & ANTIBODY: NEGATIVE

## 2021-07-04 LAB — HEPATITIS C ANTIBODY SCREEN WITH REFLEX TO HCV PCR: HCV ANTIBODY QUALITATIVE: NEGATIVE

## 2021-07-04 MED ORDER — BUPRENORPHINE HCL 8 MG SUBLINGUAL TABLET
8.0000 mg | SUBLINGUAL_TABLET | SUBLINGUAL | Status: AC
Start: 2021-07-04 — End: 2021-07-04
  Administered 2021-07-04: 8 mg via SUBLINGUAL
  Filled 2021-07-04: qty 1

## 2021-07-04 MED ORDER — BUPRENORPHINE 8 MG-NALOXONE 2 MG SUBLINGUAL TABLET
2.0000 | SUBLINGUAL_TABLET | Freq: Every day | SUBLINGUAL | 0 refills | Status: AC
Start: 2021-07-04 — End: 2021-07-08

## 2021-07-04 MED ORDER — NALOXONE 4 MG/ACTUATION NASAL SPRAY
1.0000 | NASAL | 0 refills | Status: AC | PRN
Start: 2021-07-04 — End: ?

## 2021-07-04 NOTE — Discharge Instructions (Signed)
DRINK PLENTY OF FLUIDS.    IT IS IMPORTANT THAT YOU WAIT UNTIL YOUR MORE SIGNIFICANT WITHDRAWAL FROM NARCOTICS BEFORE YOU START THE SUBOXONE TO KEEP YOU FROM GOING INTO PRECIPITOUS WITHDRAWAL, AS DISCUSSED IN THE EMERGENCY DEPARTMENT.    IS VERY IMPORTANT YOU KEEP YOUR APPOINTMENT AT [facility].      IS ALSO EQUALLY IMPORTANT THAT YOU AVOID ANY OTHER ILLICIT DRUG USE AS DISCUSSED IN THE EMERGENCY DEPARTMENT.    GOOD LUCK!!!!

## 2021-07-04 NOTE — ED Provider Notes (Signed)
[facility]  Emergency Department Visit Note    Date: 07/04/2021  Primary care provider: Richardson Dopp, DO  Means of arrival: private car  History obtained by: patient  History limited by: none      Chief Complaint: Initiation of Suboxone Bridge Program    History of Present Illness     Ellen Dillon, date of birth 08-27-1981, is a 40 y.o. female who presents to the Emergency Department requesting initiation of Suboxone Bridge Program.  Patient states that she has been taking Suboxone 8/2 mg tablets for last 7-8 years and most recently through the [facility] with Dr. Harvie Bridge, but was recently released from the clinic this past week due to a positive drug screen for cocaine.  The patient was given Suboxone taper on or about 06/23/2021.  She states she has been without Suboxone for the last 4 days.  She did take a Percocet 7.5 mg tablet yesterday at 3:00 p.m. because of her withdrawal symptoms.  She feels that she is not relapse in as she has is trying to keep from getting ill until she get back on Suboxone.  She does have an appointment with [facility] for a telephone intake on 07/07/2021 at 9:00 a.m. and office visit on July 07, 2021 at 12:30 p.m. and will require a prescription for Suboxone tablets for 4 days.  Patient is starting to experience withdrawal symptoms to include nausea, chills, rhinorrhea, congestion, myalgias and arthralgias swells loose stools, headache and feeling agitated.  She denies any suicidal homicidal ideations or any other concerns at this time.    Age of Onset:  [age] years of age started with lower set for foot pain by her primary care provider and this went on for several years into her primary care provider stopped her medication abruptly and was not going to prescribe any further medication.  Patient then began to obtain opiate pills off the street she did for proximally 2 years.   Patient has been taking Suboxone past several years and does admit to occasionally using cocaine at times.  Patient denies any previous IV drug use.    Previous Overdoses:  None.    Last Detox/Rehab Attempt:  None/None    Social Hx:  Employment - None, Stay at home Mom                    Living Situation - Home with Mother                    Legal - None    Denies fever, sore throat, ear drainage or pain, cough, sob, chest pain, vomiting, abdominal pain, urinary symptoms, back or flank pain, suicidal or homicidal ideation, hallucinations or rash.    Review of Systems     The pertinent positive and negative symptoms are as per HPI. All other systems reviewed and are negative.    Patient History      Past Medical History:  Past Medical History:   Diagnosis Date   . Anxiety    . Diet controlled White classification A1 gestational diabetes mellitus (GDM) 08/07/2019   . Gestational hypertension 09/01/2019   . Gestational hypertension, third trimester 08/07/2019   . High-risk pregnancy in third trimester 07/17/2019   . Hx of spontaneous abortion, currently pregnant, third trimester    . Hypertension     due to pregnancy   . Multigravida of advanced  maternal age in third trimester 07/17/2019   . 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    . 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 Overweight(278.02)    . Panic attack    . [facility]     age 89    . Tobacco smoking complicating pregnancy, third trimester 02/27/2019     Past Surgical History:  Past Surgical History:   Procedure Laterality Date   . Hx breast biopsy     . Hx cyst incision and drainage  LEFT   . Hx dilation and curettage  01/04/12      Family History:  Family Medical History:     Problem Relation (Age of Onset)    Diabetes Maternal Grandmother, Maternal Grandfather    Healthy Father, Mother, Brother        Social History:  Social History     Tobacco Use   . Smoking status: Current Every Day Smoker     Packs/day: 0.50     Years: 14.00     Pack  years: 7.00     Types: Cigarettes   . Smokeless tobacco: Never Used   Substance Use Topics   . Alcohol use: No   . Drug use: No     Types: Other     Comment: denies/history of opiate pills and cocaine     Social History     Substance and Sexual Activity   Drug Use No   . Types: Other    Comment: denies/history of opiate pills and cocaine     Medications:  Current Outpatient Medications   Medication Sig   . buprenorphine-naloxone (SUBOXONE) 8-2 mg Sublingual Tablet, Sublingual Place 2 Tablets under the tongue Once a day for 4 days   . hydrOXYzine HCL (ATARAX) 25 mg Oral Tablet Take 1 Tablet (25 mg total) by mouth Every 8 hours as needed for Anxiety   . naloxone (NARCAN) 4 mg per spray nasal spray 1 Spray by INTRANASAL route Every 2 minutes as needed (for overdose)     Allergies:   Allergies   Allergen Reactions   . Penicillins      HIVES   . Propoxyphene      HIVES   . Sulfa (Sulfonamides)      HIVES   . Suboxone [Buprenorphine-Naloxone]  Other Adverse Reaction (Add comment)     **Only the strips**  Gives stroke level bp   . Tramadol Swelling     Physical Exam     Vital Signs:  ED Triage Vitals   BP (Non-Invasive) 07/04/21 1915 (!) 155/86   Heart Rate 07/04/21 1913 66   Respiratory Rate 07/04/21 1913 18   Temperature 07/04/21 1913 37.1 C (98.7 F)   SpO2 07/04/21 1913 100 %   Weight 07/04/21 1913 88.5 kg (195 lb)   Height 07/04/21 1913 1.727 m (5\' 8" )     The initial visit vital signs are reviewed as above.     Pulse Ox: 100% on None (Room Air); interpreted by me as:  Normal  GENERAL:  This is a well appearing  40 y.o.  female  who is interactive, appropriate and showing no outward signs of distress.  Patient is upset and tearful.  HEENT:  Atraumatic, normocephalic.  Anicteric.  PERRL.  Conjunctiva normal in appearance.  Oropharynx is clear.  Mucous membranes are moist.     NECK:  Supple, no nuchal rigidity or meningeal signs. Trachea is midline. No anterior cervical adenopathy.  CHEST:  No signs  of trauma.  Normal  and equal expansion of the thorax while breathing.  HEART:  Regular rate and rhythm without an obvious murmur heard.  LUNGS:  Clear to ascultation bilaterally without any adventitious sounds.  ABDOMEN:  Non-distended.  Bowel sounds present. Soft, non-tender to palpation.  BACK:  Non-tender to palpation.  No CVAT.  SKIN:  Warm, good color.  No obvious significant rashes noted. No diaphoresis.  EXTREMITY:  Good range of motion.  Normal strength throughout.    NEURO/PSYCH:  Patient is interactive, appropriate and no gross abnormal neurological findings.  Patient is tearful and upset.  Patient is apologetic for using drugs to begin with as well as using cocaine and recently using Percocet.     Diagnostics     Labs:    Results for orders placed or performed during the hospital encounter of 07/04/21   HCG, URINE QUALITATIVE, PREGNANCY   Result Value Ref Range    HCG URINE QUALITATIVE Negative Negative   COMPREHENSIVE METABOLIC PANEL, NON-FASTING   Result Value Ref Range    SODIUM 143 136 - 145 mmol/L    POTASSIUM 3.9 3.5 - 5.1 mmol/L    CHLORIDE 109 96 - 111 mmol/L    CO2 TOTAL 23 22 - 30 mmol/L    ANION GAP 11 4 - 13 mmol/L    BUN 9 8 - 25 mg/dL    CREATININE 8.76 8.11 - 1.05 mg/dL    BUN/CREA RATIO 13 6 - 22    ESTIMATED GFR >90 >=60 mL/min/BSA    ALBUMIN 4.2 3.5 - 5.0 g/dL     CALCIUM 9.7 8.5 - 57.2 mg/dL    GLUCOSE 86 65 - 620 mg/dL    ALKALINE PHOSPHATASE 71 40 - 110 U/L    ALT (SGPT) 15 8 - 22 U/L    AST (SGOT)  16 8 - 45 U/L    BILIRUBIN TOTAL 0.4 0.3 - 1.3 mg/dL    PROTEIN TOTAL 7.4 6.0 - 7.9 g/dL   DRUG SCREEN, NO CONFIRMATION, URINE   Result Value Ref Range    AMPHETAMINES, URINE Negative Negative    BARBITURATES URINE Negative Negative    BENZODIAZEPINES URINE Negative Negative    BUPRENORPHINE URINE Positive (A) Negative    CANNABINOIDS URINE Negative Negative    COCAINE METABOLITES URINE Positive (A) Negative    METHADONE URINE Negative Negative    OPIATES URINE (LOW CUTOFF) Negative Negative    OXYCODONE  URINE Positive (A) Negative    ECSTASY/MDMA URINE Negative Negative    FENTANYL, RANDOM URINE Negative Negative    CREATININE RANDOM URINE 217 (H) 50 - 100 mg/dL   ETHANOL, SERUM   Result Value Ref Range    ETHANOL None Detected    HIV1/HIV2 SCREEN, COMBINED ANTIGEN AND ANTIBODY   Result Value Ref Range    HIV SCREEN, COMBINED ANTIGEN & ANTIBODY Negative Negative   HEPATITIS A IGM ANTIBODY   Result Value Ref Range    HAV IGM Negative Negative   HEPATITIS B SURFACE ANTIGEN   Result Value Ref Range    HBV SURFACE ANTIGEN QUALITATIVE Negative Negative   HEPATITIS B CORE IGM, AB   Result Value Ref Range    HBV CORE IGM ANTIBODY QUALITATIVE Negative Negative   HEPATITIS C ANTIBODY SCREEN WITH REFLEX TO HCV PCR   Result Value Ref Range    HCV ANTIBODY QUALITATIVE Negative Negative   CBC WITH DIFF   Result Value Ref Range    WBC 8.7 3.7 - 11.0 x10^3/uL  RBC 5.82 (H) 3.85 - 5.22 x10^6/uL    HGB 12.5 11.5 - 16.0 g/dL    HCT 28.4 13.2 - 44.0 %    MCV 69.8 (L) 78.0 - 100.0 fL    MCH 21.5 (L) 26.0 - 32.0 pg    MCHC 30.8 (L) 31.0 - 35.5 g/dL    RDW-CV 10.2 72.5 - 36.6 %    PLATELETS 325 150 - 400 x10^3/uL    MPV 10.6 8.7 - 12.5 fL   MANUAL DIFF AND MORPHOLOGY-SYSMEX   Result Value Ref Range    NEUTROPHIL % 67 %    LYMPHOCYTE %  28 %    MONOCYTE % 3 %    EOSINOPHIL % 2 %    BASOPHIL % 0 %    NEUTROPHIL # 5.83 1.50 - 7.70 x10^3/uL    LYMPHOCYTE # 2.44 1.00 - 4.80 x10^3/uL    MONOCYTE # 0.26 0.20 - 1.10 x10^3/uL    EOSINOPHIL # 0.17 <=0.50 x10^3/uL    BASOPHIL # <0.10 <=0.20 x10^3/uL    HYPOCHROMASIA 2+/Moderate (A) None     Labs reviewed and interpreted by me.    ED Progress Note/Medical Decision Making     Orders Placed This Encounter   . HCG, URINE QUALITATIVE, PREGNANCY   . CBC/DIFF   . COMPREHENSIVE METABOLIC PANEL, NON-FASTING   . DRUG SCREEN, NO CONFIRMATION, URINE   . ETHANOL, SERUM   . HIV1/HIV2 SCREEN, COMBINED ANTIGEN AND ANTIBODY   . HEPATITIS A IGM ANTIBODY   . HEPATITIS B SURFACE ANTIGEN   . HEPATITIS B CORE IGM, AB    . HEPATITIS C ANTIBODY SCREEN WITH REFLEX TO HCV PCR   . CBC WITH DIFF   . MANUAL DIFF AND MORPHOLOGY-SYSMEX   . buprenorphine-naloxone (SUBOXONE) 8-2 mg Sublingual Tablet, Sublingual   . naloxone (NARCAN) 4 mg per spray nasal spray   . buprenorphine (SUBUTEX) sublingual tablet     20:07: NARx CHECK/PDMP Review:  In addition to the non-opioid alternatives that I have recommended, treatment with opioid medication is indicated on a short term basis as a medically necessary treatment for the patient's acute, severe pain. The CMSP has been reviewed via Narxcheck and the patient's score is 460 Narcs, 200 Sedatives, 000 Stimulants,  520 Overdose Risk Score.   I have discussed the benefits and the risks of opioid medications including the potential for addiction and overdose/fetal respiratory depression (if pregnant), particularly of combination with alcohol or other central nervous system depressants.  I have discussed that a minimal quantity of medication is being prescribed and the option to fill the prescription in the lesser quantity is an option.  The patient and/or (significant other, family, care giver, power of attorney, guardian, apparent) is/are  In agreement to proceed with treatment with an opioid medication.  Discharge instructions that explain the risks and side effects of opioid medications have also been provided.    20:19: COWS Score is 8 (Mild Withdrawal).       <5 - No Active Withdrawal    5-12 - Mild Withdrawal  13-24 - Moderate Withdrawal  25-36 - Moderately Severe Withdrawal     >36 - Severe Withdrawal    20:21:  I discussed the patient we do not have Suboxone tablet here in this hospital, but I did contact [facility] pharmacy in a advised me they do have the Suboxone tablets in stock and they do not close until 10:00 p.m.    21:43:  I discussed the above diagnostics with the patient and  advised her of the positive findings on urine drug screen.  She admitted to all these medications.  I discussed  further with her that we would still be able to initiate the Bridge program but she needs to work on avoiding any illicit drug use so she does not get removed from another an MAT program.  Patient is needing to be on Suboxone tablets we do not have those at this facility and she will not be able to be discharged from this facility to go to the pharmacy to pick it up this evening.  There was delay with getting blood drawn on her because she has difficult stick apparently.  Patient will be given Subutex 8 mg tablet sublingual in the ED and will be given a prescription for Suboxone 8/2 mg tablets quantity 8. Patient also be given a prescription for Narcan.  She is advised to keep her appointments as noted above.  Patient offers no other complaints while in the ED.  I discussed with them about about strict return precautions.  They understand that they may return to the emergency department for any new or worsening symptoms or any other concerns prior to seeing their physician or the referral physician.  The patient was seen in collaboration with  Dr. Renee Rival.  The Emergency Department impression and any pertinent test results were discussed in detail.  The patient is currently stable for discharge.  All questions and concerns were answered to their satisfaction and they agree with the plan.  Additional verbal discharge instructions were given and discussed with the patient.  Indications for immediate return to the emergency department and the importance of timely follow up were discussed.      Pre-Disposition Vitals:  Filed Vitals:    07/04/21 1913 07/04/21 1915   BP:  (!) 155/86   Pulse: 66    Resp: 18    Temp: 37.1 C (98.7 F)    SpO2: 100%        Clinical Impression      1. Moderate Opioid Use Disorder   2. Cocaine Use Disorder  3. Initiation of Bridge Program  4. Mild Narcotic Withdrawal      Plan/Disposition     Discharged    Prescriptions:  Discharge Medication List as of 07/04/2021  9:43 PM          Follow  Up:  [facility]  [address]  [place] [address]  [phone]    On 07/07/2021  Keep her scheduled appointment at 9:00 a.m. for telephone intake and 12:30 p.m. the office visit.      Condition on Disposition: Stable      Portions of this note may be dictated using voice recognition software or a dictation   service.  Variances in spelling and vocabulary are possible and unintentional. Not   all errors are caught/corrected. Please notify the Thereasa Parkin if any discrepancies are noted   or if the meaning of any statement is not clear.

## 2021-07-04 NOTE — ED Triage Notes (Signed)
Patient ambulatory to triage for bridge program for the Suboxone program. Patient reports she just needs a dose of medication, she is scheduled to be placed this week in a program.

## 2021-07-04 NOTE — ED Nurses Note (Signed)
Medication List      CONTINUE taking these medications    . * buprenorphine-naloxone 8-2 mg Tablet, Sublingual; Commonly known as:   SUBOXONE; Place 2 Tablets under the tongue Once a day for 4 days  . naloxone 4 mg/actuation Spray, Non-Aerosol; Commonly known as: NARCAN; 1   Spray by INTRANASAL route Every 2 minutes as needed (for overdose)  * This list has 1 medication(s) that are the same as other medications   prescribed for you. Read the directions carefully, and ask your doctor or   other care provider to review them with you.     ASK your doctor about these medications    . * buprenorphine-naloxone 8-2 mg Tablet, Sublingual; Commonly known as:   SUBOXONE; Place 2 Tablets under the tongue Once a day for 5 days; Ask   about: Should I take this medication?  . doxycycline hyclate 100 mg Capsule; Commonly known as: VIBRAMYCIN; Take   1 Capsule (100 mg total) by mouth Twice daily for 10 days; Ask about:   Should I take this medication?  . hydrOXYzine HCL 25 mg Tablet; Commonly known as: ATARAX; Take 1 Tablet   (25 mg total) by mouth Every 8 hours as needed for Anxiety  * This list has 1 medication(s) that are the same as other medications   prescribed for you. Read the directions carefully, and ask your doctor or   other care provider to review them with you.     Patient discharged home with family.  AVS reviewed with patient/care giver.  A written copy of the AVS and discharge instructions was given to the patient/care giver.  Questions sufficiently answered as needed.  Patient/care giver encouraged to follow up with PCP as indicated.  In the event of an emergency, patient/care giver instructed to call 911 or go to the nearest emergency room.

## 2021-07-04 NOTE — Consults (Signed)
Pt called to request Bridge program. Stated she was "discharged from [facility] last week" and "has been w/o medication since". Pt stated she was "going to [facility] in [place], [place]. Last reported use was "yesterday evening". Discussed the risk of precipitated withdrawal. Pt did not want to wait until tomorrow for ED Bridge.     Pt reported she "has" an appointment at [facility] for MAT in [place]. This writer could not confirm appointment.     ED Bridge: 07/04/21 - 5pm    Follow-up: 07/07/21 - 12:30 - Roma Kayser    Reminder slips created & e-mail sent

## 2021-07-04 NOTE — Consults (Signed)
Secondary Screen: yes    ASSIST score = 39    PHQ-9 score = n/a    GAD7 score = n/a    Substance(s): cocaine and suboxone    SA Brief Intervention (yes/no): yes    MH Brief Intervention (yes/no): no    TMI Benefit: (yes/no) no    TMI Referral: (yes/no) no      Detox (yes/no) no    Bridge/OP (yes/no) yes    Substance Use Treatment Referral: (yes/no): yes    Location: [facility]    Supportive Services Referral/Resources: (yes/no) yes    Location: [facility]    Warm Handoff: yes    Outcome: Bridge assessment completed    Follow-up in 1 month: yes    Follow-up in 3 months: yes    Notes: Pt reported she "started using pain pills at 40 y/o" and she "was prescribed them after an injury". Stated the doctor "just stopped prescribing them" so she started using cocaine and substances "off the street". Pt has been "using Suboxone for "5 or 6 years". She was recently a pt at the COAT program but was "discharged due to her cocaine use". Pt reported she "uses cocaine for energy". She is a single parent of a 2 y/o. Stated "cocaine helps her feel normal". This Clinical research associate discussed the health impact of using cocaine and the risk. Pt verbalized understanding. Discussed MH counseling to work on coping/parenting strategies. Pt is in agreement.    BNI: Pt agreed to comply with all the requirements of her MAT program at [facility]. Pt agreed to meet with a counselor, at [facility], to work on coping strategies and "stopping cocaine use". Provided pt with SBIRT Brochure and [facility] info. Pt agreed to call [facility] for support and/or groups.

## 2021-07-05 NOTE — Consults (Signed)
Faxed referral to [facility]

## 2021-08-16 ENCOUNTER — Encounter (INDEPENDENT_AMBULATORY_CARE_PROVIDER_SITE_OTHER): Payer: Self-pay | Admitting: Student in an Organized Health Care Education/Training Program

## 2021-08-16 ENCOUNTER — Other Ambulatory Visit: Payer: Self-pay

## 2021-08-16 ENCOUNTER — Ambulatory Visit (INDEPENDENT_AMBULATORY_CARE_PROVIDER_SITE_OTHER): Payer: Medicaid Other | Admitting: Student in an Organized Health Care Education/Training Program

## 2021-08-16 VITALS — BP 126/70 | HR 70 | Temp 97.1°F | Resp 17 | Wt 188.6 lb

## 2021-08-16 DIAGNOSIS — K047 Periapical abscess without sinus: Secondary | ICD-10-CM

## 2021-08-16 DIAGNOSIS — Z6828 Body mass index (BMI) 28.0-28.9, adult: Secondary | ICD-10-CM

## 2021-08-16 MED ORDER — CEPHALEXIN 500 MG CAPSULE
500.0000 mg | ORAL_CAPSULE | Freq: Four times a day (QID) | ORAL | 0 refills | Status: AC
Start: 2021-08-16 — End: 2021-08-23

## 2021-08-16 NOTE — Progress Notes (Signed)
FAMILY MEDICINE, [facility] MEDICAL OFFICE BUILDING  [address]  [place] [address]    Acute Visit     Name: Ellen Dillon MRN:  [id number]   Date: 08/16/2021 Age: 40 y.o.         Chief Complaint(s):   Chief Complaint   Patient presents with   . Toothache       SUBJECTIVE:  Ellen Dillon is a 40 y.o. female presenting today with acute complaint of infected tooth. States she was recently given Abx for this but needs another because she has yet to be able to get in with a dentist for tooth removal.      Review of Systems   Constitutional: Negative for chills and fever.   HENT: Negative for sinus pain and sore throat.        Past Medical History:  Past Medical History:   Diagnosis Date   . Anxiety    . Diet controlled White classification A1 gestational diabetes mellitus (GDM) 08/07/2019   . Gestational hypertension 09/01/2019   . Gestational hypertension, third trimester 08/07/2019   . High-risk pregnancy in third trimester 07/17/2019   . Hx of spontaneous abortion, currently pregnant, third trimester    . Hypertension     due to pregnancy   . Multigravida of advanced maternal age in third trimester 07/17/2019   . 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    . 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 Overweight(278.02)    . Panic attack    . Seizure (CMS [facility])     age 27    . Tobacco smoking complicating pregnancy, third trimester 02/27/2019           Medications:  Current Outpatient Medications   Medication Sig   . cephalexin (KEFLEX) 500 mg Oral Capsule Take 1 Capsule (500 mg total) by mouth Four times a day for 7 days   . hydrOXYzine HCL (ATARAX) 25 mg Oral Tablet Take 1 Tablet (25 mg total) by mouth Every 8 hours as needed for Anxiety   . naloxone (NARCAN) 4 mg per spray nasal spray 1 Spray by INTRANASAL route Every 2 minutes as needed (for overdose)        Allergies:  Allergies   Allergen Reactions   . Penicillins      HIVES   . Propoxyphene      HIVES   . Sulfa  (Sulfonamides)      HIVES   . Suboxone [Buprenorphine-Naloxone]  Other Adverse Reaction (Add comment)     **Only the strips**  Gives stroke level bp   . Tramadol Swelling        Social History:  Social History     Tobacco Use   . Smoking status: Current Every Day Smoker     Packs/day: 0.50     Years: 14.00     Pack years: 7.00     Types: Cigarettes   . Smokeless tobacco: Never Used   Substance Use Topics   . Alcohol use: No   . Drug use: No     Types: Other     Comment: denies/history of opiate pills and cocaine        Family History:  Family Medical History:     Problem Relation (Age of Onset)    Diabetes Maternal Grandmother, Maternal Grandfather    Healthy Father, Mother, Brother            OBJECTIVE:  BP 126/70   Pulse 70  Temp 36.2 C (97.1 F)   Resp 17   Wt 85.5 kg (188 lb 9.6 oz)   SpO2 99%   BMI 28.68 kg/m         Physical Exam  Constitutional:       General: She is not in acute distress.  HENT:      Head: Normocephalic and atraumatic.      Mouth/Throat:      Comments: Poor dentition  Eyes:      Conjunctiva/sclera: Conjunctivae normal.   Pulmonary:      Effort: Pulmonary effort is normal. No respiratory distress.   Neurological:      Mental Status: She is alert and oriented to person, place, and time.   Psychiatric:         Mood and Affect: Mood and affect normal.         Cognition and Memory: Memory normal.         Judgment: Judgment normal.           ASSESSMENT and PLAN:    ICD-10-CM    1. Dental abscess  K04.7 cephalexin (KEFLEX) 500 mg Oral Capsule     RTC PRN    Richardson Dopp, DO  08/16/2021, 16:22

## 2021-08-16 NOTE — Nursing Note (Signed)
Chief Complaint:   Chief Complaint            Toothache         Functional Health Screen        Vital Signs  BP 126/70   Pulse 70   Temp 36.2 C (97.1 F)   Resp 17   Wt 85.5 kg (188 lb 9.6 oz)   SpO2 99%   BMI 28.68 kg/m       Social History     Tobacco Use   Smoking Status Current Every Day Smoker   . Packs/day: 0.50   . Years: 14.00   . Pack years: 7.00   . Types: Cigarettes   Smokeless Tobacco Never Used     Allergies  Allergies   Allergen Reactions   . Penicillins      HIVES   . Propoxyphene      HIVES   . Sulfa (Sulfonamides)      HIVES   . Suboxone [Buprenorphine-Naloxone]  Other Adverse Reaction (Add comment)     **Only the strips**  Gives stroke level bp   . Tramadol Swelling     Medication History  Reviewed for OTC medication and any new medications, provider will review medication history  Care Team  Patient Care Team:  Richardson Dopp, DO as PCP - General (FAMILY MEDICINE)  Immunizations - last 24 hours     None        [place], [place]  08/16/2021, 16:14

## 2021-10-11 NOTE — Progress Notes (Signed)
[address]  [place], [place]  [address]  Behavioral Health    Comprehensive Opioid Addiction Treatment (COAT) Program   Individual Therapy Note    Ellen Dillon   DOB:  [birth year]  Date of Service : 02/25/2021  TIME:  1:00 p.m.    DURATION:  60 min    Mode of Session: In Person; face to face session  Patient's location: In Office - [employer]  Patient/family aware of provider location: Yes  Patient/family consent for video visit: Yes  Interview and observation performed by: Terrance Mass, LPC       CHIEF COMPLAINT: Addiction Problem     SUBJECTIVE: Ellen Dillon presents today for individual therapy through the Comprehensive Opioid Addiction Treatment (COAT) program.    Ellen Dillon's progress, psychosocial functioning and treatment response were discussed during this visit to assist in maintaining recovery.        Focus on recovery, well-being, coping.  Client noted that she feels random screening is excessive; she noted 6 random since she started here (1 and half months).  Noted to client that random scan be as much as providers feel is needed.  Client stated she has been threatened with intensive protocol 3 times.  She noted she continues to use cocaine and does not believe this is a relapse.  Discussed rules for program as total abstinence.  Client stated constant randoms and treatment from staff make her feel like she is not sober; makes her feel ashamed.  Discussed that use of cocaine is a relapse.  Client stated her levels of cocaine are going down.  Discussed UDS is and consistent high levels of cocaine.  Client stated in last program UDS is were not failed for cocaine.  Noted this program is based on total abstinence.  Discussed that individuals in recovery often replace 1 addiction for another.  Client noted she keeps her cocaine use a secret from her mother and others.  Noted that secrets indicates client is having to hide use.  Processed client feelings.  Noted client frustration and shame.  Client  noted feeling frustrated that she now has to come into the office for group sessions due to issues with Internet connections.  Process stressors in the home.  Discussed client coping skills.    OBJECTIVE:      Orientation: Fully oriented to person, place, time and situation.  Marland Kitchen     Appearance:  neatly dressed, casually dressed, dressed appropriately, well groomed and no apparent distress.     Eye Contact:  good.     Behavior:  cooperative.     Attention:  good.     Speech:  normal rate and volume.     Motor:  no psychomotor retardation or agitation.     Mood:  euthymic and frustrated.     Affect:  broad and congruent to mood.     Thought Process:  linear.     Thought Content:  no paranoia or delusions.     Suicidal Ideation:  none.     Homicidal Ideation:  none   Perception:  no hallucinations endorsed   Cognition:  some abstract ability.     Insight:  fair.     Judgement:  fair.     Goals/wishes:  Increase positive coping skills; work towards total abstinence.     Other objective findings:  None    ASSESSMENT: Opioid Use Disorder, Severe, on maintenance therapy, dependence    PROCEDURE: This is a monthly process and psychoeducational individual therapy designed  to be appropriate to individuals with opioid dependence in early recovery. The focus is to educate about the process of building a sound recovery program, to facilitate the understanding of the disease of addiction and how Suboxone works to aid in the development of recovery, and to support the practice of new recovery tools. Attendance and experience at 12 step meetings are on hold due to COVID-19. Patient was encouraged to identify triggers for relapse, employ relapse prevention strategies and to develop a supportive social network. Therapist utilized client centered therapy and CBT to provide supportive therapy and explore coping strategies for relapse prevention.     PLAN:  Maintain abstinence, take medication as prescribed, follow  recommendations of Treatment Team, continue attending the weekly group sessions and attend monthly individual counseling.        Terrance Mass, [facility]  Clinical Therapist
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
## 2021-10-13 NOTE — Progress Notes (Signed)
[address]  [place], [place]  [address]  Behavioral Health    Comprehensive Opioid Addiction Treatment (COAT) Program   Individual Therapy Note    Ellen Dillon   DOB:  June 20, 1981  Date of Service : 05/13/2021  TIME:  3:00 p.m.  DURATION:  60 min    Mode of Session: In Person; face to face session  Patient's location: In Office - [employer]  Patient/family aware of provider location: Yes  Patient/family consent for video visit: Yes  Interview and observation performed by: Terrance Mass, LPC       CHIEF COMPLAINT: Addiction Problem     SUBJECTIVE: Ellen Dillon presents today for individual therapy through the Comprehensive Opioid Addiction Treatment (COAT) program.  Ellen Dillon's progress, psychosocial functioning and treatment response were discussed during this visit to assist in maintaining recovery.        Focus he of session was client recovery, well-being, coping.  Client noted she was told she has 1 month to try to get clean from everything (no alcohol, no cocaine, no marijuana).  She noted provider recommended she go to rehab.  Client noted she uses cocaine has a stress reliever, that it helps when she is overwhelmed.  She noted she also likes the rush.  She stated she likes to feel up and upbeat.  She noted she uses cocaine approximately 1 time per week.  She stated biological mother has not been in the house for the past week and this has made it easier for her to staying.  Client noted she had hoped the biological mother was gone for good however her mother let biological mother come back because she had no place to go.  Client stated mother was more overbearing when biological mother was gone.  He client noted she told her mother about the drug use but told mother it was [place] verses cocaine.  Client stated her mother would think cocaine was worse than Molly.  Client shared she has only been using cocaine for the past 2 years.  Discussed lying about actual use supports client addiction to drug.   Client stated she does not believe mother would allow client to go to rehab because she needs to be controlling client.  Process will would help client abstain from cocaine use.      OBJECTIVE:      Orientation: Fully oriented to person, place, time and situation.  Marland Kitchen     Appearance:  neatly dressed, casually dressed, dressed appropriately and no apparent distress.     Eye Contact:  good.     Behavior:  cooperative, appropriate social reciprocity and Denial.     Attention:  good.     Speech:  normal rate and volume.     Motor:  no psychomotor retardation or agitation.     Mood:  euthymic and frustrated.     Affect:  broad and congruent to mood.     Thought Process:  linear.     Thought Content:  no paranoia or delusions.     Suicidal Ideation:  none.     Homicidal Ideation:  none   Perception:  no hallucinations endorsed   Cognition:  good abstract ability.     Insight:  fair.     Judgement:  fair.     Goals/wishes:  Abstain from all substances.     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 The focus is to educate about the process of building a sound recovery program, to facilitate the understanding of the disease of addiction and how Suboxone works to aid in the development of recovery, and to support the practice of new recovery tools.  Clients are expected to attend and experience 4 12 step meetings per week in early recovery. Patient was encouraged to identify triggers for relapse, employ relapse prevention strategies and to develop a supportive social network. Therapist utilized client centered therapy and CBT to provide supportive therapy and explore coping strategies for relapse prevention.      PLAN:  Maintain abstinence, take medication as prescribed, follow recommendations of Treatment Team, continue attending the weekly group sessions and attend monthly individual counseling.        Terrance Mass, [facility]  Clinical Therapist

## 2021-11-27 ENCOUNTER — Encounter (INDEPENDENT_AMBULATORY_CARE_PROVIDER_SITE_OTHER): Payer: Self-pay

## 2021-11-27 ENCOUNTER — Ambulatory Visit (INDEPENDENT_AMBULATORY_CARE_PROVIDER_SITE_OTHER): Payer: No Typology Code available for payment source | Admitting: Physician Assistant

## 2021-11-27 DIAGNOSIS — F419 Anxiety disorder, unspecified: Secondary | ICD-10-CM | POA: Insufficient documentation

## 2021-11-27 DIAGNOSIS — B349 Viral infection, unspecified: Secondary | ICD-10-CM

## 2021-11-27 DIAGNOSIS — Z20828 Contact with and (suspected) exposure to other viral communicable diseases: Secondary | ICD-10-CM

## 2021-11-27 DIAGNOSIS — R569 Unspecified convulsions: Secondary | ICD-10-CM | POA: Insufficient documentation

## 2021-11-27 DIAGNOSIS — R059 Cough, unspecified: Secondary | ICD-10-CM

## 2021-11-27 LAB — VH AMB POCT SOFIA 2(TM) FLU + SARS AG FIA
Sofia Influenza A Ag POCT: NEGATIVE
Sofia Influenza B Ag POCT: NEGATIVE
Sofia SARS-CoV-2 Ag POCT: NEGATIVE

## 2021-11-27 LAB — VH AMB POCT SOFIA STREP A+ FIA: Sofia Rapid STrep A+ FIA POCT: NEGATIVE

## 2021-11-27 MED ORDER — BENZONATATE 200 MG PO CAPS
200.0000 mg | ORAL_CAPSULE | Freq: Three times a day (TID) | ORAL | 0 refills | Status: AC | PRN
Start: 2021-11-27 — End: 2021-12-02

## 2021-11-27 MED ORDER — OSELTAMIVIR PHOSPHATE 75 MG PO CAPS
75.0000 mg | ORAL_CAPSULE | Freq: Two times a day (BID) | ORAL | 0 refills | Status: DC
Start: 2021-11-27 — End: 2021-11-28

## 2021-11-27 NOTE — Progress Notes (Signed)
Subjective:    Patient ID: Maria Wise is a 40 y.o. female.    HPI  40 year old female presents due to cough, runny nose, throat pain and "feeling warm".  Patient states she has had on and off symptoms for 3 weeks.  Patient's daughter tested positive for influenza today in clinic.  Patient has not taken anything for her symptoms.  Eating and drinking fine.  Denies shortness of breath, body aches, headache or GI symptoms.  The following portions of the patient's history were reviewed and updated as appropriate: allergies, current medications, past family history, past medical history, past social history, past surgical history, and problem list.    Past Medical History:   Diagnosis Date    Anxiety     Substance abuse      Review of Systems   Constitutional:  Negative for appetite change and chills.        "Feeling warm"   HENT:  Positive for rhinorrhea and sore throat. Negative for congestion, trouble swallowing and voice change.    Eyes:  Negative for discharge and redness.   Respiratory:  Positive for cough. Negative for shortness of breath.    Gastrointestinal:  Negative for abdominal pain, diarrhea, nausea and vomiting.   Musculoskeletal:  Negative for myalgias.   Skin:  Negative for rash.   Allergic/Immunologic: Negative for environmental allergies and food allergies.   Neurological:  Negative for headaches.       Objective:    BP 135/80 (BP Site: Right arm, Patient Position: Sitting, Cuff Size: Large)   Pulse 61   Temp 98 F (36.7 C) (Tympanic)   Resp 18   Ht 1.727 m (5\' 8" )   Wt 83.9 kg (185 lb)   SpO2 100%   BMI 28.13 kg/m     Physical Exam  Vitals and nursing note reviewed.   Constitutional:       General: She is not in acute distress.     Appearance: Normal appearance.   HENT:      Head: Normocephalic and atraumatic.      Right Ear: External ear normal.      Left Ear: External ear normal.      Nose: Nose normal. No congestion or rhinorrhea.      Mouth/Throat:      Mouth: Mucous  membranes are moist.      Pharynx: Oropharynx is clear. No oropharyngeal exudate or posterior oropharyngeal erythema.   Eyes:      Conjunctiva/sclera: Conjunctivae normal.   Cardiovascular:      Rate and Rhythm: Normal rate and regular rhythm.   Pulmonary:      Effort: Pulmonary effort is normal.      Breath sounds: Normal breath sounds.   Musculoskeletal:         General: Normal range of motion.      Cervical back: Normal range of motion.   Lymphadenopathy:      Cervical: No cervical adenopathy.   Skin:     General: Skin is warm.   Neurological:      Mental Status: She is alert and oriented to person, place, and time.   Psychiatric:         Mood and Affect: Mood normal.         Behavior: Behavior normal.           Lab Results from today's visit:  Recent Results (from the past 12 hour(s))   [facility] Flu + SARS Antigen FIA POCT  Collection Time: 11/27/21 10:40 AM   Result Value Ref Range    [facility] SARS-CoV-2 Ag POCT Negative Negative    [facility] Influenza A Ag POCT Negative Negative    [facility] Influenza B Ag POCT Negative Negative   [facility] Rapid Strep A+ FIA POCT    Collection Time: 11/27/21 10:40 AM   Result Value Ref Range    POCT QC Pass     [facility] Rapid STrep A+ FIA POCT Negative Negative       Radiology Results from today's visit:  No results found.    Assessment and Plan:       Maria Wise was seen today for cough.    Diagnoses and all orders for this visit:    Acute viral syndrome  -     [facility] Flu + SARS Antigen FIA POCT  -     [facility] Rapid Strep A+ FIA POCT  -     oseltamivir (Tamiflu) 75 MG capsule; Take 1 capsule (75 mg) by mouth 2 (two) times daily for 5 days  -     benzonatate (TESSALON) 200 MG capsule; Take 1 capsule (200 mg) by mouth 3 (three) times daily as needed for Cough    Likely influenza based on positive exposure and clinical presentation.     How is the flu treated?  The flu usually gets better after 7 days or so.   Easing flu symptoms  Drink lots of fluids such as water, juice, and warm soup. A  good rule is to drink enough so that you urinate your normal amount.  Get plenty of rest.  Tylenol/Ibuprofen for fever/pain.  Call your provider if your fever is 100.48F (38C) or higher, or you become dizzy, lightheaded, or short of breath.  Taking steps to protect others  Wash your hands often, especially after coughing or sneezing. Or clean your hands with an alcohol-based hand cleaner containing at least 60% alcohol.  Cough or sneeze into a tissue. Then throw the tissue away and wash your hands. If you don't have a tissue, cough and sneeze into your elbow.  Stay home until at least 24 hours after you no longer have a fever or chills. Be sure the fever isn't being hidden by fever-reducing medicine.  Don't share food, utensils, drinking glasses, or a toothbrush with others.    Follow up with PCP if symptoms persist.  RTC for worsening symptoms. Pt/guardian acknowledges understanding and concurs with plan of care.      Parke Poisson, PA-C  [facility]  11/27/2021  11:44 AM

## 2021-11-28 ENCOUNTER — Other Ambulatory Visit (INDEPENDENT_AMBULATORY_CARE_PROVIDER_SITE_OTHER): Payer: Self-pay | Admitting: Student in an Organized Health Care Education/Training Program

## 2021-11-28 DIAGNOSIS — J069 Acute upper respiratory infection, unspecified: Secondary | ICD-10-CM

## 2021-11-28 DIAGNOSIS — J101 Influenza due to other identified influenza virus with other respiratory manifestations: Secondary | ICD-10-CM

## 2021-11-28 MED ORDER — OSELTAMIVIR PHOSPHATE 75 MG PO CAPS
75.0000 mg | ORAL_CAPSULE | Freq: Two times a day (BID) | ORAL | 0 refills | Status: AC
Start: 2021-11-28 — End: 2021-12-03

## 2021-12-30 ENCOUNTER — Other Ambulatory Visit: Payer: Self-pay

## 2021-12-30 ENCOUNTER — Ambulatory Visit (INDEPENDENT_AMBULATORY_CARE_PROVIDER_SITE_OTHER): Payer: Medicaid Other | Admitting: Family

## 2021-12-30 ENCOUNTER — Encounter (INDEPENDENT_AMBULATORY_CARE_PROVIDER_SITE_OTHER): Payer: Self-pay | Admitting: Family

## 2021-12-30 ENCOUNTER — Ambulatory Visit: Payer: Medicaid Other | Attending: Family | Admitting: Family

## 2021-12-30 VITALS — BP 138/78 | HR 68 | Temp 98.3°F | Ht 68.0 in | Wt 188.0 lb

## 2021-12-30 DIAGNOSIS — H9209 Otalgia, unspecified ear: Secondary | ICD-10-CM

## 2021-12-30 DIAGNOSIS — Z6828 Body mass index (BMI) 28.0-28.9, adult: Secondary | ICD-10-CM

## 2021-12-30 DIAGNOSIS — J3489 Other specified disorders of nose and nasal sinuses: Secondary | ICD-10-CM

## 2021-12-30 DIAGNOSIS — Z20822 Contact with and (suspected) exposure to covid-19: Secondary | ICD-10-CM

## 2021-12-30 DIAGNOSIS — R059 Cough, unspecified: Secondary | ICD-10-CM

## 2021-12-30 DIAGNOSIS — J329 Chronic sinusitis, unspecified: Secondary | ICD-10-CM

## 2021-12-30 DIAGNOSIS — J40 Bronchitis, not specified as acute or chronic: Secondary | ICD-10-CM

## 2021-12-30 LAB — POC COVID-19, FLU A/B, RSV RAPID BY PCR (RESULTS)
INFLUENZA VIRUS A, PCR 4PLEX, POC: NEGATIVE
INFLUENZA VIRUS B, PCR 4PLEX, POC: NEGATIVE
RSV, PCR 4PLEX, POC: NEGATIVE
SARS-COV-2, POC: NEGATIVE

## 2021-12-30 MED ORDER — BENZONATATE 200 MG CAPSULE
200.0000 mg | ORAL_CAPSULE | Freq: Three times a day (TID) | ORAL | 0 refills | Status: AC | PRN
Start: 2021-12-30 — End: 2022-01-09

## 2021-12-30 MED ORDER — AZITHROMYCIN 250 MG TABLET
ORAL_TABLET | ORAL | 0 refills | Status: DC
Start: 2021-12-30 — End: 2022-01-09

## 2021-12-30 MED ORDER — ALBUTEROL SULFATE HFA 90 MCG/ACTUATION AEROSOL INHALER
1.0000 | INHALATION_SPRAY | Freq: Four times a day (QID) | RESPIRATORY_TRACT | 0 refills | Status: AC | PRN
Start: 2021-12-30 — End: 2022-01-06

## 2021-12-30 NOTE — Progress Notes (Signed)
[facility], [place]  [place] [address]  Dept: [phone]  Dept Fax: [phone]  Loc: [phone]  [place] Fax: [phone]     Patient Name: Ellen Dillon  Date of Service: 12/30/21  Date of Birth: [birth year]    Chief Complaint: Cough and Congestion      HPI: Ellen Dillon is a 41 y.o. female who presents today with concern for worsening cough over the past 3-4 days following influenza illness.  She states she never fully recovered from influenza.   She also reports sinus pressure and headache.  Medications tried-none  Sick for since in the home with similar symptoms.    History:  Vital signs and history as obtained by clinical staff.  Past Medical History:   Diagnosis Date   . Anxiety    . Diet controlled White classification A1 gestational diabetes mellitus (GDM) 08/07/2019   . Gestational hypertension 09/01/2019   . Gestational hypertension, third trimester 08/07/2019   . High-risk pregnancy in third trimester 07/17/2019   . Hx of spontaneous abortion, currently pregnant, third trimester    . Hypertension     due to pregnancy   . Multigravida of advanced maternal age in third trimester 07/17/2019   . 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    . 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 Overweight(278.02)    . Panic attack    . Seizure (CMS [facility])     age 3    . Tobacco smoking complicating pregnancy, third trimester 02/27/2019         Past Surgical History:   Procedure Laterality Date   . HX BREAST BIOPSY      x3 cyst    . HX CYST INCISION AND DRAINAGE  LEFT   . HX DILATION AND CURETTAGE  01/04/12     Dilation and Curettage suction          Family Medical History:     Problem Relation (Age of Onset)    Diabetes Maternal Grandmother, Maternal Grandfather    Healthy Father, Mother, Brother          Social History     Socioeconomic History   . Marital status: Single   . Number of children: 0   . Years of education:  12.5   Tobacco Use   . Smoking status: Every Day     Packs/day: 0.50     Years: 14.00     Pack years: 7.00     Types: Cigarettes   . Smokeless tobacco: Never   Substance and Sexual Activity   . Alcohol use: No   . Drug use: No     Types: Other     Comment: denies/history of opiate pills and cocaine   . Sexual activity: Yes     Partners: Male   Other Topics Concern   . Ability to Walk 2 Flight of Steps without SOB/CP Yes   . Ability To Do Own ADL's Yes     Expanded Substance History     Additional history       Allergies:  Allergies   Allergen Reactions   . Penicillins      HIVES   . Propoxyphene      HIVES   . Sulfa (Sulfonamides)      HIVES   . Suboxone [Buprenorphine-Naloxone]  Other Adverse Reaction (Add comment)     **Only the strips**  Gives stroke level bp   .  Tramadol Swelling     Problem List:  Patient Active Problem List    Diagnosis   . Tobacco use disorder   . Hx of seizure disorder   . Encounter for monitoring Subutex maintenance therapy   . Hx: UTI (urinary tract infection)   . Marijuana use   . Seizure (CMS [facility])   . Family history of diabetes mellitus in mother   . Cystic fibrosis carrier   . Heroin use complicating pregnancy   . History of pregnancy induced hypertension   . Anxiety   . Hx of induced abortion     Medication:  Outpatient Encounter Medications as of 12/30/2021   Medication Sig Dispense Refill   . albuterol sulfate (PROAIR HFA) 90 mcg/actuation Inhalation oral inhaler Take 1-2 Puffs by inhalation Every 6 hours as needed for up to 7 days 8.5 g 0   . azithromycin (ZITHROMAX) 250 mg Oral Tablet Take 500 mg (2 tab) on day 1; take 250 mg (1 tab) on days 2-5. 6 Tablet 0   . Benzonatate (TESSALON) 200 mg Oral Capsule Take 1 Capsule (200 mg total) by mouth Three times a day as needed for Cough for up to 10 days 25 Capsule 0   . buprenorphine-naloxone (SUBOXONE) 8-2 mg Sublingual Tablet, Sublingual      . naloxone (NARCAN) 4 mg per spray nasal spray 1 Spray by INTRANASAL route Every 2 minutes as  needed (for overdose) 1 Each 0   . [DISCONTINUED] hydrOXYzine HCL (ATARAX) 25 mg Oral Tablet Take 1 Tablet (25 mg total) by mouth Every 8 hours as needed for Anxiety 90 Tablet 0     No facility-administered encounter medications on file as of 12/30/2021.           Exam:  General Vitals: BP 138/78   Pulse 68   Temp 36.8 C (98.3 F) (Tympanic)   Ht 1.727 m (5\' 8" )   Wt 123XX123 kg (188 lb)   SpO2 99%   BMI 28.59 kg/m       Physical Exam     Assessment and Plan:    ICD-10-CM    1. Sinobronchitis  J32.9     J40         Medication Orders   Medications   . azithromycin (ZITHROMAX) 250 mg Oral Tablet     Sig: Take 500 mg (2 tab) on day 1; take 250 mg (1 tab) on days 2-5.     Dispense:  6 Tablet     Refill:  0   . Benzonatate (TESSALON) 200 mg Oral Capsule     Sig: Take 1 Capsule (200 mg total) by mouth Three times a day as needed for Cough for up to 10 days     Dispense:  25 Capsule     Refill:  0   . albuterol sulfate (PROAIR HFA) 90 mcg/actuation Inhalation oral inhaler     Sig: Take 1-2 Puffs by inhalation Every 6 hours as needed for up to 7 days     Dispense:  8.5 g     Refill:  0       PCR Covid/Influenza/RSV testing ordered today - results negative, discussed at time of urgent care visit.      Will treat for sinus bronchitis  -Azithromycin as directed she is allergic to the penicillins.  - Supportive care including tylenol or ibuprofen for fever or pain, rest, fluids, hot tea with honey, throat lozenges, NS sinus rinses prn, humidified air at bedside.   -  Tessalon p.r.n. for cough  - albuterol p.r.n. for wheezing    Follow-up with PCP if symptoms fail to improve over the next 7-10 days ED for worsening symptoms.      Patient agrees with plan questions answered for    Johney Maine, APRN  12/30/2021, 13:34

## 2021-12-30 NOTE — Nursing Note (Signed)
BP 138/78   Pulse 68   Temp 36.8 C (98.3 F) (Tympanic)   Ht 1.727 m (5\' 8" )   Wt 85.3 kg (188 lb)   SpO2 99%   BMI 28.59 kg/m     Carloyn Jaeger, RN  12/30/2021, 12:33

## 2022-01-09 ENCOUNTER — Telehealth: Payer: Medicaid Other | Admitting: Student in an Organized Health Care Education/Training Program

## 2022-01-09 DIAGNOSIS — M79672 Pain in left foot: Secondary | ICD-10-CM

## 2022-01-09 DIAGNOSIS — F419 Anxiety disorder, unspecified: Secondary | ICD-10-CM

## 2022-01-09 DIAGNOSIS — M79671 Pain in right foot: Secondary | ICD-10-CM

## 2022-01-09 MED ORDER — HYDROXYZINE HCL 25 MG TABLET
25.0000 mg | ORAL_TABLET | Freq: Three times a day (TID) | ORAL | 3 refills | Status: DC | PRN
Start: 2022-01-09 — End: 2022-07-28

## 2022-01-09 NOTE — Progress Notes (Signed)
FAMILY MEDICINE, [facility] MEDICAL OFFICE BUILDING  [address]  [place] [address]       Name: Ellen Dillon MRN:  [id number]   Date: 01/09/2022 Age: 41 y.o.       VIDEO VISIT    Patient's Location: [facility] [address]  Patient/family aware of provider location: Yes  Patient/family consent for video visit: Yes  Interview and observation performed by: Richardson Dopp, DO    CC:   Chief Complaint   Patient presents with   . Feet Hurt        Subjective:  Ellen Dillon is a 41 y.o. female here today via video visit for podiatry referral, per patient. She states bone spurs are on both heels -- she has seen podiatry 3 years ago for this. She states she also has large calluses that she has had trouble removing herself at home. Patient reports pain is in middle of feet. Pain is worse with being on them. She states she is flat footed. Flip flops and slippers are most comfortable for her. She states sneakers hurt her foot.    Review of Systems   Musculoskeletal: Negative for joint pain and myalgias.        +foot pain        Medications:  Benzonatate (TESSALON) 200 mg Oral Capsule, Take 1 Capsule (200 mg total) by mouth Three times a day as needed for Cough for up to 10 days  buprenorphine-naloxone (SUBOXONE) 8-2 mg Sublingual Tablet, Sublingual,   naloxone (NARCAN) 4 mg per spray nasal spray, 1 Spray by INTRANASAL route Every 2 minutes as needed (for overdose)  azithromycin (ZITHROMAX) 250 mg Oral Tablet, Take 500 mg (2 tab) on day 1; take 250 mg (1 tab) on days 2-5.    No facility-administered medications prior to visit.        Objective: patient unable to show me her feet on video  General: appears well, NAD  Pulm: unlabored breathing  Neuro: grossly normal  Skin: normal tone  Psych: normal affect, behavior      Assessment/Plan:    ICD-10-CM    1. Pain in both feet  M79.671 Refer to [facility]    [phone]       2. Anxiety  F41.9 hydrOXYzine HCL  (ATARAX) 25 mg Oral Tablet        Referral placed.  Hydroxyzine refilled.    RTC PRN    Richardson Dopp, DO  01/09/2022, 13:32

## 2022-01-11 ENCOUNTER — Telehealth (HOSPITAL_COMMUNITY): Payer: Self-pay | Admitting: Student in an Organized Health Care Education/Training Program

## 2022-01-11 DIAGNOSIS — M79673 Pain in unspecified foot: Secondary | ICD-10-CM

## 2022-01-11 NOTE — Telephone Encounter (Signed)
Patient states she forgot to ask Dr. Grandville Silos at her appointment on Monday about getting lidocaine patches for her feet and her back. Patient would like to know if these can be sent into the pharmacy.     Lendon Collar, [place]  01/11/2022, 10:41

## 2022-01-11 NOTE — Telephone Encounter (Signed)
Pt mother called back to check the status of the med patch for her daughter. She is stating that she is in a lot of pain.   Edwyna Ready, centralized scheduler  [facility]  01/11/2022, 16:10

## 2022-01-12 MED ORDER — LIDOCAINE 5 % TOPICAL PATCH
1.0000 | MEDICATED_PATCH | Freq: Every day | CUTANEOUS | 0 refills | Status: DC
Start: 2022-01-12 — End: 2022-02-24

## 2022-01-12 NOTE — Telephone Encounter (Signed)
RX sent to pharmacy.    Richardson Dopp, DO  01/12/2022, 10:02

## 2022-01-12 NOTE — Telephone Encounter (Signed)
Patients mother called in again inquiring about the lidocaine patches.    Macky Lower, RN  [facility]  01/12/2022, 09:35

## 2022-02-06 ENCOUNTER — Encounter (INDEPENDENT_AMBULATORY_CARE_PROVIDER_SITE_OTHER): Payer: Self-pay | Admitting: Student in an Organized Health Care Education/Training Program

## 2022-02-09 ENCOUNTER — Ambulatory Visit (HOSPITAL_BASED_OUTPATIENT_CLINIC_OR_DEPARTMENT_OTHER): Payer: Self-pay | Admitting: Podiatrist

## 2022-02-24 ENCOUNTER — Other Ambulatory Visit (INDEPENDENT_AMBULATORY_CARE_PROVIDER_SITE_OTHER): Payer: Self-pay | Admitting: Student in an Organized Health Care Education/Training Program

## 2022-02-24 DIAGNOSIS — M79673 Pain in unspecified foot: Secondary | ICD-10-CM

## 2022-03-14 ENCOUNTER — Encounter (INDEPENDENT_AMBULATORY_CARE_PROVIDER_SITE_OTHER): Payer: Medicaid Other | Admitting: Student in an Organized Health Care Education/Training Program

## 2022-03-21 ENCOUNTER — Ambulatory Visit: Payer: Medicaid Other | Attending: Student in an Organized Health Care Education/Training Program

## 2022-03-21 ENCOUNTER — Ambulatory Visit (INDEPENDENT_AMBULATORY_CARE_PROVIDER_SITE_OTHER): Payer: Medicaid Other | Admitting: Student in an Organized Health Care Education/Training Program

## 2022-03-21 ENCOUNTER — Other Ambulatory Visit: Payer: Self-pay

## 2022-03-21 VITALS — BP 140/90 | HR 88 | Temp 96.9°F | Resp 16 | Wt 189.4 lb

## 2022-03-21 DIAGNOSIS — Z6828 Body mass index (BMI) 28.0-28.9, adult: Secondary | ICD-10-CM

## 2022-03-21 DIAGNOSIS — M545 Low back pain, unspecified: Secondary | ICD-10-CM

## 2022-03-21 LAB — COMPREHENSIVE METABOLIC PANEL, NON-FASTING
ALBUMIN: 3.9 g/dL (ref 3.5–5.0)
ALKALINE PHOSPHATASE: 68 U/L (ref 40–110)
ALT (SGPT): 11 U/L (ref 8–22)
ANION GAP: 8 mmol/L (ref 4–13)
AST (SGOT): 14 U/L (ref 8–45)
BILIRUBIN TOTAL: 0.3 mg/dL (ref 0.3–1.3)
BUN/CREA RATIO: 19 (ref 6–22)
BUN: 13 mg/dL (ref 8–25)
CALCIUM: 9.3 mg/dL (ref 8.5–10.0)
CHLORIDE: 108 mmol/L (ref 96–111)
CO2 TOTAL: 21 mmol/L — ABNORMAL LOW (ref 22–30)
CREATININE: 0.69 mg/dL (ref 0.60–1.05)
ESTIMATED GFR: >90 mL/min/BSA (ref >=60)
GLUCOSE: 85 mg/dL (ref 65–125)
POTASSIUM: 3.8 mmol/L (ref 3.5–5.1)
PROTEIN TOTAL: 7.3 g/dL (ref 6.4–8.3)
SODIUM: 137 mmol/L (ref 136–145)

## 2022-03-21 MED ORDER — DICLOFENAC SODIUM 25 MG TABLET,DELAYED RELEASE
25.0000 mg | DELAYED_RELEASE_TABLET | Freq: Two times a day (BID) | ORAL | 0 refills | Status: AC
Start: 2022-03-21 — End: 2022-04-20

## 2022-03-21 NOTE — Progress Notes (Signed)
FAMILY MEDICINE, [facility] MEDICAL OFFICE BUILDING  [address]  [place] [address]    Acute Visit     Name: Ellen Dillon MRN:  [id number]   Date: 03/21/2022 Age: 41 y.o.         Chief Complaint(s):   Chief Complaint   Patient presents with    Low Back Pain       SUBJECTIVE:  Ellen Dillon is a 41 y.o. female presenting today with acute complaint of low back pain x years but has worsened x 4 months. Patient states back has really hurt ever since she had an epidural. Now patient states pain is worse and severe in the mornings when she first gets up. Pain is also worsening by prolonged sitting. Worsened by bending. Otherwise not affected by walking/standing. Pain radiates down the legs, but she denies numbness/tingling/weakness. Patient denies known injury but related her back pain to her epidural. Pain is bilateral low back; she has been using lidocaine patches. She takes [employer] Body aspirin a few times a day, which helps.      Review of Systems   Musculoskeletal: Positive for back pain. Negative for joint pain and myalgias.   Neurological: Negative for tingling, sensory change and focal weakness.       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    Overweight(278.02)     Panic attack     Seizure (CMS [facility])     age 79     Tobacco smoking complicating pregnancy, third trimester 02/27/2019           Medications:  Current Outpatient  Medications   Medication Sig    buprenorphine-naloxone (SUBOXONE) 8-2 mg Sublingual Tablet, Sublingual     diclofenac sodium (VOLTAREN) 25 mg Oral Tablet, Delayed Release (E.C.) Take 1 Tablet (25 mg total) by mouth Twice daily for 30 days    hydrOXYzine HCL (ATARAX) 25 mg Oral Tablet Take 1 Tablet (25 mg total) by mouth Every 8 hours as needed for Anxiety    lidocaine (LIDODERM) 5 % Adhesive Patch, Medicated PLACE ONE (1) PATCH  ON THE SKIN ONCE A DAY FOR 14 DAYS    naloxone (NARCAN) 4 mg per spray nasal spray 1 Spray by INTRANASAL route Every 2 minutes as needed (for overdose)        Allergies:  Allergies   Allergen Reactions    Penicillins      HIVES    Propoxyphene      HIVES    Sulfa (Sulfonamides)      HIVES    Suboxone [Buprenorphine-Naloxone]  Other Adverse Reaction (Add comment)     **Only the strips**  Gives stroke level bp    Tramadol Swelling        Social History:  Social History     Tobacco Use    Smoking status: Every Day     Packs/day: 0.50     Years:  14.00     Pack years: 7.00     Types: Cigarettes    Smokeless tobacco: Never   Substance Use Topics    Alcohol use: No    Drug use: No     Types: Other     Comment: denies/history of opiate pills and cocaine        Family History:  Family Medical History:     Problem Relation (Age of Onset)    Diabetes Maternal Grandmother, Maternal Grandfather    Healthy Father, Mother, Brother            OBJECTIVE:  BP (!) 140/90    Pulse 88    Temp 36.1 C (96.9 F)    Resp 16    Wt 85.9 kg (189 lb 6.4 oz)    BMI 28.80 kg/m         Physical Exam  Constitutional:       General: She is not in acute distress.  HENT:      Head: Normocephalic and atraumatic.   Eyes:      Conjunctiva/sclera: Conjunctivae normal.   Pulmonary:      Effort: Pulmonary effort is normal. No respiratory distress.   Musculoskeletal:      Lumbar back: Tenderness present. No swelling, deformity, spasms or bony tenderness. Normal range of motion.   Neurological:      Mental Status: She  is alert and oriented to person, place, and time.   Psychiatric:         Mood and Affect: Mood and affect normal.         Cognition and Memory: Memory normal.         Judgment: Judgment normal.           ASSESSMENT and PLAN:    ICD-10-CM    1. Low back pain  M54.50 diclofenac sodium (VOLTAREN) 25 mg Oral Tablet, Delayed Release (E.C.)     COMPREHENSIVE METABOLIC PANEL, NON-FASTING     XR LUMBAR SPINE W/FLEX EXTENSION        Rx'd diclofenac, advised against OTC NSAIDs with this but tylenol okay  Discussed mattress support at night  Will get lumbar Xray  PT or PMR based on Xray  Possible CT/MRI    RTC PRN    Richardson Dopp, DO  03/21/2022, 11:28

## 2022-03-21 NOTE — Nursing Note (Signed)
Chief Complaint:   Chief Complaint            Low Back Pain         Functional Health Screen        Vital Signs  BP (!) 140/90    Pulse 88    Temp 36.1 C (96.9 F)    Resp 16    Wt 85.9 kg (189 lb 6.4 oz)    BMI 28.80 kg/m       Social History     Tobacco Use   Smoking Status Every Day    Packs/day: 0.50    Years: 14.00    Pack years: 7.00    Types: Cigarettes   Smokeless Tobacco Never     Allergies  Allergies   Allergen Reactions    Penicillins      HIVES    Propoxyphene      HIVES    Sulfa (Sulfonamides)      HIVES    Suboxone [Buprenorphine-Naloxone]  Other Adverse Reaction (Add comment)     **Only the strips**  Gives stroke level bp    Tramadol Swelling     Medication History  Reviewed for OTC medication and any new medications, provider will review medication history  Care Team  Patient Care Team:  Christ Kick, DO as PCP - General (FAMILY MEDICINE)  Immunizations - last 24 hours     None        [place], [place]  03/21/2022, 11:16

## 2022-05-01 ENCOUNTER — Ambulatory Visit (HOSPITAL_BASED_OUTPATIENT_CLINIC_OR_DEPARTMENT_OTHER): Payer: Self-pay | Admitting: Podiatrist

## 2022-05-31 ENCOUNTER — Encounter (INDEPENDENT_AMBULATORY_CARE_PROVIDER_SITE_OTHER): Payer: Self-pay | Admitting: Student in an Organized Health Care Education/Training Program

## 2022-07-24 ENCOUNTER — Telehealth (HOSPITAL_COMMUNITY): Payer: Self-pay | Admitting: Student in an Organized Health Care Education/Training Program

## 2022-07-24 NOTE — Telephone Encounter (Signed)
Patient called with c/o back pain x 3 years, has been worse x 6 months. Patient states she has been taking 800mg  motrin every morning, usually has to take another 800mg  of motrin at night. Patient has appt 08/31/22 and is asking for something for pain to take until then.   , LPN  [facility]  07/24/2022, 13:18

## 2022-07-25 ENCOUNTER — Other Ambulatory Visit (INDEPENDENT_AMBULATORY_CARE_PROVIDER_SITE_OTHER): Payer: Self-pay | Admitting: Student in an Organized Health Care Education/Training Program

## 2022-07-25 DIAGNOSIS — M79673 Pain in unspecified foot: Secondary | ICD-10-CM

## 2022-07-28 ENCOUNTER — Ambulatory Visit (INDEPENDENT_AMBULATORY_CARE_PROVIDER_SITE_OTHER): Payer: Medicaid Other | Admitting: GENERAL

## 2022-07-28 ENCOUNTER — Other Ambulatory Visit: Payer: Self-pay

## 2022-07-28 ENCOUNTER — Encounter (INDEPENDENT_AMBULATORY_CARE_PROVIDER_SITE_OTHER): Payer: Self-pay | Admitting: GENERAL

## 2022-07-28 VITALS — BP 154/111 | HR 74 | Temp 97.1°F | Ht 68.0 in | Wt 184.6 lb

## 2022-07-28 DIAGNOSIS — G8929 Other chronic pain: Secondary | ICD-10-CM

## 2022-07-28 DIAGNOSIS — M545 Low back pain, unspecified: Secondary | ICD-10-CM

## 2022-07-28 DIAGNOSIS — Z6828 Body mass index (BMI) 28.0-28.9, adult: Secondary | ICD-10-CM

## 2022-07-28 MED ORDER — KETOROLAC 10 MG TABLET
10.0000 mg | ORAL_TABLET | Freq: Four times a day (QID) | ORAL | 0 refills | Status: DC | PRN
Start: 2022-07-28 — End: 2024-05-01

## 2022-07-28 NOTE — Progress Notes (Signed)
FAMILY MEDICINE, [facility] MEDICAL OFFICE BUILDING  [address]  [place] [address]    Progress Note    Name: Ellen Dillon MRN:  [id number]   Date: 07/28/2022 Age: 41 y.o.       Assessment: Plan:     Ellen Dillon is a 41 y.o. female here for acute on chronic low back pain.    1. Acute midline low back pain without sciatica (Primary)  2. Chronic midline low back pain without sciatica  -patient previously been ordered x-ray but did not undergo imaging, will reorder this and also refer patient to PT given chronicity of issue  -will also order NSAID for treatment for short-term and did advised patient to hold ibuprofen while taking this medication.  -     XR LUMBAR SPINE W FLEX/EXT/SIDE BENDINGS; Future  -     Refer to Physical Therapy-External; Future  -     ketorolac tromethamine (TORADOL) 10 mg Oral Tablet; Take 1 Tablet (10 mg total) by mouth Every 6 hours as needed for Pain          Follow up:  P.r.n. Symptoms fail to improve recent    Coding Level:  E4    Subjective:   CC:   Chief Complaint   Patient presents with    Back Pain      Constant lower back pain, happens daily         Ellen Dillon is a 41 y.o. female here for Back Pain ( Constant lower back pain, happens daily  )  .   Today acute on chronic low back pain.  She reports that it has been worsened over the last 3 weeks and chronic back pain has been ongoing for at least 3 years.  Denies any injuries or weakness.  Denies any concerning symptoms including neurologic changes.    Current Outpatient Medications   Medication Sig    buprenorphine-naloxone (SUBOXONE) 8-2 mg Sublingual Tablet, Sublingual     ketorolac tromethamine (TORADOL) 10 mg Oral Tablet Take 1 Tablet (10 mg total) by mouth Every 6 hours as needed for Pain    lidocaine (LIDODERM) 5 % Adhesive Patch, Medicated PLACE ONE (1) PATCH ON THE SKIN ONCE A DAY FOR 14 DAYS    naloxone (NARCAN) 4 mg per spray nasal spray 1 Spray by INTRANASAL route  Every 2 minutes as needed (for overdose)     Allergies   Allergen Reactions    Penicillins      HIVES    Propoxyphene      HIVES    Sulfa (Sulfonamides)      HIVES    Suboxone [Buprenorphine-Naloxone]  Other Adverse Reaction (Add comment)     **Only the strips**  Gives stroke level bp    Tramadol Swelling     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    Overweight(278.02)     Panic attack     Seizure (CMS  [facility])     age 89     Tobacco smoking complicating pregnancy, third trimester 02/27/2019         Past Surgical History:   Procedure Laterality Date    HX BREAST BIOPSY      x3 cyst     HX CYST INCISION AND DRAINAGE  LEFT    HX DILATION AND CURETTAGE  01/04/12     Dilation and Curettage suction          Family Medical History:       Problem Relation (Age of Onset)    Diabetes Maternal Grandmother, Maternal Grandfather    Healthy Father, Mother, Brother            Social History     Tobacco Use   Smoking Status Every Day    Packs/day: 0.50    Years: 14.00    Pack years: 7.00    Types: Cigarettes   Smokeless Tobacco Never      Social History     Substance and Sexual Activity   Alcohol Use No          Review of Systems   Constitutional:  Negative for chills, fever and malaise/fatigue.   Musculoskeletal:  Positive for back pain and myalgias. Negative for falls.   Skin:  Negative for rash.   Neurological:  Negative for sensory change and weakness.       Objective:     BP (!) 154/111   Pulse 74   Temp 36.2 C (97.1 F)    Ht 1.727 m (5\' 8" )   Wt 83.7 kg (184 lb 9.6 oz)   SpO2 99%   BMI 28.07 kg/m       Body mass index is 28.07 kg/m.       No LMP recorded.    Vital signs were obtained by nurse/MA/staff: received & reviewed personally by me during patient encounter.  , MD, 07/28/2022, 16:43    Physical Exam  HENT:      Head: Normocephalic and atraumatic.   Musculoskeletal:         General: Tenderness present.   Neurological:      Mental Status: She is alert and oriented to person, place, and time.      Gait: Gait is intact.   Psychiatric:         Mood and Affect: Affect normal.         Judgment: Judgment normal.       I have personally reviewed and/or updated all pertinent patient records during this encounter.    09/27/2022, MD      Tennis Must, MD  [facility] OFFICE [facility]  FAMILY MEDICINE, [facility] OFFICE BUILDING  [address]  [place]  Dept: [phone]  Dept Fax: [phone]  Loc: [phone]  Loc Fax: [phone]

## 2022-07-28 NOTE — Nursing Note (Signed)
Chief Complaint:   Chief Complaint              Back Pain  Constant lower back pain, happens daily            Functional Health Screen        Vital Signs  BP (!) 154/111   Pulse 74   Temp 36.2 C (97.1 F)   Ht 1.727 m (5\' 8" )   Wt 83.7 kg (184 lb 9.6 oz)   SpO2 99%   BMI 28.07 kg/m       Social History     Tobacco Use   Smoking Status Every Day    Packs/day: 0.50    Years: 14.00    Pack years: 7.00    Types: Cigarettes   Smokeless Tobacco Never     Allergies  Allergies   Allergen Reactions    Penicillins      HIVES    Propoxyphene      HIVES    Sulfa (Sulfonamides)      HIVES    Suboxone [Buprenorphine-Naloxone]  Other Adverse Reaction (Add comment)     **Only the strips**  Gives stroke level bp    Tramadol Swelling     Medication History  Reviewed for OTC medication and any new medications, provider will review medication history  Care Team  Patient Care Team:  , DO as PCP - General (FAMILY MEDICINE)  Immunizations - last 24 hours       None          Ellen Dillon  07/28/2022, 16:26

## 2022-07-31 ENCOUNTER — Telehealth (HOSPITAL_COMMUNITY): Payer: Self-pay | Admitting: Student in an Organized Health Care Education/Training Program

## 2022-07-31 ENCOUNTER — Other Ambulatory Visit (INDEPENDENT_AMBULATORY_CARE_PROVIDER_SITE_OTHER): Payer: Self-pay | Admitting: GENERAL

## 2022-07-31 DIAGNOSIS — I10 Essential (primary) hypertension: Secondary | ICD-10-CM

## 2022-07-31 MED ORDER — LOSARTAN 50 MG TABLET
50.0000 mg | ORAL_TABLET | Freq: Every day | ORAL | 0 refills | Status: DC
Start: 2022-07-31 — End: 2024-05-01

## 2022-07-31 NOTE — Telephone Encounter (Signed)
The patient called stating that her blood pressure has been elevated since her appointment on 07/28/22 with Dr. Zadie Rhine. At the appointment was 156/111. The patient has been taking it over the weekend and her readings were 146/100 and 156/101. The patient states that she has been having an off and on headache. She denies chest pain, weakness, blurred vision, or dizziness. The patient would like to know if Dr. Janee Morn has any recommendations. Advised patient that a message will be sent, however if she develops any chest pain, weakness, blurred vision, or severe headache, she needs to go the emergency room for evaluation. Patient states understanding.      Macky Lower, RN  [facility]  07/31/2022, 15:05

## 2022-08-01 ENCOUNTER — Encounter (HOSPITAL_COMMUNITY): Payer: Self-pay

## 2022-08-01 NOTE — Telephone Encounter (Signed)
Attempted to call patient to go over Dr. Gerhard Munch message/instructions, however no answer and the voicemail has not been set up.      Macky Lower, RN  [facility]  08/01/2022, 08:14

## 2022-08-04 NOTE — Telephone Encounter (Signed)
Pts mother called with pt on the line as well, stating pts BP continues to run really high. Advised pt that Dr. Zadie Rhine sent in losartan to her pharmacy on Monday and a nurse tried to call her. She will go pick up script today and call on Monday or Tuesday to advise how her BP readings are.  Vladimir Faster, centralized scheduler  [facility]  08/04/2022, 12:09

## 2022-08-08 ENCOUNTER — Telehealth (HOSPITAL_COMMUNITY): Payer: Self-pay | Admitting: Student in an Organized Health Care Education/Training Program

## 2022-08-08 DIAGNOSIS — M549 Dorsalgia, unspecified: Secondary | ICD-10-CM

## 2022-08-08 NOTE — Telephone Encounter (Signed)
The patients mother called requesting a referral for the patient be sent [facility] for her back pain. Per mom this was discussed with PCP.      Macky Lower, RN  [facility]  08/08/2022, 13:20

## 2022-08-09 ENCOUNTER — Encounter (INDEPENDENT_AMBULATORY_CARE_PROVIDER_SITE_OTHER): Payer: Self-pay

## 2022-08-09 NOTE — Telephone Encounter (Signed)
Discussed at last appt. Referral signed.    Richardson Dopp, DO 08/09/2022, 09:11

## 2022-08-31 ENCOUNTER — Encounter (INDEPENDENT_AMBULATORY_CARE_PROVIDER_SITE_OTHER): Payer: Self-pay | Admitting: Student in an Organized Health Care Education/Training Program

## 2023-01-04 ENCOUNTER — Ambulatory Visit (INDEPENDENT_AMBULATORY_CARE_PROVIDER_SITE_OTHER): Payer: Self-pay | Admitting: Student in an Organized Health Care Education/Training Program

## 2023-01-16 ENCOUNTER — Encounter (INDEPENDENT_AMBULATORY_CARE_PROVIDER_SITE_OTHER): Payer: Self-pay | Admitting: Student in an Organized Health Care Education/Training Program

## 2023-01-16 ENCOUNTER — Ambulatory Visit
Payer: Medicaid Other | Attending: Student in an Organized Health Care Education/Training Program | Admitting: Student in an Organized Health Care Education/Training Program

## 2023-01-16 ENCOUNTER — Other Ambulatory Visit: Payer: Self-pay

## 2023-01-16 VITALS — BP 132/80 | HR 77 | Temp 97.7°F | Wt 194.0 lb

## 2023-01-16 DIAGNOSIS — Z131 Encounter for screening for diabetes mellitus: Secondary | ICD-10-CM | POA: Insufficient documentation

## 2023-01-16 DIAGNOSIS — Z0000 Encounter for general adult medical examination without abnormal findings: Secondary | ICD-10-CM | POA: Insufficient documentation

## 2023-01-16 DIAGNOSIS — Z13220 Encounter for screening for lipoid disorders: Secondary | ICD-10-CM | POA: Insufficient documentation

## 2023-01-16 DIAGNOSIS — R232 Flushing: Secondary | ICD-10-CM | POA: Insufficient documentation

## 2023-01-16 DIAGNOSIS — M545 Low back pain, unspecified: Secondary | ICD-10-CM | POA: Insufficient documentation

## 2023-01-16 MED ORDER — AMITRIPTYLINE 50 MG TABLET
50.0000 mg | ORAL_TABLET | Freq: Every evening | ORAL | 0 refills | Status: DC
Start: 2023-01-16 — End: 2024-05-01

## 2023-01-16 NOTE — Nursing Note (Signed)
Chief Complaint:   Chief Complaint              Physical           Functional Health Screen        Vital Signs  BP 132/80   Pulse 77   Temp 36.5 C (97.7 F)   Wt 88 kg (194 lb)   BMI 29.50 kg/m       Social History     Tobacco Use   Smoking Status Every Day    Packs/day: 0.50    Years: 14.00    Additional pack years: 0.00    Total pack years: 7.00    Types: Cigarettes   Smokeless Tobacco Never     Allergies  Allergies   Allergen Reactions    Penicillins      HIVES    Propoxyphene      HIVES    Sulfa (Sulfonamides)      HIVES    Suboxone [Buprenorphine-Naloxone]  Other Adverse Reaction (Add comment)     **Only the strips**  Gives stroke level bp    Tramadol Swelling     Medication History  Reviewed for OTC medication and any new medications, provider will review medication history  Care Team  Patient Care Team:  Christ Kick, DO as PCP - General (FAMILY MEDICINE)  Immunizations - last 24 hours       None          [place], [place]  01/16/2023, 13:25

## 2023-01-16 NOTE — Progress Notes (Signed)
FAMILY MEDICINE, [facility] OFFICE BUILDING  [address]  [place] [address]  Operated by [facility]     Name: Ellen Dillon MRN:  [id number]   Date: 01/16/2023 Age: 42 y.o.       Chief Concern:    Chief Complaint   Patient presents with    Physical       History of present illness:   Ellen Dillon is a 42 y.o. here for "physical" with some concerns:    Hot flashes x 1 year; past 6 months worse, occurring every 1-2 hours, day and night. Regular menses. Has had some mood swings. She is mostly concerned because it is disrupting her sleep so much.    Continues with low back pain; PT increased her pain; is on Suboxone therapy and cannot really have other prescription medications. Is interested in massage therapy and/or chiropractic treatment.    Review of Systems   Constitutional:  Positive for malaise/fatigue. Negative for chills and fever.   HENT:  Negative for congestion, ear pain and sore throat.    Respiratory:  Negative for cough, shortness of breath and wheezing.    Cardiovascular:  Negative for chest pain, palpitations and leg swelling.   Gastrointestinal:  Negative for abdominal pain, nausea and vomiting.   Genitourinary:  Negative for dysuria, frequency and urgency.   Musculoskeletal:  Positive for back pain. Negative for joint pain and neck pain.   Skin:  Negative for rash.   Neurological:  Negative for dizziness, focal weakness and headaches.   Endo/Heme/Allergies:         +hot flashes   Psychiatric/Behavioral:  Negative for depression and substance abuse. The patient is not nervous/anxious.         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    History of pregnancy induced hypertension 02/27/2019    Anxiety     Hx of induced abortion      X 2         Past Surgical History:   Past Surgical History:   Procedure Laterality Date    DILATION AND SUCTION CURETTAGE N/A 01/04/2012    Performed by Morrell Riddle, DO at [facility] OR INVENTORY (OLD [facility] OR LOCATION)    HX BREAST BIOPSY      x3 cyst     HX CYST INCISION AND DRAINAGE  LEFT    HX DILATION AND CURETTAGE  01/04/12     Dilation and Curettage suction        Medications:   Current Outpatient Medications   Medication Sig    amitriptyline (ELAVIL) 50 mg Oral Tablet Take 1 Tablet (50 mg total) by mouth Every night    buprenorphine-naloxone (SUBOXONE) 8-2 mg Sublingual Tablet, Sublingual     ketorolac tromethamine (TORADOL) 10 mg Oral Tablet Take 1 Tablet (10 mg total) by mouth Every 6 hours as needed for Pain    lidocaine (LIDODERM) 5 % Adhesive Patch, Medicated PLACE ONE (1) PATCH  ON THE SKIN ONCE A DAY FOR 14 DAYS    losartan (COZAAR) 50 mg Oral Tablet Take 1 Tablet (50 mg total) by mouth Once a day    naloxone (NARCAN) 4 mg per spray nasal spray 1 Spray by INTRANASAL route Every 2 minutes as needed (for overdose)        Allergies:   Allergies   Allergen Reactions    Penicillins      HIVES    Propoxyphene      HIVES    Sulfa (Sulfonamides)      HIVES    Suboxone [Buprenorphine-Naloxone]  Other Adverse Reaction (Add comment)     **Only the strips**  Gives stroke level bp    Tramadol Swelling        Family History:   Family Medical History:       Problem Relation (Age of Onset)    Diabetes Maternal Grandmother, Maternal Grandfather    Healthy Father, Mother, Brother              Social History:   Social History     Socioeconomic History    Marital status: Single    Number of children: 0    Years of education: 12.5   Tobacco Use    Smoking status: Every Day     Packs/day: 0.50     Years: 14.00      Additional pack years: 0.00     Total pack years: 7.00     Types: Cigarettes    Smokeless tobacco: Never   Vaping Use    Vaping Use: Never used   Substance and Sexual Activity    Alcohol use: Yes    Drug use: No     Types: Other     Comment: denies/history of opiate pills and cocaine    Sexual activity: Yes     Partners: Male   Other Topics Concern    Ability to Walk 2 Flight of Steps without SOB/CP Yes    Ability To Do Own ADL's Yes       Physical Exam:  BP 132/80   Pulse 77   Temp 36.5 C (97.7 F)   Wt 88 kg (194 lb)   BMI 29.50 kg/m          Physical Exam  Constitutional:       General: She is not in acute distress.  HENT:      Head: Normocephalic and atraumatic.   Eyes:      Conjunctiva/sclera: Conjunctivae normal.   Cardiovascular:      Rate and Rhythm: Normal rate and regular rhythm.   Pulmonary:      Effort: Pulmonary effort is normal. No respiratory distress.      Breath sounds: Wheezing present.   Neurological:      General: No focal deficit present.      Mental Status: She is alert.   Psychiatric:         Mood and Affect: Mood and affect normal.         Cognition and Memory: Memory normal.         Judgment: Judgment normal.            Health Maintenance:   Blood pressure screen: 132/80        Pap: UTD                   Lipid screen: due  DM screen: due      Assessment and Plan:     ICD-10-CM    1. Lumbago  M54.50 AMB CONSULT/REFERRAL MASSAGE THERAPY-EXT      2. Hot flashes  R23.2 THYROID STIMULATING HORMONE WITH FREE T4 REFLEX     ESTROGENS, FRACTIONATED, LC/MS     PROGESTERONE     FSH     LH      3. Screening for diabetes mellitus  Z13.1       4. Screening for hyperlipidemia  Z13.220 LIPID PANEL      5. Annual physical exam  Z00.00 COMPREHENSIVE METABOLIC PNL, FASTING     CBC/DIFF     LIPID PANEL        Will check labs  Start elavil 50 mg nightly  Discussed possibility of patient having massage therapy partially covered by insurance due to her back pain worsened by PT and patient  unable to take most prescription medications.      Christ Kick, DO  01/16/2023, 13:33

## 2023-01-23 ENCOUNTER — Encounter: Admit: 2023-01-23 | Discharge: 2023-01-23 | Payer: PRIVATE HEALTH INSURANCE

## 2023-02-16 ENCOUNTER — Ambulatory Visit: Payer: Medicaid Other | Attending: GENERAL | Admitting: GENERAL

## 2023-02-16 ENCOUNTER — Other Ambulatory Visit: Payer: Self-pay

## 2023-02-16 VITALS — BP 138/88 | HR 88 | Temp 97.3°F | Wt 212.0 lb

## 2023-02-16 DIAGNOSIS — R220 Localized swelling, mass and lump, head: Secondary | ICD-10-CM | POA: Insufficient documentation

## 2023-02-16 DIAGNOSIS — L03211 Cellulitis of face: Secondary | ICD-10-CM | POA: Insufficient documentation

## 2023-02-16 MED ORDER — DIPHENHYDRAMINE 25 MG TABLET
50.0000 mg | ORAL_TABLET | Freq: Once | ORAL | Status: AC
Start: 2023-02-16 — End: 2023-02-16
  Administered 2023-02-16: 50 mg via ORAL

## 2023-02-16 MED ORDER — CLINDAMYCIN HCL 300 MG CAPSULE
300.0000 mg | ORAL_CAPSULE | Freq: Four times a day (QID) | ORAL | 0 refills | Status: AC
Start: 2023-02-16 — End: 2023-02-23

## 2023-02-16 NOTE — Nursing Note (Unsigned)
Chief Complaint:   Chief Complaint              Facial Swelling           Functional Health Screen        Vital Signs  BP 138/88   Pulse 88   Temp 36.3 C (97.3 F)   Wt 96.2 kg (212 lb)   BMI 32.23 kg/m       Social History     Tobacco Use   Smoking Status Every Day    Current packs/day: 0.50    Average packs/day: 0.5 packs/day for 14.0 years (7.0 ttl pk-yrs)    Types: Cigarettes   Smokeless Tobacco Never     Allergies  Allergies   Allergen Reactions    Penicillins      HIVES    Propoxyphene      HIVES    Sulfa (Sulfonamides)      HIVES    Suboxone [Buprenorphine-Naloxone]  Other Adverse Reaction (Add comment)     **Only the strips**  Gives stroke level bp    Tramadol Swelling     Medication History  Reviewed for OTC medication and any new medications, provider will review medication history  Care Team  Patient Care Team:  Christ Kick, DO as PCP - General ([place])  Immunizations - last 24 hours       None          [place], [place]  02/16/2023, 11:21

## 2023-02-16 NOTE — Progress Notes (Unsigned)
FAMILY MEDICINE, [facility] OFFICE BUILDING  [place] [address]    Progress Note    Name: Ellen Dillon MRN:  [id number]   Date: 02/16/2023 Age: 42 y.o.       Assessment: Plan:   {Tobacco cessation counseling:21019580}      Ellen Dillon is a 42 y.o. female here for ***    There are no diagnoses linked to this encounter.      Follow up: ***    Coding Level: ***    Subjective:   CC:   Chief Complaint   Patient presents with    Facial Swelling       Ellen Dillon is a 42 y.o. female here for Facial Swelling  .   ***    Current Outpatient Medications   Medication Sig    amitriptyline (ELAVIL) 50 mg Oral Tablet Take 1 Tablet (50 mg total) by mouth Every night    buprenorphine-naloxone (SUBOXONE) 8-2 mg Sublingual Tablet, Sublingual     ketorolac tromethamine (TORADOL) 10 mg Oral Tablet Take 1 Tablet (10 mg total) by mouth Every 6 hours as needed for Pain    lidocaine (LIDODERM) 5 % Adhesive Patch, Medicated PLACE ONE (1) PATCH ON THE SKIN ONCE A DAY FOR 14 DAYS    losartan (COZAAR) 50 mg Oral Tablet Take 1 Tablet (50 mg total) by mouth Once a day    naloxone (NARCAN) 4 mg per spray nasal spray 1 Spray by INTRANASAL route Every 2 minutes as needed (for overdose)     Allergies   Allergen Reactions    Penicillins      HIVES    Propoxyphene      HIVES    Sulfa (Sulfonamides)      HIVES    Suboxone [Buprenorphine-Naloxone]  Other Adverse Reaction (Add comment)     **Only the strips**  Gives stroke level bp    Tramadol Swelling     Past Medical History:   Diagnosis Date    Anxiety     Diet controlled White classification A1 gestational diabetes mellitus (GDM) 08/07/2019    Gestational hypertension 09/01/2019    Gestational hypertension, third trimester 08/07/2019    High-risk pregnancy in third trimester 07/17/2019    Hx of spontaneous abortion, currently pregnant, third trimester     Hypertension     due to pregnancy    Multigravida of advanced maternal age  in third trimester 07/17/2019    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     Opiate abuse, 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    Overweight(278.02)     Panic attack     Seizure (CMS [facility])     age 30     Tobacco smoking complicating pregnancy, third trimester 02/27/2019         Past Surgical History:   Procedure Laterality Date    HX BREAST BIOPSY      x3 cyst     HX CYST INCISION AND DRAINAGE  LEFT    HX DILATION AND CURETTAGE  01/04/12     Dilation and Curettage suction          Family Medical History:       Problem Relation (Age of Onset)    Diabetes Maternal Grandmother, Maternal Grandfather    Healthy Father, Mother, Brother            Social History     Tobacco Use   Smoking Status Every Day    Current packs/day: 0.50  Average packs/day: 0.5 packs/day for 14.0 years (7.0 ttl pk-yrs)    Types: Cigarettes   Smokeless Tobacco Never      Social History     Substance and Sexual Activity   Alcohol Use Yes          ROS      Objective:     BP 138/88   Pulse 88   Temp 36.3 C (97.3 F)   Wt 96.2 kg (212 lb)   BMI 32.23 kg/m       Body mass index is 32.23 kg/m.       No LMP recorded.    Vital signs were obtained by nurse/MA/staff: received & reviewed personally by me during patient encounter.  Hiram Comber, MD, 02/16/2023, 11:24    Physical Exam    I have personally reviewed and/or updated all pertinent patient records during this encounter.    Hiram Comber, MD      Hiram Comber, MD  [place], [facility] OFFICE BUILDING  [address]  [place] [address]  Dept: [phone]  Dept Fax: [phone]  Loc: [place] Fax: [phone]

## 2023-02-19 ENCOUNTER — Encounter (INDEPENDENT_AMBULATORY_CARE_PROVIDER_SITE_OTHER): Payer: Self-pay | Admitting: GENERAL

## 2023-02-22 ENCOUNTER — Encounter: Admit: 2023-02-22 | Discharge: 2023-02-22 | Payer: PRIVATE HEALTH INSURANCE

## 2023-02-23 ENCOUNTER — Encounter: Admit: 2023-02-23 | Discharge: 2023-02-23 | Payer: PRIVATE HEALTH INSURANCE

## 2023-03-01 ENCOUNTER — Telehealth (HOSPITAL_COMMUNITY): Payer: Self-pay | Admitting: Student in an Organized Health Care Education/Training Program

## 2023-03-01 NOTE — Telephone Encounter (Signed)
The patient called stating that she has to take weekly drug tests with her Suboxone clinic and the amitriptyline that she is prescribed is flagging as a barbiturate. The patient is going to call her Suboxone clinic to see if the will accept a letter stating she is on the amitriptyline and call back.      Lendon Collar, Clar[place]  03/01/2023, 13:00

## 2023-04-10 ENCOUNTER — Encounter: Admit: 2023-04-10 | Discharge: 2023-04-10 | Payer: PRIVATE HEALTH INSURANCE

## 2023-04-10 NOTE — Telephone Encounter
04/10/2023 - Records received from [place], [place] are scanned into chart/bmw

## 2023-04-16 ENCOUNTER — Encounter: Admit: 2023-04-16 | Discharge: 2023-04-16 | Payer: PRIVATE HEALTH INSURANCE

## 2023-04-17 ENCOUNTER — Encounter: Admit: 2023-04-17 | Discharge: 2023-04-17 | Payer: PRIVATE HEALTH INSURANCE

## 2023-04-17 DIAGNOSIS — I2510 Atherosclerotic heart disease of native coronary artery without angina pectoris: Secondary | ICD-10-CM

## 2023-04-17 DIAGNOSIS — M549 Dorsalgia, unspecified: Secondary | ICD-10-CM

## 2023-04-17 DIAGNOSIS — E785 Hyperlipidemia, unspecified: Secondary | ICD-10-CM

## 2023-04-17 DIAGNOSIS — R9431 Abnormal electrocardiogram [ECG] [EKG]: Secondary | ICD-10-CM

## 2023-04-17 DIAGNOSIS — J349 Unspecified disorder of nose and nasal sinuses: Secondary | ICD-10-CM

## 2023-04-17 DIAGNOSIS — K929 Disease of digestive system, unspecified: Secondary | ICD-10-CM

## 2023-04-17 DIAGNOSIS — M1990 Unspecified osteoarthritis, unspecified site: Secondary | ICD-10-CM

## 2023-04-17 DIAGNOSIS — F419 Anxiety disorder, unspecified: Secondary | ICD-10-CM

## 2023-04-17 DIAGNOSIS — F32A Depression: Secondary | ICD-10-CM

## 2023-04-17 DIAGNOSIS — I10 Essential (primary) hypertension: Secondary | ICD-10-CM

## 2023-04-17 DIAGNOSIS — E039 Hypothyroidism, unspecified: Secondary | ICD-10-CM

## 2023-04-17 DIAGNOSIS — B009 Herpesviral infection, unspecified: Secondary | ICD-10-CM

## 2023-04-17 DIAGNOSIS — G43909 Migraine, unspecified, not intractable, without status migrainosus: Secondary | ICD-10-CM

## 2023-04-17 DIAGNOSIS — E119 Type 2 diabetes mellitus without complications: Secondary | ICD-10-CM

## 2023-04-17 DIAGNOSIS — Z136 Encounter for screening for cardiovascular disorders: Secondary | ICD-10-CM

## 2023-04-17 DIAGNOSIS — J45909 Unspecified asthma, uncomplicated: Secondary | ICD-10-CM

## 2023-04-17 MED ORDER — ATORVASTATIN 20 MG PO TAB
20 mg | ORAL_TABLET | Freq: Every day | ORAL | 1 refills | Status: AC
Start: 2023-04-17 — End: ?

## 2023-04-17 NOTE — Patient Instructions
Thank you for visiting our office today.    We would like to make the following medication adjustments:      Start Lipitor 20mg  daily  FASTING labs in 3 months       Otherwise continue the same medications as you have been doing.          We will be pursuing the following tests after your appointment today:       Orders Placed This Encounter    LIPID PROFILE    ECG 12-LEAD    2D + DOPPLER ECHO    atorvastatin (LIPITOR) 20 mg tablet         We will plan to see you back in 12 months.  Please call us in the meantime with any questions or concerns.        Please allow 5-7 business days for our providers to review your results. All normal results will go to MyChart. If you do not have Mychart, it is strongly recommended to get this so you can easily view all your results. If you do not have mychart, we will attempt to call you once with normal lab and testing results. If we cannot reach you by phone with normal results, we will send you a letter.  If you have not heard the results of your testing after one week please give [place] a call.       Your Cardiovascular Medicine [place] [facility] Team Brett Canales, Pilar Jarvis, Shawna Orleans, and [place])  phone number is [phone].

## 2023-05-11 ENCOUNTER — Encounter: Admit: 2023-05-11 | Discharge: 2023-05-11 | Payer: PRIVATE HEALTH INSURANCE

## 2023-05-31 ENCOUNTER — Encounter: Admit: 2023-05-31 | Discharge: 2023-05-31 | Payer: PRIVATE HEALTH INSURANCE

## 2023-05-31 NOTE — Telephone Encounter
Received a message from [facility] Scheduling department. The patient was scheduled for an echocardiogram and did not show for testing. They called patient to reschedule, patient did not answer and no vm to leave a message. I also attempted to call patient without success.

## 2023-07-06 ENCOUNTER — Ambulatory Visit (INDEPENDENT_AMBULATORY_CARE_PROVIDER_SITE_OTHER): Payer: Self-pay | Admitting: Student in an Organized Health Care Education/Training Program

## 2023-07-17 ENCOUNTER — Encounter: Admit: 2023-07-17 | Discharge: 2023-07-17 | Payer: PRIVATE HEALTH INSURANCE

## 2023-11-09 ENCOUNTER — Encounter: Admit: 2023-11-09 | Discharge: 2023-11-09 | Payer: PRIVATE HEALTH INSURANCE | Primary: Family

## 2023-11-23 ENCOUNTER — Ambulatory Visit: Admit: 2023-11-23 | Discharge: 2023-11-24 | Payer: MEDICAID | Primary: Family

## 2023-11-26 ENCOUNTER — Encounter: Admit: 2023-11-26 | Discharge: 2023-11-26 | Payer: PRIVATE HEALTH INSURANCE | Primary: Family

## 2023-12-04 ENCOUNTER — Encounter: Admit: 2023-12-04 | Discharge: 2023-12-04 | Payer: PRIVATE HEALTH INSURANCE | Primary: Family

## 2023-12-11 ENCOUNTER — Encounter: Admit: 2023-12-11 | Discharge: 2023-12-11 | Payer: PRIVATE HEALTH INSURANCE | Primary: Family

## 2023-12-11 ENCOUNTER — Ambulatory Visit: Admit: 2023-12-11 | Discharge: 2023-12-12 | Payer: MEDICAID | Primary: Family

## 2024-01-18 NOTE — Progress Notes (Signed)
 Subjective:   Patient ID: Ellen Dillon is a 43 y.o. female    Chief Complaint   Patient presents with   . Cough     Productive cough X 3 wk        43 year old female presents to clinic today for cough x 3 weeks.  Patient reports that the cough is productive and has been associated with shortness of breath and wheezing.  She does have a history of asthma and she does smoke.  No fever/chills.       The following portions of the patient's history were reviewed and updated as appropriate: allergies, current medications, past family history, past medical history, past social history, past surgical history, and problem list.    Past Medical History:   Diagnosis Date   . Anxiety    . Substance abuse      Current Outpatient Medications on File Prior to Visit   Medication Sig Dispense Refill   . buprenorphine -naloxone  (SUBOXONE ) 8-2 MG SL Tab per SL tablet DISSOLVE 2 TABLETS UNDER THE TONGUE DAILY FOR 7 DAYS     . [DISCONTINUED] naloxone  (Narcan ) 4 MG/0.1ML nasal spray        No current facility-administered medications on file prior to visit.     Allergies   Allergen Reactions   . Buprenorphine -Naloxone  Other (See Comments)     **Only the strips**, Gives stroke level bp   . Penicillins      HIVES   . Propoxyphene      HIVES   . Sulfa Antibiotics      HIVES   . Tramadol Swelling     Review of Systems   Constitutional:  Negative for chills and fever.   Respiratory:  Positive for cough, sputum production, shortness of breath and wheezing.    Cardiovascular:  Negative for chest pain.     Objective:     Vitals:    01/18/24 1212   BP: 153/90   Pulse: 80   Resp: 14   Temp: 98.2 F (36.8 C)   SpO2: 99%      Physical Exam  Vitals reviewed.   Constitutional:       General: She is not in acute distress.     Appearance: Normal appearance.   HENT:      Head: Normocephalic and atraumatic.      Right Ear: Tympanic membrane normal.      Left Ear: Tympanic membrane normal.      Mouth/Throat:      Mouth: Mucous membranes are  moist.      Pharynx: Oropharynx is clear. No oropharyngeal exudate or posterior oropharyngeal erythema.   Eyes:      Extraocular Movements: Extraocular movements intact.   Cardiovascular:      Rate and Rhythm: Normal rate and regular rhythm.      Heart sounds: Normal heart sounds.   Pulmonary:      Effort: Pulmonary effort is normal. No respiratory distress.      Breath sounds: Wheezing present. No rhonchi or rales.   Musculoskeletal:         General: Normal range of motion.      Cervical back: Normal range of motion.   Neurological:      Mental Status: She is alert and oriented to person, place, and time.   Psychiatric:         Mood and Affect: Mood normal.         Behavior: Behavior normal.  Assessment and Plan:     Diagnoses and all orders for this visit:    1. Acute bronchitis with asthma  - azithromycin  (Zithromax  Z-Pak) 250 MG tablet; Please take 2 tabs on day 1 and 1 tab daily for 4 more days.  Dispense: 6 tablet; Refill: 0  - methylPREDNISolone (MEDROL DOSEPAK) 4 MG tablet; follow package directions  Dispense: 21 tablet; Refill: 0     - Discussed exam findings with patient.  - Medrol and ZPak ordered for bronchitis. Advised patient to take with food  - Red flags discussed with patient.  - F/u with PCP or return to clinic if symptoms worsen or persist.   - Patient verbalizes understanding and agrees with plan.    Vaughn Georges, NP  [facility]  01/18/2024 12:28 PM

## 2024-03-08 NOTE — Progress Notes (Signed)
 Subjective:    Patient ID: Ellen Dillon is a 43 y.o. female.    Chief Complaint   Patient presents with   . Cough     Fatigue x 1 month          Patient reports cough for at least 1 month.  Fatigue is worsening.  Reports worsening in general over the last week or 2.    Denies pregnancy.  Just had her period this past week.    Her daughter is sick and also being seen today.    Cough  Associated symptoms include myalgias. Pertinent negatives include no fever, headaches or sore throat.       The following portions of the patient's history were reviewed and updated as appropriate: allergies, current medications, past family history, past medical history, past social history, past surgical history, and problem list.      Review of Systems   Constitutional:  Positive for fatigue. Negative for fever.   HENT:  Negative for sore throat.    Respiratory:  Positive for cough.    Gastrointestinal:  Negative for diarrhea.   Genitourinary:  Negative for difficulty urinating.   Musculoskeletal:  Positive for myalgias.   Neurological:  Negative for headaches.       Allergies   Allergen Reactions   . Buprenorphine -Naloxone  Other (See Comments)     **Only the strips**, Gives stroke level bp   . Penicillins      HIVES   . Propoxyphene      HIVES   . Sulfa Antibiotics      HIVES   . Tramadol Swelling       Past Medical History:   Diagnosis Date   . Anxiety    . Substance abuse        Past Surgical History:   Procedure Date   . CYST REMOVAL        Family History   Problem Relation Name Age of Onset   . Diabetes Mother     . No known problems Father             Objective:    BP 146/82   Pulse 63   Temp 98.1 F (36.7 C) (Tympanic)   Resp 18   Ht 1.727 m (5\' 8" )   Wt 85.3 kg (188 lb)   LMP 03/03/2024   SpO2 100%   BMI 28.59 kg/m     Physical Exam  Vitals and nursing note reviewed.   Constitutional:       General: She is not in acute distress.     Appearance: She is well-developed. She is not ill-appearing or  toxic-appearing.   HENT:      Head: Normocephalic.   Eyes:      General:         Right eye: No discharge.         Left eye: No discharge.      Conjunctiva/sclera:      Right eye: Right conjunctiva is not injected.      Left eye: Left conjunctiva is not injected.   Cardiovascular:      Rate and Rhythm: Normal rate and regular rhythm.      Heart sounds: Normal heart sounds.   Pulmonary:      Effort: Pulmonary effort is normal. No tachypnea, bradypnea or respiratory distress.      Breath sounds: Examination of the right-middle field reveals rhonchi. Examination of the left-middle field reveals rhonchi. Examination of the right-lower field reveals rhonchi.  Rhonchi present. No decreased breath sounds, wheezing or rales.   Skin:     General: Skin is warm and dry.      Findings: No rash.   Neurological:      Mental Status: She is alert and oriented to person, place, and time. She is not disoriented.      GCS: GCS eye subscore is 4. GCS verbal subscore is 5. GCS motor subscore is 6.             Lab Results from today's visit:  No results found for this or any previous visit (from the past 12 hour(s)).    Radiology Results from today's visit:  XR Chest 2 Views    Result Date: 03/08/2024  Mild left basilar atelectasis or inflammation. ReadingStation:[email or web address]     Assessment and Plan:       Ellen Dillon was seen today for cough.    Diagnoses and all orders for this visit:    Cough, unspecified type  -     XR Chest 2 Views; Future  -     predniSONE  (DELTASONE ) 20 MG tablet; Take 2 tablets (40 mg) by mouth daily for 5 days  -     albuterol  sulfate HFA (PROVENTIL ) 108 (90 Base) MCG/ACT inhaler; Inhale 2 puffs into the lungs every 6 (six) hours as needed for Wheezing or Shortness of Breath (cough)  -     azithromycin  (ZITHROMAX ) 250 MG tablet; Take 2 tablets (500 mg) by mouth daily for 1 day, THEN 1 tablet (250 mg) daily for 4 days.            I ordered and reviewed the following tests and/or treatments:    -Chest xray: Mild left  basilar atelectasis or inflammation.     Discussed bronchitis vs pneumonia. D/t smoking status recommend ABX and steroid. She is agreeable.    Discussed to go to the ED if worsening shortness of breath, fever, dehydration, etc.    Follow up with PCP in the next several days.    -Z pack.  -Albuterol  every 4-6 hours as needed for cough, shortness of breath, or wheezing.  -Steroid burst for inflammation. Denies hx of DM, CV, GI issues.    -Delsym or Mucinex PRN cough.  -Take Tylenol  or Ibuprofen  every 4-6 hours as needed for pain or fever.  -Drink lots of water, monitor PO intake and urine output for hydration.    Go to the ER for any new or worsening symptoms that concern you.  Follow-up with your primary care doctor or return to Urgent Care if your symptoms do not improve.    Patient agrees with the plan.          Leocadia Rains, NP  [facility]  03/08/2024  5:44 PM

## 2024-05-01 ENCOUNTER — Emergency Department
Admission: EM | Admit: 2024-05-01 | Discharge: 2024-05-01 | Disposition: A | Discharge status: Home / Self Care | Attending: Emergency Medicine | Admitting: Emergency Medicine

## 2024-05-01 ENCOUNTER — Ambulatory Visit (INDEPENDENT_AMBULATORY_CARE_PROVIDER_SITE_OTHER): Payer: Self-pay

## 2024-05-01 ENCOUNTER — Emergency Department (HOSPITAL_COMMUNITY)

## 2024-05-01 ENCOUNTER — Other Ambulatory Visit: Payer: Self-pay

## 2024-05-01 DIAGNOSIS — R079 Chest pain, unspecified: Secondary | ICD-10-CM

## 2024-05-01 DIAGNOSIS — J441 Chronic obstructive pulmonary disease with (acute) exacerbation: Secondary | ICD-10-CM | POA: Insufficient documentation

## 2024-05-01 DIAGNOSIS — F17210 Nicotine dependence, cigarettes, uncomplicated: Secondary | ICD-10-CM | POA: Insufficient documentation

## 2024-05-01 DIAGNOSIS — F1910 Other psychoactive substance abuse, uncomplicated: Secondary | ICD-10-CM | POA: Insufficient documentation

## 2024-05-01 LAB — COMPREHENSIVE METABOLIC PANEL, NON-FASTING
ALBUMIN: 3.6 g/dL (ref 3.5–5.0)
ALKALINE PHOSPHATASE: 94 U/L (ref 40–110)
ALT (SGPT): 14 U/L (ref <31)
ANION GAP: 8 mmol/L (ref 4–13)
AST (SGOT): 20 U/L (ref 11–34)
BILIRUBIN TOTAL: 0.3 mg/dL (ref 0.3–1.3)
BUN/CREA RATIO: 14 (ref 6–22)
BUN: 9 mg/dL (ref 8–25)
CALCIUM: 8.7 mg/dL (ref 8.6–10.2)
CHLORIDE: 107 mmol/L (ref 96–111)
CO2 TOTAL: 25 mmol/L (ref 22–30)
CREATININE: 0.65 mg/dL (ref 0.60–1.05)
ESTIMATED GFR - FEMALE: >90 mL/min/BSA (ref >=60)
GLUCOSE: 88 mg/dL (ref 65–125)
POTASSIUM: 3.7 mmol/L (ref 3.5–5.1)
PROTEIN TOTAL: 7 g/dL (ref 6.4–8.3)
SODIUM: 140 mmol/L (ref 136–145)

## 2024-05-01 LAB — CBC WITH DIFF
BASOPHIL #: <0.1 10*3/uL (ref <=0.20)
BASOPHIL %: 0.9 %
EOSINOPHIL #: 0.1 10*3/uL (ref <=0.50)
EOSINOPHIL %: 1.4 %
HCT: 37.9 % (ref 34.8–46.0)
HGB: 12.2 g/dL (ref 11.5–16.0)
IMMATURE GRANULOCYTE #: <0.1 10*3/uL (ref <0.10)
IMMATURE GRANULOCYTE %: 0.4 % (ref 0.0–1.0)
LYMPHOCYTE #: 2.05 10*3/uL (ref 1.00–4.80)
LYMPHOCYTE %: 29.2 %
MCH: 22.3 pg — ABNORMAL LOW (ref 26.0–32.0)
MCHC: 32.2 g/dL (ref 31.0–35.5)
MCV: 69.2 fL — ABNORMAL LOW (ref 78.0–100.0)
MONOCYTE #: 0.55 10*3/uL (ref 0.20–1.10)
MONOCYTE %: 7.8 %
MPV: 11.5 fL (ref 8.7–12.5)
NEUTROPHIL #: 4.23 10*3/uL (ref 1.50–7.70)
NEUTROPHIL %: 60.3 %
PLATELETS: 398 10*3/uL (ref 150–400)
RBC: 5.48 10*6/uL — ABNORMAL HIGH (ref 3.85–5.22)
RDW-CV: 15.4 % (ref 11.5–15.5)
WBC: 7 10*3/uL (ref 3.7–11.0)

## 2024-05-01 LAB — PT/INR
INR: 0.96
PROTHROMBIN TIME: 11 s (ref 9.2–12.3)

## 2024-05-01 LAB — B-TYPE NATRIURETIC PEPTIDE (BNP),PLASMA: BNP: 31 pg/mL (ref 0–100)

## 2024-05-01 LAB — TROPONIN-I: TROPONIN-I HS: <2.7 ng/L (ref <=14.0)

## 2024-05-01 LAB — PTT (PARTIAL THROMBOPLASTIN TIME): APTT: 28.4 s (ref 25.0–36.8)

## 2024-05-01 LAB — MORPHOLOGY

## 2024-05-01 MED ORDER — SODIUM CHLORIDE 0.9% FLUSH BAG - 250 ML
INTRAVENOUS | Status: DC | PRN
Start: 2024-05-01 — End: 2024-05-01

## 2024-05-01 MED ORDER — SODIUM CHLORIDE 0.9 % (FLUSH) INJECTION SYRINGE
10.0000 mL | INJECTION | INTRAMUSCULAR | Status: DC | PRN
Start: 2024-05-01 — End: 2024-05-01

## 2024-05-01 MED ORDER — ALBUTEROL SULFATE HFA 90 MCG/ACTUATION AEROSOL INHALER
1.0000 | INHALATION_SPRAY | Freq: Four times a day (QID) | RESPIRATORY_TRACT | 0 refills | Status: AC | PRN
Start: 2024-05-01 — End: 2024-05-31

## 2024-05-01 MED ORDER — DEXTROSE 5% IN WATER (D5W) FLUSH BAG - 250 ML
INTRAVENOUS | Status: DC | PRN
Start: 2024-05-01 — End: 2024-05-01

## 2024-05-01 MED ORDER — METHYLPREDNISOLONE SOD SUCC 125 MG SOLUTION FOR INJECTION WRAPPER
125.0000 mg | INTRAVENOUS | Status: AC
Start: 2024-05-01 — End: 2024-05-01
  Administered 2024-05-01: 125 mg via INTRAVENOUS
  Filled 2024-05-01: qty 2

## 2024-05-01 MED ORDER — SODIUM CHLORIDE 0.9 % (FLUSH) INJECTION SYRINGE
2.5000 mL | INJECTION | Freq: Three times a day (TID) | INTRAMUSCULAR | Status: DC
Start: 2024-05-01 — End: 2024-05-01

## 2024-05-01 MED ORDER — IPRATROPIUM 0.5 MG-ALBUTEROL 3 MG (2.5 MG BASE)/3 ML NEBULIZATION SOLN
3.0000 mL | INHALATION_SOLUTION | Freq: Once | RESPIRATORY_TRACT | Status: AC
Start: 2024-05-01 — End: 2024-05-01
  Administered 2024-05-01: 3 mL via RESPIRATORY_TRACT
  Filled 2024-05-01: qty 3

## 2024-05-01 MED ORDER — FLUTICASONE 250 MCG-SALMETEROL 50 MCG/DOSE BLISTR POWDR FOR INHALATION
1.0000 | DISK | Freq: Two times a day (BID) | RESPIRATORY_TRACT | 0 refills | Status: AC
Start: 2024-05-01 — End: 2024-05-31

## 2024-05-01 MED ORDER — PREDNISONE 20 MG TABLET
40.0000 mg | ORAL_TABLET | Freq: Every day | ORAL | 0 refills | Status: AC
Start: 2024-05-01 — End: 2024-05-06

## 2024-05-01 NOTE — ED Nurses Note (Signed)
 Respiratory called for neb treatment.

## 2024-05-01 NOTE — ED Notes (Signed)
 Peer met with patient to do initial BI. Peer focused on reducing use and other harm reduction suggestions.

## 2024-05-01 NOTE — ED Nurses Note (Signed)
 Patient's family asked for update. Informed patient that lab results are still pending. Refused blanket, pt resting comfortably.

## 2024-05-01 NOTE — ED Nurses Note (Signed)
 Patient discharged home with family.  AVS reviewed with patient/care giver.  A written copy of the AVS and discharge instructions was given to the patient/care giver.  Questions sufficiently answered as needed.  Patient/care giver encouraged to follow up with PCP as indicated.  In the event of an emergency, patient/care giver instructed to call 911 or go to the nearest emergency room.  No further complaints at DC.

## 2024-05-01 NOTE — Discharge Instructions (Signed)
 You were seen and evaluated for chest pain. I believe the chest pain is likely related to underlying COPD and you were treated for a flare, and will continue treatment for this. It is imperative you reconsider smoking, and other bad habits. You will need to follow up with a PCP to have an outpatient stress test, along with a lung doctor to see the nature of your lung disease. You will be discharged with prednisone , and I will prescribe you albuterol , as well as a steroid inhaler. If you have ongoing chest pain, please return promptly to the ED.

## 2024-05-02 ENCOUNTER — Ambulatory Visit: Payer: Self-pay

## 2024-05-02 ENCOUNTER — Encounter (INDEPENDENT_AMBULATORY_CARE_PROVIDER_SITE_OTHER): Payer: Self-pay

## 2024-05-02 ENCOUNTER — Encounter (HOSPITAL_COMMUNITY): Payer: Self-pay

## 2024-05-02 VITALS — BP 144/84 | HR 70 | Temp 98.1°F | Ht 68.0 in | Wt 193.8 lb

## 2024-05-02 DIAGNOSIS — Z1321 Encounter for screening for nutritional disorder: Secondary | ICD-10-CM | POA: Insufficient documentation

## 2024-05-02 DIAGNOSIS — Z131 Encounter for screening for diabetes mellitus: Secondary | ICD-10-CM | POA: Insufficient documentation

## 2024-05-02 DIAGNOSIS — Z130 Encounter for screening for diseases of the blood and blood-forming organs and certain disorders involving the immune mechanism: Secondary | ICD-10-CM | POA: Insufficient documentation

## 2024-05-02 DIAGNOSIS — Z13228 Encounter for screening for other metabolic disorders: Secondary | ICD-10-CM | POA: Insufficient documentation

## 2024-05-02 DIAGNOSIS — G8929 Other chronic pain: Secondary | ICD-10-CM | POA: Insufficient documentation

## 2024-05-02 DIAGNOSIS — Z13220 Encounter for screening for lipoid disorders: Secondary | ICD-10-CM | POA: Insufficient documentation

## 2024-05-02 DIAGNOSIS — R718 Other abnormality of red blood cells: Secondary | ICD-10-CM | POA: Insufficient documentation

## 2024-05-02 DIAGNOSIS — M545 Low back pain, unspecified: Secondary | ICD-10-CM | POA: Insufficient documentation

## 2024-05-02 DIAGNOSIS — R7303 Prediabetes: Secondary | ICD-10-CM | POA: Insufficient documentation

## 2024-05-02 DIAGNOSIS — Z1329 Encounter for screening for other suspected endocrine disorder: Secondary | ICD-10-CM | POA: Insufficient documentation

## 2024-05-02 DIAGNOSIS — Z0000 Encounter for general adult medical examination without abnormal findings: Secondary | ICD-10-CM | POA: Insufficient documentation

## 2024-05-02 DIAGNOSIS — Z1231 Encounter for screening mammogram for malignant neoplasm of breast: Secondary | ICD-10-CM | POA: Insufficient documentation

## 2024-05-02 DIAGNOSIS — F411 Generalized anxiety disorder: Secondary | ICD-10-CM | POA: Insufficient documentation

## 2024-05-02 DIAGNOSIS — R030 Elevated blood-pressure reading, without diagnosis of hypertension: Secondary | ICD-10-CM | POA: Insufficient documentation

## 2024-05-02 MED ORDER — HYDROXYZINE HCL 25 MG TABLET
25.0000 mg | ORAL_TABLET | Freq: Three times a day (TID) | ORAL | 1 refills | Status: DC | PRN
Start: 2024-05-02 — End: 2024-07-17

## 2024-05-02 NOTE — Nursing Note (Signed)
 05/02/24 1348   Depression Screen   Little interest or pleasure in doing things. 0   Feeling down, depressed, or hopeless 0   PHQ 2 Total 0

## 2024-05-02 NOTE — Nursing Note (Signed)
 Population Health ED follow up call deferred. Patient followed up today with PCP re: ED visit.     Rebbecca Campion RN  [employer]

## 2024-05-02 NOTE — Nursing Note (Signed)
 Chief Complaint:   Chief Complaint              New Patient           Functional Health Screen        Vital Signs  BP (!) 156/90   Pulse 70   Temp 36.7 C (98.1 F)   Ht 1.727 m (5\' 8" )   Wt 87.9 kg (193 lb 12.8 oz)   LMP 04/29/2024   SpO2 99%   BMI 29.47 kg/m       Social History     Tobacco Use   Smoking Status Every Day    Current packs/day: 0.50    Average packs/day: 0.5 packs/day for 14.0 years (7.0 ttl pk-yrs)    Types: Cigarettes   Smokeless Tobacco Never     Allergies  Allergies   Allergen Reactions    Penicillins      HIVES    Propoxyphene      HIVES    Sulfa (Sulfonamides)      HIVES    Suboxone  [Buprenorphine -Naloxone ]  Other Adverse Reaction (Add comment)     **Only the strips**  Gives stroke level bp    Tramadol Swelling     Medication History  Reviewed for OTC medication and any new medications, provider will review medication history  Care Team  Patient Care Team:  Durel Gilbert, NP as PCP - General (FAMILY NURSE PRACTITIONER)  Immunizations - last 24 hours       None          Stanford Earl, MA  05/02/2024, 13:48

## 2024-05-02 NOTE — Progress Notes (Unsigned)
 FAMILY MEDICINE, [facility] OFFICE BUILDING  [address]  [place] [address]  Operated by [facility]      Name: Ellen Dillon  DOB:   [birth year]  Age:    43 y.o.  MRN:   [id number]  Date:   05/02/2024  Chief Complaint:   Chief Complaint   Patient presents with    New Patient     History of Present Illness:    Ellen Dillon is a 43 y.o. female presenting today as a new patient to me to establish care. Ellen Dillon did arrive 10 minutes late for this appointment.     Ellen Dillon has concerns today:    Reports she has been getting really overwhelmed. Yesterday she got overwhelmed and started having chest pain. Went to ER was Dx'd with Chronic Obstructive Pulmonary Disease started on steroids and an inhaler. Reports she noticed after after her baby 4 years ago her anxiety has been worse. Reports she was previously prescribed Wellbutrin and Paxil. Reports when she stopped both of these meds she was "crazy" suicidal. Ellen Dillon reports she does not want to take daily medications would rather take something as needed.     Reports her blood pressure has been running high for several years. Has never been prescribed medication to control blood pressure.     Reports she has experienced lower back pain since an epidural about 4 years ago. Shows me back pain is across both sides of lower back.     Labs from as far back as 2020 show elevated RBC. Pt has never been told she has blood cell concerns.     Labs from 06/2019 show A1C of 6.0%.     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    Overweight(278.02)     Panic attack     Seizure (CMS [facility])     age 48     Tobacco smoking complicating pregnancy, third trimester 02/27/2019     Past Surgical History:   Procedure Laterality Date    HX BREAST BIOPSY      x3 cyst     HX CYST INCISION AND DRAINAGE  LEFT    HX DILATION AND CURETTAGE  01/04/12     Dilation and Curettage suction      Allergies   Allergen Reactions    Penicillins      HIVES    Propoxyphene      HIVES    Sulfa (Sulfonamides)      HIVES    Suboxone  [Buprenorphine -Naloxone ]  Other Adverse Reaction (Add comment)     **Only the strips**  Gives stroke level bp    Tramadol Swelling     Current Outpatient Medications   Medication Sig    albuterol  sulfate (PROVENTIL  OR VENTOLIN  OR PROAIR ) 90 mcg/actuation Inhalation oral inhaler Take 1-2 Puffs by  inhalation Every 6 hours as needed for up to 30 days    buprenorphine -naloxone  (SUBOXONE ) 8-2 mg Sublingual Tablet, Sublingual     fluticasone  propion-salmeteroL (ADVAIR) 250-50 mcg/dose Inhalation oral diskus inhaler Take 1 Puff (1 Inhalation total) by inhalation Twice daily for 30 days    hydrOXYzine  HCL (ATARAX ) 25 mg Oral Tablet Take 1 Tablet (25 mg total) by mouth Every 8 hours as needed for Itching    lidocaine  (LIDODERM ) 5 % Adhesive Patch, Medicated PLACE ONE (1) PATCH ON THE SKIN ONCE A DAY FOR 14 DAYS    naloxone  (NARCAN ) 4 mg per spray nasal spray 1 Spray by INTRANASAL route Every 2 minutes as needed (for overdose)    predniSONE  (DELTASONE ) 20 mg Oral Tablet Take 2 Tablets (40 mg total) by mouth Daily for 5 days     Family Medical History:       Problem Relation (Age of Onset)    Diabetes Maternal Grandmother, Maternal Grandfather    Healthy Father, Mother, Brother          Social History     Socioeconomic History    Marital status: Single     Number of children: 0    Years of education: 12.5   Tobacco Use    Smoking status: Every Day     Current packs/day: 0.50     Average packs/day: 0.5 packs/day for 14.0 years (7.0 ttl pk-yrs)     Types: Cigarettes    Smokeless tobacco: Never   Vaping Use    Vaping status: Never Used   Substance and Sexual Activity    Alcohol use: Yes    Drug use: No     Types: Other     Comment: denies/history of opiate pills and cocaine     Sexual activity: Yes     Partners: Male   Other Topics Concern    Ability to Walk 2 Flight of Steps without SOB/CP Yes    Ability To Do Own ADL's Yes     Review of Systems:  All pertinent review of systems as addressed and detailed in HPI.    Physical Exam:  Vitals:    05/02/24 1345 05/02/24 1416   BP: (!) 156/90 (!) 144/84   Pulse: 70    Temp: 36.7 C (98.1 F)    SpO2: 99%    Weight: 87.9 kg (193 lb 12.8 oz)    Height: 1.727 m (5\' 8" )    BMI: 29.47      General: well-developed patient, who appears stated age in no acute distress  Psych: very anxious  Neurologic: alert and oriented x3, normal gait  Eyes: pupils equal, round, reactive to light, and accomodation, conjunctivae/corneas clear  HENT: normocephalic and atraumatic. Bilateral TM pearlescent. Nose without erythema polyps, or rhinorrhea. No sinus tenderness. Moist mucous membranes moist, pharynx without exudate or oral lesions  Neck: supple, no adenopathy  Respiratory: clear to auscultation bilaterally, no rales or wheezing and unlabored breathing.   Cardiovascular: regular rate and rhythm, S1 S2 normal, no murmur, click, rub or gallop, pulses palpable, no pedal edema  Gastrointestinal: normal bowel sounds, soft, and non-tender abdomen. No organomegaly or masses palpated, no hernia.   Skin: skin color, texture, turgor normal. No rashes or lesions    Recent Testing:    Assessment and Plan:    ICD-10-CM    1. Encounter for medical examination to establish care  Z00.00       2. GAD (generalized anxiety disorder)  F41.1 THYROID  STIMULATING  HORMONE WITH FREE  T4 REFLEX      3. Elevated blood pressure reading  R03.0 THYROID  STIMULATING HORMONE WITH FREE T4 REFLEX      4. Chronic lower back pain  M54.50 Refer to Physical Therapy-External    G89.29       5. Prediabetes  R73.03 HGA1C (HEMOGLOBIN A1C WITH EST AVG GLUCOSE)      6. Elevated red blood cell count  R71.8       7. Screening cholesterol level  Z13.220 LIPID PANEL      8. Screening for thyroid  disorder  Z13.29       9. Screening for endocrine, nutritional, metabolic and immunity disorder  Z13.29 COMPREHENSIVE METABOLIC PNL, FASTING    Z13.21     Z13.228     Z13.0       10. Screening for diabetes mellitus (DM)  Z13.1 COMPREHENSIVE METABOLIC PNL, FASTING      11. Breast cancer screening by mammogram  Z12.31 MAMMO BILATERAL SCREENING-ADDL VIEWS/BREAST US  AS REQ BY RAD        Screening labs ordered.     Anxiety   - Start Hydroxyzine  25 mg TID PRN   - Discussed if she is taking Hydroxyzine  several times a day for several days a week, likely needs daily medication - Glenyce will let inform office of use   - Discussed therapy    Chronic Low Back Pain   - Referred to PT Odilia Bennett to call insurance company to understand where she can go for PT)   - Consider referral to spine specialist if no improvement with PT    Elevated Blood Pressure Reading   - Repeat manual BP reading improved   - Obtain home BP monitor, monitor BP daily, keep log for 2-3 weeks and bring log to office/upload via MyChart for my review   - Review red flag symptoms that necessitate an ER visit     Prediabetes   - Previous labs show elevated A1C of 6.0%   - A1C ordered  - Encourage healthy diet (high lean protein (chicken/fish), low carbohydrate & low to moderate healthy fats, decreasing saturated & trans fats, increasing soluble fibers, fresh fruits & vegetables) & exercise (recommendation is 150 minutes per week of moderate intensity aerobic exercise).    Elevated Red Blood Cell Count   - > 5 yr h/o elevated RBC   - Labs ordered    Care  Gaps:  Mammo: ordered   Pap Smear/HPV: 2020 - needs to schedule  Colon Ca Screen: Not age indicated.   Vaccines: Educated.     Odilia Bennett [employer] voiced understanding of the current plan and when to RTC.    Orders Placed This Encounter    MAMMO BILATERAL SCREENING-ADDL VIEWS/BREAST US  AS REQ BY RAD    THYROID  STIMULATING HORMONE WITH FREE T4 REFLEX    COMPREHENSIVE METABOLIC PNL, FASTING    HGA1C (HEMOGLOBIN A1C WITH EST AVG GLUCOSE)    LIPID PANEL    Refer to Physical Therapy-External    hydrOXYzine  HCL (ATARAX ) 25 mg Oral Tablet     Halia Franey, NP    Portions of this note may be dictated using voice recognition software or a dictation service. Variances in spelling and vocabulary are possible and unintentional. Not all errors are caught/corrected. Please notify the [facility] if any discrepancies are noted or if the meaning of any statement is not clear.

## 2024-05-02 NOTE — Patient Instructions (Addendum)
 Check BP daily at home, Keep log X 2-3 weeks. Bring blood pressure readings to my office for me to see.    Inhalers: Advair 1 puff morning and night. Albuterol  inhaler use as needed 1-2 puffs every 6 hours. If after starting the Advair you are using the Albuterol  more than 3 times a week, please let me know.     If you are using the as needed anxiety medication more than 2 X a day for more an 4 days a week, please let me know.     Blood work in 4 weeks (nothing to eat or drink 8-10 hours prior to blood work).    Call your insurance company to see where you can do for physical therapy for your back.

## 2024-05-02 NOTE — Nursing Note (Signed)
 05/02/24 1348   Recent Weight Change   Have you had a recent unexplained weight loss or gain? N   Health Education and Literacy   How often do you have a problem understanding what is told to you about your medical condition?  Never   Domestic Violence   Because we are aware of abuse and domestic violence today, we ask all patients: Are you being hurt, hit, or frightened by anyone at your home or in your life?  N   Basic Needs   Do you have any basic needs within your home that are not being met? (such as Food, Shelter, [employer] fast streamer, Tranportation, paying for bills and/or medications) N

## 2024-05-03 LAB — ECG 12-LEAD
Atrial Rate: 55 {beats}/min
Calculated P Axis: 57 degrees
Calculated R Axis: 34 degrees
Calculated T Axis: 36 degrees
PR Interval: 180 ms
QRS Duration: 90 ms
QT Interval: 422 ms
QTC Calculation: 403 ms
Ventricular rate: 55 {beats}/min

## 2024-05-03 NOTE — ED Provider Notes (Signed)
 Ellen Revere, MD  [facility] - Emergency Department  ED Primary Note        Chief Complaint   Patient presents with    Chest Pain      C/o chest pain that began 1 hours PTA while helping her daughter get ready. Patient reports some SOB with it. No distress noted. Denies N/V or diaphoresis. Denies cardiac history. Reports stress. Recently started school 2 weeks ago.            HISTORY OF PRESENT ILLNESS     Ellen Dillon, date of birth [birth year], is a 43 y.o.female who presents to the Emergency Department for dyspnea while helping daughter. Denies Chest pain, HA, NVD, fever chills, or other alarm symptoms.                                                    PATIENT HISTORY     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    Overweight(278.02)     Panic attack     Seizure (CMS [facility])     age 43     Tobacco smoking complicating pregnancy, third trimester 02/27/2019       Past Surgical History:  Past Surgical History:   Procedure Laterality Date    Hx breast biopsy      Hx cyst incision and drainage  LEFT    Hx dilation and curettage  01/04/12        Family History:  Family Medical History:       Problem Relation (Age of Onset)    Diabetes Maternal Grandmother, Maternal Grandfather    Healthy Father, Mother, Brother              Social History:  Social History     Tobacco Use    Smoking status: Every Day     Current packs/day: 0.50     Average packs/day: 0.5 packs/day for 14.0  years (7.0 ttl pk-yrs)     Types: Cigarettes    Smokeless tobacco: Never   Vaping Use    Vaping status: Never Used   Substance Use Topics    Alcohol use: Yes    Drug use: No     Types: Other     Comment: denies/history of opiate pills and cocaine        Medications:  Current Outpatient Medications   Medication Sig    albuterol  sulfate (PROVENTIL  OR VENTOLIN  OR PROAIR ) 90 mcg/actuation Inhalation oral inhaler Take 1-2 Puffs by inhalation Every 6 hours as needed for up to 30 days    buprenorphine -naloxone  (SUBOXONE ) 8-2 mg Sublingual Tablet, Sublingual     fluticasone  propion-salmeteroL (ADVAIR) 250-50 mcg/dose Inhalation oral diskus inhaler Take 1 Puff (1 Inhalation total) by inhalation Twice daily for 30 days  hydrOXYzine  HCL (ATARAX ) 25 mg Oral Tablet Take 1 Tablet (25 mg total) by mouth Every 8 hours as needed for Itching    lidocaine  (LIDODERM ) 5 % Adhesive Patch, Medicated PLACE ONE (1) PATCH ON THE SKIN ONCE A DAY FOR 14 DAYS    naloxone  (NARCAN ) 4 mg per spray nasal spray 1 Spray by INTRANASAL route Every 2 minutes as needed (for overdose)    predniSONE  (DELTASONE ) 20 mg Oral Tablet Take 2 Tablets (40 mg total) by mouth Daily for 5 days       Allergies:  Allergies   Allergen Reactions    Penicillins      HIVES    Propoxyphene      HIVES    Sulfa (Sulfonamides)      HIVES    Suboxone  [Buprenorphine -Naloxone ]  Other Adverse Reaction (Add comment)     **Only the strips**  Gives stroke level bp    Tramadol Swelling                                                      PHYSICAL EXAM     Vitals:  ED Triage Vitals [05/01/24 0925]   BP (Non-Invasive) (!) 178/92   Heart Rate 60   Respiratory Rate 18   Temperature 36.9 C (98.4 F)   SpO2 98 %   Weight 86.2 kg (190 lb)   Height 1.753 m (5\' 9" )     Physical Exam  Vitals and nursing note reviewed.   Constitutional:       Appearance: Normal appearance.   HENT:      Head: Normocephalic and atraumatic.      Mouth/Throat:      Mouth: Mucous membranes are moist.   Eyes:       Extraocular Movements: Extraocular movements intact.      Conjunctiva/sclera: Conjunctivae normal.   Cardiovascular:      Rate and Rhythm: Normal rate and regular rhythm.   Pulmonary:      Effort: Pulmonary effort is normal. No respiratory distress.      Breath sounds: Wheezing and rhonchi present.   Abdominal:      General: Abdomen is flat. Bowel sounds are normal.      Palpations: Abdomen is soft.   Musculoskeletal:         General: Normal range of motion.   Skin:     General: Skin is warm and dry.      Capillary Refill: Capillary refill takes less than 2 seconds.   Neurological:      General: No focal deficit present.      Mental Status: She is alert and oriented to person, place, and time. Mental status is at baseline.   Psychiatric:      Comments: Very tearful on exam.                                                       DIAGNOSTIC STUDIES     Labs:    Results for orders placed or performed during the hospital encounter of 05/01/24   B-TYPE NATRIURETIC PEPTIDE   Result Value Ref Range    BNP 31 0 - 100 pg/mL  COMPREHENSIVE METABOLIC PANEL, NON-FASTING   Result Value Ref Range    SODIUM 140 136 - 145 mmol/L    POTASSIUM 3.7 3.5 - 5.1 mmol/L    CHLORIDE 107 96 - 111 mmol/L    CO2 TOTAL 25 22 - 30 mmol/L    ANION GAP 8 4 - 13 mmol/L    BUN 9 8 - 25 mg/dL    CREATININE 1.61 0.96 - 1.05 mg/dL    BUN/CREA RATIO 14 6 - 22    ALBUMIN 3.6 3.5 - 5.0 g/dL     CALCIUM  8.7 8.6 - 10.2 mg/dL    GLUCOSE 88 65 - 045 mg/dL    ALKALINE PHOSPHATASE 94 40 - 110 U/L    ALT (SGPT) 14 <31 U/L    AST (SGOT)  20 11 - 34 U/L    BILIRUBIN TOTAL 0.3 0.3 - 1.3 mg/dL    PROTEIN TOTAL 7.0 6.4 - 8.3 g/dL    ESTIMATED GFR - FEMALE >90 >=60 mL/min/BSA   TROPONIN-I   Result Value Ref Range    TROPONIN-I HS <2.7 <=14.0 ng/L ng/L   PTT (PARTIAL THROMBOPLASTIN TIME)   Result Value Ref Range    APTT 28.4 25.0 - 36.8 seconds   PT/INR   Result Value Ref Range    PROTHROMBIN TIME 11.0 9.2 - 12.3 seconds    INR 0.96    CBC WITH DIFF   Result Value Ref  Range    WBC 7.0 3.7 - 11.0 x10^3/uL    RBC 5.48 (H) 3.85 - 5.22 x10^6/uL    HGB 12.2 11.5 - 16.0 g/dL    HCT 40.9 81.1 - 91.4 %    MCV 69.2 (L) 78.0 - 100.0 fL    MCH 22.3 (L) 26.0 - 32.0 pg    MCHC 32.2 31.0 - 35.5 g/dL    RDW-CV 78.2 95.6 - 21.3 %    PLATELETS 398 150 - 400 x10^3/uL    MPV 11.5 8.7 - 12.5 fL    NEUTROPHIL % 60.3 %    LYMPHOCYTE % 29.2 %    MONOCYTE % 7.8 %    EOSINOPHIL % 1.4 %    BASOPHIL % 0.9 %    NEUTROPHIL # 4.23 1.50 - 7.70 x10^3/uL    LYMPHOCYTE # 2.05 1.00 - 4.80 x10^3/uL    MONOCYTE # 0.55 0.20 - 1.10 x10^3/uL    EOSINOPHIL # 0.10 <=0.50 x10^3/uL    BASOPHIL # <0.10 <=0.20 x10^3/uL    IMMATURE GRANULOCYTE % 0.4 0.0 - 1.0 %    IMMATURE GRANULOCYTE # <0.10 <0.10 x10^3/uL   MORPHOLOGY   Result Value Ref Range    HYPOCHROMASIA 2+/Moderate (A) None   ECG 12-LEAD   Result Value Ref Range    Ventricular rate 55 BPM    Atrial Rate 55 BPM    PR Interval 180 ms    QRS Duration 90 ms    QT Interval 422 ms    QTC Calculation 403 ms    Calculated P Axis 57 degrees    Calculated R Axis 34 degrees    Calculated T Axis 36 degrees     Labs reviewed and interpreted by me.    Radiology:    XR AP MOBILE CHEST   Final Result   No evidence of acute cardiopulmonary disease.         Radiologist location ID: [id number]             Real-time EKG Interpretation: 12 Lead EKG interpreted by me shows  sinus rhythm, rate of 56 bpm with normal intervals, normal axis deviation, good R wave progression, non-specific ST-T wave changes.                                       MEDICAL DECISION MAKING     Medications Ordered/Administered in the ED   methylPREDNISolone  sod succ (SOLU-medrol ) 125 mg/2 mL injection (125 mg Intravenous Given 05/01/24 1217)   ipratropium-albuterol  0.5 mg-3 mg(2.5 mg base)/3 mL Solution for Nebulization (3 mL Nebulization Given 05/01/24 1227)             Medical Decision Making  42yoF presenting with dyspnea on exertion. + nicotine  with 1PPD. PE with ++ wheezing and rhonci. Not requiring O2. Cardiac  workup largely benign. Feels significant improvement after empiric treatment of COPD. Given significant treatment resolution, believes patient has underlying COPD and is currently in flare. Patient has additional high risk features including active polysubstance use, which needs to be addressed. Patient refused crisis evaluation for possible treatment options. Will continue COPD treatment as outpatient, and requested patient have PCP evaluation for stress test as outpatient.      Amount and/or Complexity of Data Reviewed  Labs: ordered.  Radiology: ordered.  ECG/medicine tests: ordered.    Risk  Prescription drug management.  Risk Details: I have discussed the findings of today's assessment and the plan with the patient and they state that they understand.  The Pt is stable and appropriate for discharge home  with outpatient management and PCP follow-up. Vital signs stable and the physical exam is benign.  Warning signs for return were discussed at length. After taking into careful account the historical factors and physical exam findings of the patient's presentation today, in conjunction with the empirical and objective data obtained on ED workup, no acute emergent medical condition has been identified. The patient appears to be low risk for an emergent medical condition and I feel it is safe and appropriate at this time for the patient to be discharged to follow-up as detailed in their discharge instructions for reevaluation and possible continued outpatient workup and management. Regardless, an unremarkable evaluation in the ED does not preclude the development or presence of a serious or life threatening condition. As such, the patient was instructed to return immediately for any worsening or change in current symptoms. Precautions for return discussed at length. Discharge and follow-up instructions discussed with the patient who expressed understanding and willingness to follow my recommendations. This record  was completed using an electronic medical record and dictation software. It may contain unintended, unedited transcription errors.                   Pre-Disposition Vitals:  Filed Vitals:    05/01/24 1215 05/01/24 1229 05/01/24 1230 05/01/24 1245   BP: (!) 170/91  (!) 145/90    Pulse: 54  52 57   Resp: 16  (!) 11 13   Temp:       SpO2:  99% 100% 98%                                               CLINICAL IMPRESSION     Clinical Impression   Acute exacerbation of chronic obstructive pulmonary disease (COPD) (CMS HCC) (Primary)   Chest pain,  unspecified                                                  DISPOSITION PLAN     Discharged    Prescriptions:     Discharge Medication List as of 05/01/2024 12:39 PM        START taking these medications    Details   albuterol  sulfate (PROVENTIL  OR VENTOLIN  OR PROAIR ) 90 mcg/actuation Inhalation oral inhaler Take 1-2 Puffs by inhalation Every 6 hours as needed for up to 30 days, Disp-1 Each, R-0, E-Rx      fluticasone  propion-salmeteroL (ADVAIR) 250-50 mcg/dose Inhalation oral diskus inhaler Take 1 Puff (1 Inhalation total) by inhalation Twice daily for 30 days, Disp-1 Each, R-0, E-Rx      predniSONE  (DELTASONE ) 20 mg Oral Tablet Take 2 Tablets (40 mg total) by mouth Daily for 5 days, Disp-10 Tablet, R-0, E-Rx              Follow-Up:     [facility]  [address]  [place]  [address]  [phone]  In 2 days        Condition at Disposition: Stable

## 2024-05-05 ENCOUNTER — Ambulatory Visit

## 2024-05-05 ENCOUNTER — Encounter (INDEPENDENT_AMBULATORY_CARE_PROVIDER_SITE_OTHER): Payer: Self-pay

## 2024-05-05 ENCOUNTER — Encounter (HOSPITAL_COMMUNITY): Payer: Self-pay

## 2024-05-05 ENCOUNTER — Other Ambulatory Visit: Payer: Self-pay

## 2024-05-05 VITALS — BP 160/86

## 2024-05-05 DIAGNOSIS — Z0131 Encounter for examination of blood pressure with abnormal findings: Secondary | ICD-10-CM | POA: Insufficient documentation

## 2024-05-05 DIAGNOSIS — Z0130 Encounter for examination of blood pressure without abnormal findings: Secondary | ICD-10-CM | POA: Insufficient documentation

## 2024-05-05 NOTE — Nursing Note (Signed)
 Patient came in per [facility] to get her BP CHECKED. Patients BP was 168/90. I notified leslie and she stated to let the patient wait 15 minutes and the recheck her BP. The second reading was 160/86 and patient was told to return after a week if BP's were still elevated. She was also informed to keep a log of all of her readings. Gurney Lefort was notified and pt verbalized understanding and will keep us  updated.     Stanford Earl, MA  05/05/2024, 10:13

## 2024-05-08 ENCOUNTER — Encounter (HOSPITAL_COMMUNITY): Payer: Self-pay

## 2024-05-12 ENCOUNTER — Encounter (HOSPITAL_COMMUNITY): Payer: Self-pay

## 2024-05-13 ENCOUNTER — Encounter (INDEPENDENT_AMBULATORY_CARE_PROVIDER_SITE_OTHER): Payer: Self-pay

## 2024-05-21 ENCOUNTER — Encounter: Admit: 2024-05-21 | Discharge: 2024-05-21 | Payer: PRIVATE HEALTH INSURANCE | Primary: Family

## 2024-05-26 ENCOUNTER — Encounter: Admit: 2024-05-26 | Discharge: 2024-05-26 | Payer: PRIVATE HEALTH INSURANCE | Primary: Family

## 2024-06-03 ENCOUNTER — Encounter: Admit: 2024-06-03 | Discharge: 2024-06-03 | Payer: PRIVATE HEALTH INSURANCE | Primary: Family

## 2024-06-04 ENCOUNTER — Encounter: Admit: 2024-06-04 | Discharge: 2024-06-04 | Payer: PRIVATE HEALTH INSURANCE | Primary: Family

## 2024-06-24 ENCOUNTER — Encounter: Admit: 2024-06-24 | Discharge: 2024-06-24 | Payer: PRIVATE HEALTH INSURANCE | Primary: Family

## 2024-06-24 DIAGNOSIS — E785 Hyperlipidemia, unspecified: Principal | ICD-10-CM

## 2024-06-25 ENCOUNTER — Encounter: Admit: 2024-06-25 | Discharge: 2024-06-25 | Payer: PRIVATE HEALTH INSURANCE | Primary: Family

## 2024-06-25 DIAGNOSIS — E785 Hyperlipidemia, unspecified: Principal | ICD-10-CM

## 2024-06-25 LAB — LIPID PROFILE
CHOLESTEROL/HDL %: 5
CHOLESTEROL: 255 — ABNORMAL HIGH (ref <200)
HDL: 54
LDL: 181 — ABNORMAL HIGH (ref <100)
TRIGLYCERIDES: 102
VLDL: 20

## 2024-07-01 ENCOUNTER — Encounter: Admit: 2024-07-01 | Discharge: 2024-07-01 | Payer: PRIVATE HEALTH INSURANCE | Primary: Family

## 2024-07-01 DIAGNOSIS — R002 Palpitations: Secondary | ICD-10-CM

## 2024-07-01 DIAGNOSIS — I959 Hypotension, unspecified: Secondary | ICD-10-CM

## 2024-07-01 DIAGNOSIS — E038 Other specified hypothyroidism: Principal | ICD-10-CM

## 2024-07-01 DIAGNOSIS — R079 Chest pain, unspecified: Secondary | ICD-10-CM

## 2024-07-01 DIAGNOSIS — Z136 Encounter for screening for cardiovascular disorders: Secondary | ICD-10-CM

## 2024-07-01 LAB — THYROID STIMULATING HORMONE-TSH: TSH: 0.8 (ref 0.350–4.94)

## 2024-07-01 MED ORDER — NEBIVOLOL 10 MG PO TAB
ORAL_TABLET | ORAL | 0 refills | 60.00000 days | Status: AC
Start: 2024-07-01 — End: ?

## 2024-07-01 NOTE — Patient Instructions
 Coronary CT Angiography Instructions    Dorothy Todd  [id number]  [birth year]  07/01/2024    ARRIVAL TIME    Please report to the Cardiovascular Medicine Clinic at the [facility] on: 07/15/24  Please arrive at the following time: 9:15 for 9:30 CCTA      DO NOT EAT FOR 4 HOURS PRIOR TO YOUR PROCEDURE.may have toast or fruit before 7am  YOU SHOULD DRINK PLENTY OF CLEAR LIQUIDS UP TO ARRIVAL AT OFFICE.    Pre-Procedure Heart Rate Medication Instructions: take all AM meds except do NOT take jardiance OR TENORMIN THE AM OF TEST - take bystolic  10mg  on 7/21 @ 7pm and 7/22 @ 7AM (called to requested pharm) It is important to take these medications exactly as written.  These medications prepare you for the procedure.         Do Not Take The Following Oral Diabetic Medications The Morning Of The Procedure: jardiance              Do not take any non-steroidal inflammatory medications, (ibuprofen , Aleve, Advil , etc.) for 48 hours beginning the day of the procedure.                   SPECIAL ALLERGY INSTRUCTIONS         -No caffeine or smoking after midnight before the procedure.  -Please arrange for someone to drive you home after the procedure.  -No driving for 3 hours after the procedure.  -Please leave all valuables at home (wallet/purse).    If you have any questions, please call the [facility] office at [phone].

## 2024-07-03 ENCOUNTER — Encounter: Admit: 2024-07-03 | Discharge: 2024-07-03 | Payer: PRIVATE HEALTH INSURANCE | Primary: Family

## 2024-07-07 ENCOUNTER — Encounter: Admit: 2024-07-07 | Discharge: 2024-07-07 | Payer: PRIVATE HEALTH INSURANCE | Primary: Family

## 2024-07-07 ENCOUNTER — Ambulatory Visit: Admit: 2024-07-07 | Discharge: 2024-07-07 | Payer: PRIVATE HEALTH INSURANCE | Primary: Family

## 2024-07-13 ENCOUNTER — Encounter: Admit: 2024-07-13 | Discharge: 2024-07-13 | Payer: PRIVATE HEALTH INSURANCE | Primary: Family

## 2024-07-15 ENCOUNTER — Ambulatory Visit: Admit: 2024-07-15 | Discharge: 2024-07-15 | Payer: MEDICAID | Primary: Family

## 2024-07-15 ENCOUNTER — Encounter: Admit: 2024-07-15 | Discharge: 2024-07-15 | Payer: PRIVATE HEALTH INSURANCE | Primary: Family

## 2024-07-15 ENCOUNTER — Ambulatory Visit: Admit: 2024-07-15 | Discharge: 2024-07-15 | Payer: PRIVATE HEALTH INSURANCE | Primary: Family

## 2024-07-17 ENCOUNTER — Other Ambulatory Visit (INDEPENDENT_AMBULATORY_CARE_PROVIDER_SITE_OTHER): Payer: Self-pay

## 2024-07-17 DIAGNOSIS — L299 Pruritus, unspecified: Secondary | ICD-10-CM

## 2024-07-17 NOTE — Telephone Encounter (Signed)
 LOV- 05/02/24  NOV- none  Refill - Hydroxyzine   Editor, commissioning

## 2024-07-18 NOTE — Telephone Encounter (Signed)
 Spoke with Rosina.  She states she is taking the medication multiple times a week.  F/u visit scheduled for 8/4. Rea Nine, RN  07/18/2024, 11:14

## 2024-07-28 ENCOUNTER — Encounter: Admit: 2024-07-28 | Discharge: 2024-07-28 | Payer: PRIVATE HEALTH INSURANCE | Primary: Family

## 2024-07-28 ENCOUNTER — Ambulatory Visit (INDEPENDENT_AMBULATORY_CARE_PROVIDER_SITE_OTHER): Payer: Self-pay

## 2024-07-28 NOTE — Telephone Encounter
 Sent mychart message with results and recommendations.

## 2024-07-28 NOTE — Telephone Encounter
-----   Message from Zelda HERO sent at 07/28/2024 11:13 AM CDT -----    ----- Message -----  From: Maurice Elsie DASEN, MD  Sent: 07/28/2024  11:07 AM CDT  To: [facility] Nurse [facility] Card Team [place]    Coronary artery CTA looks great.  Nothing to suggest significant blockage in her heart arteries.  As such I do not think obstructive coronary artery disease is the etiology of her symptoms.  Thanks  ----- Message -----  From: Gareth Ego, In Radiant Results  Sent: 07/15/2024  11:10 AM CDT  To: Elsie DASEN Maurice, MD
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
## 2024-08-20 ENCOUNTER — Ambulatory Visit (INDEPENDENT_AMBULATORY_CARE_PROVIDER_SITE_OTHER): Payer: Self-pay

## 2024-09-03 ENCOUNTER — Ambulatory Visit (INDEPENDENT_AMBULATORY_CARE_PROVIDER_SITE_OTHER): Payer: Self-pay

## 2024-12-22 ENCOUNTER — Encounter: Admit: 2024-12-22 | Discharge: 2024-12-22 | Payer: PRIVATE HEALTH INSURANCE | Primary: Family

## 2024-12-30 ENCOUNTER — Encounter: Admit: 2024-12-30 | Discharge: 2024-12-30 | Payer: PRIVATE HEALTH INSURANCE | Primary: Family

## 2025-01-19 ENCOUNTER — Encounter: Admit: 2025-01-19 | Discharge: 2025-01-19 | Payer: PRIVATE HEALTH INSURANCE | Primary: Family
